# Patient Record
Sex: Female | Born: 1975 | Race: Black or African American | Hispanic: No | Marital: Married | Smoking: Heavy tobacco smoker
Health system: Southern US, Community
[De-identification: ages and names within clinical notes are randomized; demographics above are authoritative.]

## PROBLEM LIST (undated history)

## (undated) DIAGNOSIS — R87629 Unspecified abnormal cytological findings in specimens from vagina: Secondary | ICD-10-CM

## (undated) DIAGNOSIS — I10 Essential (primary) hypertension: Secondary | ICD-10-CM

## (undated) DIAGNOSIS — E785 Hyperlipidemia, unspecified: Secondary | ICD-10-CM

## (undated) DIAGNOSIS — E079 Disorder of thyroid, unspecified: Secondary | ICD-10-CM

## (undated) DIAGNOSIS — N179 Acute kidney failure, unspecified: Secondary | ICD-10-CM

## (undated) DIAGNOSIS — F419 Anxiety disorder, unspecified: Secondary | ICD-10-CM

## (undated) HISTORY — DX: Unspecified abnormal cytological findings in specimens from vagina: R87.629

## (undated) HISTORY — DX: Disorder of thyroid, unspecified: E07.9

## (undated) HISTORY — PX: ECTOPIC PREGNANCY SURGERY: SHX613

## (undated) HISTORY — PX: LEEP: SHX91

## (undated) HISTORY — DX: Anxiety disorder, unspecified: F41.9

## (undated) HISTORY — DX: Essential (primary) hypertension: I10

---

## 2009-11-03 ENCOUNTER — Ambulatory Visit: Payer: Self-pay | Admitting: Diagnostic Radiology

## 2009-11-03 ENCOUNTER — Emergency Department (HOSPITAL_BASED_OUTPATIENT_CLINIC_OR_DEPARTMENT_OTHER): Admission: EM | Admit: 2009-11-03 | Discharge: 2009-11-03 | Payer: Self-pay | Admitting: Emergency Medicine

## 2010-03-24 LAB — URINALYSIS, ROUTINE W REFLEX MICROSCOPIC
Glucose, UA: 100 mg/dL — AB
Specific Gravity, Urine: 1.005 (ref 1.005–1.030)
pH: 6 (ref 5.0–8.0)

## 2010-03-24 LAB — URINE MICROSCOPIC-ADD ON

## 2010-03-24 LAB — CBC
Platelets: 363 10*3/uL (ref 150–400)
RBC: 3.74 MIL/uL — ABNORMAL LOW (ref 3.87–5.11)
RDW: 12.3 % (ref 11.5–15.5)
WBC: 9.9 10*3/uL (ref 4.0–10.5)

## 2010-03-24 LAB — DIFFERENTIAL
Basophils Absolute: 0.1 10*3/uL (ref 0.0–0.1)
Eosinophils Relative: 2 % (ref 0–5)
Lymphocytes Relative: 20 % (ref 12–46)
Lymphs Abs: 2 10*3/uL (ref 0.7–4.0)
Neutrophils Relative %: 69 % (ref 43–77)

## 2010-03-24 LAB — ABO/RH: ABO/RH(D): A POS

## 2010-03-24 LAB — HCG, QUANTITATIVE, PREGNANCY: hCG, Beta Chain, Quant, S: 12385 m[IU]/mL — ABNORMAL HIGH (ref <5)

## 2012-04-19 ENCOUNTER — Emergency Department (HOSPITAL_BASED_OUTPATIENT_CLINIC_OR_DEPARTMENT_OTHER)
Admission: EM | Admit: 2012-04-19 | Discharge: 2012-04-19 | Disposition: A | Discharge status: Home / Self Care | Payer: PRIVATE HEALTH INSURANCE | Attending: Emergency Medicine | Admitting: Emergency Medicine

## 2012-04-19 ENCOUNTER — Emergency Department (HOSPITAL_BASED_OUTPATIENT_CLINIC_OR_DEPARTMENT_OTHER): Payer: PRIVATE HEALTH INSURANCE

## 2012-04-19 ENCOUNTER — Encounter (HOSPITAL_BASED_OUTPATIENT_CLINIC_OR_DEPARTMENT_OTHER): Payer: Self-pay | Admitting: Emergency Medicine

## 2012-04-19 DIAGNOSIS — F17200 Nicotine dependence, unspecified, uncomplicated: Secondary | ICD-10-CM | POA: Insufficient documentation

## 2012-04-19 DIAGNOSIS — R071 Chest pain on breathing: Secondary | ICD-10-CM | POA: Insufficient documentation

## 2012-04-19 DIAGNOSIS — Z3202 Encounter for pregnancy test, result negative: Secondary | ICD-10-CM | POA: Insufficient documentation

## 2012-04-19 DIAGNOSIS — N939 Abnormal uterine and vaginal bleeding, unspecified: Secondary | ICD-10-CM | POA: Insufficient documentation

## 2012-04-19 DIAGNOSIS — R0789 Other chest pain: Secondary | ICD-10-CM

## 2012-04-19 DIAGNOSIS — N926 Irregular menstruation, unspecified: Secondary | ICD-10-CM | POA: Insufficient documentation

## 2012-04-19 DIAGNOSIS — R42 Dizziness and giddiness: Secondary | ICD-10-CM | POA: Insufficient documentation

## 2012-04-19 DIAGNOSIS — N898 Other specified noninflammatory disorders of vagina: Secondary | ICD-10-CM | POA: Insufficient documentation

## 2012-04-19 LAB — CBC WITH DIFFERENTIAL/PLATELET
Basophils Absolute: 0 K/uL (ref 0.0–0.1)
Basophils Relative: 0 % (ref 0–1)
Eosinophils Absolute: 0.2 K/uL (ref 0.0–0.7)
Eosinophils Relative: 3 % (ref 0–5)
HCT: 38 % (ref 36.0–46.0)
Hemoglobin: 13.7 g/dL (ref 12.0–15.0)
Lymphocytes Relative: 33 % (ref 12–46)
Lymphs Abs: 2.6 K/uL (ref 0.7–4.0)
MCH: 34.9 pg — ABNORMAL HIGH (ref 26.0–34.0)
MCHC: 36.1 g/dL — ABNORMAL HIGH (ref 30.0–36.0)
MCV: 96.7 fL (ref 78.0–100.0)
Monocytes Absolute: 0.7 K/uL (ref 0.1–1.0)
Monocytes Relative: 9 % (ref 3–12)
Neutro Abs: 4.5 K/uL (ref 1.7–7.7)
Neutrophils Relative %: 56 % (ref 43–77)
Platelets: 332 K/uL (ref 150–400)
RBC: 3.93 MIL/uL (ref 3.87–5.11)
RDW: 12.7 % (ref 11.5–15.5)
WBC: 8 K/uL (ref 4.0–10.5)

## 2012-04-19 LAB — COMPREHENSIVE METABOLIC PANEL WITH GFR
ALT: 31 U/L (ref 0–35)
AST: 30 U/L (ref 0–37)
Albumin: 4.3 g/dL (ref 3.5–5.2)
Alkaline Phosphatase: 39 U/L (ref 39–117)
BUN: 11 mg/dL (ref 6–23)
CO2: 22 meq/L (ref 19–32)
Calcium: 9.6 mg/dL (ref 8.4–10.5)
Chloride: 105 meq/L (ref 96–112)
Creatinine, Ser: 0.7 mg/dL (ref 0.50–1.10)
GFR calc Af Amer: >90 mL/min
GFR calc non Af Amer: >90 mL/min
Glucose, Bld: 98 mg/dL (ref 70–99)
Potassium: 3.9 meq/L (ref 3.5–5.1)
Sodium: 140 meq/L (ref 135–145)
Total Bilirubin: 0.4 mg/dL (ref 0.3–1.2)
Total Protein: 7.7 g/dL (ref 6.0–8.3)

## 2012-04-19 LAB — URINALYSIS, ROUTINE W REFLEX MICROSCOPIC
Bilirubin Urine: NEGATIVE
Glucose, UA: NEGATIVE mg/dL
Ketones, ur: NEGATIVE mg/dL
Leukocytes, UA: NEGATIVE
Nitrite: NEGATIVE
Protein, ur: NEGATIVE mg/dL
Specific Gravity, Urine: 1.011 (ref 1.005–1.030)
Urobilinogen, UA: 0.2 mg/dL (ref 0.0–1.0)
pH: 5 (ref 5.0–8.0)

## 2012-04-19 LAB — URINE MICROSCOPIC-ADD ON

## 2012-04-19 LAB — TROPONIN I: Troponin I: <0.3 ng/mL

## 2012-04-19 LAB — PREGNANCY, URINE: Preg Test, Ur: NEGATIVE

## 2012-04-19 MED ORDER — SODIUM CHLORIDE 0.9 % IV BOLUS (SEPSIS)
1000.0000 mL | Freq: Once | INTRAVENOUS | Status: AC
  Administered 2012-04-19: 1000 mL via INTRAVENOUS

## 2012-04-19 MED ORDER — IBUPROFEN 600 MG PO TABS: 600.0000 mg | ORAL_TABLET | Freq: Four times a day (QID) | ORAL | Status: DC | PRN

## 2012-04-19 MED ORDER — TRAMADOL HCL 50 MG PO TABS: 50.0000 mg | ORAL_TABLET | Freq: Four times a day (QID) | ORAL | Status: DC | PRN

## 2012-04-19 NOTE — ED Provider Notes (Signed)
History     CSN: [id number]  Arrival date & time 04/19/12  1104   First MD Initiated Contact with Patient 04/19/12 1118      Chief Complaint  Patient presents with  . Chest Pain  . Dizziness    (Consider location/radiation/quality/duration/timing/severity/associated sxs/prior treatment) HPI Pt present with gradual onset L sided chest pain starting yesterday. No trauma or heavy lifting. Pt states pain is worse with palpation of chest and when sleeping on left side. No recent travel or surgery. No lower ext swelling or pain. No fever, chills, SOB, cough. Pt reports dizziness discried as lightheadedness today while at work. Worse when standing up or changing positions. No focal weakness or visual changes. Pt states she has heavy periods and is currently on. She has told she was anemic in the past and has required blood transfusions.  No family or personal history of DvT/PE or MI No past medical history on file.  Past Surgical History  Procedure Laterality Date  . Ectopic pregnancy surgery      No family history on file.  History  Substance Use Topics  . Smoking status: Heavy Tobacco Smoker  . Smokeless tobacco: Not on file  . Alcohol Use: Yes    OB History   Grav Para Term Preterm Abortions TAB SAB Ect Mult Living                  Review of Systems  Constitutional: Negative for fever and chills.  HENT: Negative for neck pain.   Eyes: Negative for visual disturbance.  Respiratory: Negative for cough and shortness of breath.   Cardiovascular: Positive for chest pain. Negative for palpitations and leg swelling.  Gastrointestinal: Negative for nausea, vomiting, abdominal pain, diarrhea and constipation.  Genitourinary: Positive for vaginal bleeding and menstrual problem. Negative for dysuria and frequency.  Musculoskeletal: Positive for myalgias. Negative for back pain.  Skin: Negative for rash and wound.  Neurological: Positive for dizziness and light-headedness. Negative  for syncope, weakness, numbness and headaches.  All other systems reviewed and are negative.    Allergies  Review of patient's allergies indicates no known allergies.  Home Medications   Current Outpatient Rx  Name  Route  Sig  Dispense  Refill  . ibuprofen (ADVIL,MOTRIN) 600 MG tablet   Oral   Take 1 tablet (600 mg total) by mouth every 6 (six) hours as needed for pain.   30 tablet   0   . traMADol (ULTRAM) 50 MG tablet   Oral   Take 1 tablet (50 mg total) by mouth every 6 (six) hours as needed for pain.   15 tablet   0     BP 126/71  Pulse 75  Temp(Src) 98.5 F (36.9 C) (Oral)  Resp 18  Ht 5\' 11"  (1.803 m)  Wt 175 lb (79.379 kg)  BMI 24.42 kg/m2  SpO2 100%  LMP 04/19/2012  Physical Exam  Nursing note and vitals reviewed. Constitutional: She is oriented to person, place, and time. She appears well-developed and well-nourished. No distress.  HENT:  Head: Normocephalic and atraumatic.  Mouth/Throat: Oropharynx is clear and moist.  Eyes: EOM are normal. Pupils are equal, round, and reactive to light.  Neck: Normal range of motion. Neck supple.  Cardiovascular: Normal rate and regular rhythm.   Pulmonary/Chest: Effort normal and breath sounds normal. No respiratory distress. She has no wheezes. She has no rales. She exhibits tenderness (able to replicate Left chest wall tenderness with palpation. No crepitance or deformity. ).  Abdominal: Soft. Bowel sounds are normal. She exhibits no distension and no mass. There is no tenderness. There is no rebound and no guarding.  Musculoskeletal: Normal range of motion. She exhibits no edema and no tenderness.  No calf swelling or tenderness  Neurological: She is alert and oriented to person, place, and time.  Skin: Skin is warm and dry. No rash noted. No erythema.  Psychiatric: She has a normal mood and affect. Her behavior is normal.    ED Course  Procedures (including critical care time)  Labs Reviewed  CBC WITH  DIFFERENTIAL - Abnormal; Notable for the following:    MCH 34.9 (*)    MCHC 36.1 (*)    All other components within normal limits  URINALYSIS, ROUTINE W REFLEX MICROSCOPIC - Abnormal; Notable for the following:    Hgb urine dipstick SMALL (*)    All other components within normal limits  COMPREHENSIVE METABOLIC PANEL  TROPONIN I  PREGNANCY, URINE  URINE MICROSCOPIC-ADD ON  D-DIMER, QUANTITATIVE   Dg Chest 2 View  04/19/2012  *RADIOLOGY REPORT*  Clinical Data: Chest pain.  CHEST - 2 VIEW  Comparison: None.  Findings: Lungs are clear.  Heart size is normal.  No pneumothorax or pleural fluid.  IMPRESSION: Normal chest.   Original Report Authenticated By: Holley Dexter, M.D.      1. Chest wall pain   2. Dizziness      Date: 04/19/2012  Rate: 89  Rhythm: normal sinus rhythm  QRS Axis: normal  Intervals: normal  ST/T Wave abnormalities: normal  Conduction Disutrbances:none  Narrative Interpretation:   Old EKG Reviewed: none available     MDM   Normal workup. Pt states she is feeling better. CP consistent with chest wall strain.        Loren Racer, MD 04/21/12 [phone]

## 2015-01-13 ENCOUNTER — Encounter (HOSPITAL_BASED_OUTPATIENT_CLINIC_OR_DEPARTMENT_OTHER): Payer: Self-pay | Admitting: *Deleted

## 2015-01-13 ENCOUNTER — Emergency Department (HOSPITAL_BASED_OUTPATIENT_CLINIC_OR_DEPARTMENT_OTHER): Payer: PRIVATE HEALTH INSURANCE

## 2015-01-13 ENCOUNTER — Emergency Department (HOSPITAL_BASED_OUTPATIENT_CLINIC_OR_DEPARTMENT_OTHER)
Admission: EM | Admit: 2015-01-13 | Discharge: 2015-01-14 | Disposition: A | Discharge status: Home / Self Care | Payer: PRIVATE HEALTH INSURANCE | Attending: Emergency Medicine | Admitting: Emergency Medicine

## 2015-01-13 DIAGNOSIS — F17210 Nicotine dependence, cigarettes, uncomplicated: Secondary | ICD-10-CM | POA: Insufficient documentation

## 2015-01-13 DIAGNOSIS — I10 Essential (primary) hypertension: Secondary | ICD-10-CM

## 2015-01-13 DIAGNOSIS — Z3202 Encounter for pregnancy test, result negative: Secondary | ICD-10-CM | POA: Diagnosis not present

## 2015-01-13 DIAGNOSIS — R51 Headache: Secondary | ICD-10-CM | POA: Insufficient documentation

## 2015-01-13 DIAGNOSIS — R519 Headache, unspecified: Secondary | ICD-10-CM

## 2015-01-13 LAB — URINALYSIS, ROUTINE W REFLEX MICROSCOPIC
Bilirubin Urine: NEGATIVE
GLUCOSE, UA: NEGATIVE mg/dL
Hgb urine dipstick: NEGATIVE
Ketones, ur: NEGATIVE mg/dL
Nitrite: NEGATIVE
PROTEIN: NEGATIVE mg/dL
SPECIFIC GRAVITY, URINE: 1.002 — AB (ref 1.005–1.030)
pH: 6 (ref 5.0–8.0)

## 2015-01-13 LAB — URINE MICROSCOPIC-ADD ON: RBC / HPF: NONE SEEN RBC/hpf (ref 0–5)

## 2015-01-13 LAB — COMPREHENSIVE METABOLIC PANEL
ALK PHOS: 52 U/L (ref 38–126)
ALT: 44 U/L (ref 14–54)
ANION GAP: 9 (ref 5–15)
AST: 46 U/L — ABNORMAL HIGH (ref 15–41)
Albumin: 4.3 g/dL (ref 3.5–5.0)
BILIRUBIN TOTAL: 1 mg/dL (ref 0.3–1.2)
BUN: 7 mg/dL (ref 6–20)
CALCIUM: 9 mg/dL (ref 8.9–10.3)
CO2: 24 mmol/L (ref 22–32)
CREATININE: 0.75 mg/dL (ref 0.44–1.00)
Chloride: 103 mmol/L (ref 101–111)
GFR calc non Af Amer: >60 mL/min (ref >60)
GLUCOSE: 116 mg/dL — AB (ref 65–99)
Potassium: 3.4 mmol/L — ABNORMAL LOW (ref 3.5–5.1)
Sodium: 136 mmol/L (ref 135–145)
TOTAL PROTEIN: 7.7 g/dL (ref 6.5–8.1)

## 2015-01-13 LAB — CBC WITH DIFFERENTIAL/PLATELET
BASOS PCT: 0 %
Basophils Absolute: 0 10*3/uL (ref 0.0–0.1)
EOS ABS: 0.2 10*3/uL (ref 0.0–0.7)
EOS PCT: 3 %
HCT: 36.9 % (ref 36.0–46.0)
HEMOGLOBIN: 12.6 g/dL (ref 12.0–15.0)
Lymphocytes Relative: 36 %
Lymphs Abs: 2.8 10*3/uL (ref 0.7–4.0)
MCH: 33.9 pg (ref 26.0–34.0)
MCHC: 34.1 g/dL (ref 30.0–36.0)
MCV: 99.2 fL (ref 78.0–100.0)
MONOS PCT: 10 %
Monocytes Absolute: 0.8 10*3/uL (ref 0.1–1.0)
NEUTROS PCT: 51 %
Neutro Abs: 4 10*3/uL (ref 1.7–7.7)
PLATELETS: 299 10*3/uL (ref 150–400)
RBC: 3.72 MIL/uL — ABNORMAL LOW (ref 3.87–5.11)
RDW: 13.6 % (ref 11.5–15.5)
WBC: 7.7 10*3/uL (ref 4.0–10.5)

## 2015-01-13 LAB — TROPONIN I: Troponin I: <0.03 ng/mL (ref <0.031)

## 2015-01-13 LAB — TSH: TSH: 5.69 u[IU]/mL — AB (ref 0.350–4.500)

## 2015-01-13 LAB — PREGNANCY, URINE: PREG TEST UR: NEGATIVE

## 2015-01-13 MED ORDER — HYDRALAZINE HCL 20 MG/ML IJ SOLN
10.0000 mg | Freq: Once | INTRAMUSCULAR | Status: AC
Start: 2015-01-13 — End: 2015-01-13
  Administered 2015-01-13: 10 mg via INTRAVENOUS
  Filled 2015-01-13: qty 1

## 2015-01-13 MED ORDER — LISINOPRIL 10 MG PO TABS
5.0000 mg | ORAL_TABLET | Freq: Once | ORAL | Status: AC
  Administered 2015-01-14: 5 mg via ORAL
  Filled 2015-01-13: qty 1

## 2015-01-13 MED ORDER — LISINOPRIL 10 MG PO TABS: 10.0000 mg | ORAL_TABLET | Freq: Every day | ORAL | Status: DC

## 2015-01-13 NOTE — ED Notes (Signed)
Per Pt. Never has had b/p problems

## 2015-01-13 NOTE — ED Notes (Signed)
Yesterday she had nausea and diarrhea. Headache today. She took her BP at work and it was elevated. No hx of HTN.

## 2015-01-13 NOTE — ED Provider Notes (Addendum)
CSN: [id number]     Arrival date & time 01/13/15  1808 History   By signing my name below, I, Forrestine Him, attest that this documentation has been prepared under the direction and in the presence of Charlesetta Shanks, MD.  Electronically Signed: Forrestine Him, ED Scribe. 01/13/2015. 8:24 PM.   Chief Complaint  Patient presents with  . Headache  . Hypertension   The history is provided by the patient. No language interpreter was used.    HPI Comments: Terri Morris is a 40 y.o. female without any pertinent past medical history who presents to the Emergency Department here for hypertension this evening. Pt reports constant, ongoing HA, diaphoresis, diarrhea, and nausea x 1 day. 4-5 episodes of diarrhea reported in the last 24 hours. Pt states she took her blood pressure earlier today after onset of symptoms with a reading of 167/98. She states she waited 30 minute and took her blood pressure again with a reading of 157/90. Pt states she then waiting another 30 minutes and states her blood pressure reading elevated again. Pt denies any previous history of HTN. She denies any recent fever, chills, chest pain, or shortness of breath.  PCP: No primary care provider on file.    History reviewed. No pertinent past medical history. Past Surgical History  Procedure Laterality Date  . Ectopic pregnancy surgery     No family history on file. Social History  Substance Use Topics  . Smoking status: Heavy Tobacco Smoker -- 1.00 packs/day    Types: Cigarettes  . Smokeless tobacco: None  . Alcohol Use: Yes   OB History    No data available     Review of Systems   A complete 10 system review of systems was obtained and all systems are negative except as noted in the HPI and PMH.    Allergies  Review of patient's allergies indicates no known allergies.  Home Medications   Prior to Admission medications   Medication Sig Start Date End Date Taking? Authorizing Provider  ibuprofen  (ADVIL,MOTRIN) 600 MG tablet Take 1 tablet (600 mg total) by mouth every 6 (six) hours as needed for pain. 04/19/12   Julianne Rice, MD  lisinopril (PRINIVIL,ZESTRIL) 10 MG tablet Take 1 tablet (10 mg total) by mouth daily. 01/13/15   Charlesetta Shanks, MD  traMADol (ULTRAM) 50 MG tablet Take 1 tablet (50 mg total) by mouth every 6 (six) hours as needed for pain. 04/19/12   Julianne Rice, MD   Triage Vitals: BP 135/76 mmHg  Pulse 98  Temp(Src) 98.7 F (37.1 C) (Oral)  Resp 20  Ht 5\' 11"  (1.803 m)  Wt 175 lb (79.379 kg)  BMI 24.42 kg/m2  SpO2 98%  LMP 12/28/2014   Physical Exam  Constitutional: She is oriented to person, place, and time. She appears well-developed and well-nourished.  HENT:  Head: Normocephalic and atraumatic.  Mouth/Throat: Oropharynx is clear and moist.  Eyes: EOM are normal. Pupils are equal, round, and reactive to light.  Neck: Normal range of motion. No thyromegaly present.  Cardiovascular: Normal rate, regular rhythm, normal heart sounds and intact distal pulses.   Pulmonary/Chest: Effort normal and breath sounds normal.  Abdominal: Soft. Bowel sounds are normal. She exhibits no distension. There is no tenderness.  Musculoskeletal: Normal range of motion. She exhibits no edema or tenderness.  Neurological: She is alert and oriented to person, place, and time. No cranial nerve deficit. She exhibits normal muscle tone. Coordination normal.  Skin: Skin is warm and dry.  Psychiatric: She has a normal mood and affect.  Nursing note and vitals reviewed.   ED Course  Procedures (including critical care time)  DIAGNOSTIC STUDIES: Oxygen Saturation is 99% on RA, Normal by my interpretation.    COORDINATION OF CARE: 8:20 PM- Will order urinalysis. Discussed treatment plan with pt at bedside and pt agreed to plan.     Labs Review Labs Reviewed  URINALYSIS, ROUTINE W REFLEX MICROSCOPIC (NOT AT [facility]) - Abnormal; Notable for the following:    Specific Gravity, Urine  1.002 (*)    Leukocytes, UA SMALL (*)    All other components within normal limits  URINE MICROSCOPIC-ADD ON - Abnormal; Notable for the following:    Squamous Epithelial / LPF 0-5 (*)    Bacteria, UA RARE (*)    All other components within normal limits  COMPREHENSIVE METABOLIC PANEL - Abnormal; Notable for the following:    Potassium 3.4 (*)    Glucose, Bld 116 (*)    AST 46 (*)    All other components within normal limits  CBC WITH DIFFERENTIAL/PLATELET - Abnormal; Notable for the following:    RBC 3.72 (*)    All other components within normal limits  TSH - Abnormal; Notable for the following:    TSH 5.690 (*)    All other components within normal limits  TROPONIN I  PREGNANCY, URINE  T3, FREE    Imaging Review Dg Chest 2 View  01/13/2015  CLINICAL DATA:  Chest pain.  Smoking history EXAM: CHEST  2 VIEW COMPARISON:  Th 04/19/2012 FINDINGS: Normal mediastinum and cardiac silhouette. Normal pulmonary vasculature. No evidence of effusion, infiltrate, or pneumothorax. No acute bony abnormality. IMPRESSION: No acute cardiopulmonary process. Electronically Signed   By: Suzy Bouchard M.D.   On: 01/13/2015 21:31   Ct Head Wo Contrast  01/13/2015  CLINICAL DATA:  Headache.  Hypertension and dizziness today. EXAM: CT HEAD WITHOUT CONTRAST TECHNIQUE: Contiguous axial images were obtained from the base of the skull through the vertex without intravenous contrast. COMPARISON:  None. FINDINGS: No intracranial hemorrhage, mass effect, or midline shift. No hydrocephalus. The basilar cisterns are patent. No evidence of territorial infarct. No intracranial fluid collection. Calvarium is intact. Included paranasal sinuses and mastoid air cells are well aerated. IMPRESSION: No acute intracranial abnormality. Electronically Signed   By: Jeb Levering M.D.   On: 01/13/2015 21:44   I have personally reviewed and evaluated these images and lab results as part of my medical decision-making.   EKG  Interpretation None      MDM   Final diagnoses:  Acute nonintractable headache, unspecified headache type  Essential hypertension   Patient presents with headache and blood pressure that she has been measuring his elevated. Patient has no neurologic deficit. She does not have chest pain or signs of acute end organ damage. CT has negative. Patient will be started on a low dose of lisinopril with instructions for close follow-up. He is also noted to have elevated TSH. At this time there may be some hypothyroidism but that does not appear to be acutely symptomatic. Patient is instructed to follow-up for further evaluation.  Charlesetta Shanks, MD 01/13/15 [id number]  Charlesetta Shanks, MD 01/14/15 [phone]

## 2015-01-13 NOTE — Discharge Instructions (Signed)
Hypertension Hypertension, commonly called high blood pressure, is when the force of blood pumping through your arteries is too strong. Your arteries are the blood vessels that carry blood from your heart throughout your body. A blood pressure reading consists of a higher number over a lower number, such as 110/72. The higher number (systolic) is the pressure inside your arteries when your heart pumps. The lower number (diastolic) is the pressure inside your arteries when your heart relaxes. Ideally you want your blood pressure below 120/80. Hypertension forces your heart to work harder to pump blood. Your arteries may become narrow or stiff. Having untreated or uncontrolled hypertension can cause heart attack, stroke, kidney disease, and other problems. RISK FACTORS Some risk factors for high blood pressure are controllable. Others are not.  Risk factors you cannot control include:   Race. You may be at higher risk if you are African American.  Age. Risk increases with age.  Gender. Men are at higher risk than women before age 45 years. After age 65, women are at higher risk than men. Risk factors you can control include:  Not getting enough exercise or physical activity.  Being overweight.  Getting too much fat, sugar, calories, or salt in your diet.  Drinking too much alcohol. SIGNS AND SYMPTOMS Hypertension does not usually cause signs or symptoms. Extremely high blood pressure (hypertensive crisis) may cause headache, anxiety, shortness of breath, and nosebleed. DIAGNOSIS To check if you have hypertension, your health care provider will measure your blood pressure while you are seated, with your arm held at the level of your heart. It should be measured at least twice using the same arm. Certain conditions can cause a difference in blood pressure between your right and left arms. A blood pressure reading that is higher than normal on one occasion does not mean that you need treatment. If  it is not clear whether you have high blood pressure, you may be asked to return on a different day to have your blood pressure checked again. Or, you may be asked to monitor your blood pressure at home for 1 or more weeks. TREATMENT Treating high blood pressure includes making lifestyle changes and possibly taking medicine. Living a healthy lifestyle can help lower high blood pressure. You may need to change some of your habits. Lifestyle changes may include:  Following the DASH diet. This diet is high in fruits, vegetables, and whole grains. It is low in salt, red meat, and added sugars.  Keep your sodium intake below 2,300 mg per day.  Getting at least 30-45 minutes of aerobic exercise at least 4 times per week.  Losing weight if necessary.  Not smoking.  Limiting alcoholic beverages.  Learning ways to reduce stress. Your health care provider may prescribe medicine if lifestyle changes are not enough to get your blood pressure under control, and if one of the following is true:  You are 18-59 years of age and your systolic blood pressure is above 140.  You are 60 years of age or older, and your systolic blood pressure is above 150.  Your diastolic blood pressure is above 90.  You have diabetes, and your systolic blood pressure is over 140 or your diastolic blood pressure is over 90.  You have kidney disease and your blood pressure is above 140/90.  You have heart disease and your blood pressure is above 140/90. Your personal target blood pressure may vary depending on your medical conditions, your age, and other factors. HOME CARE INSTRUCTIONS    Have your blood pressure rechecked as directed by your health care provider.   Take medicines only as directed by your health care provider. Follow the directions carefully. Blood pressure medicines must be taken as prescribed. The medicine does not work as well when you skip doses. Skipping doses also puts you at risk for  problems.  Do not smoke.   Monitor your blood pressure at home as directed by your health care provider. SEEK MEDICAL CARE IF:   You think you are having a reaction to medicines taken.  You have recurrent headaches or feel dizzy.  You have swelling in your ankles.  You have trouble with your vision. SEEK IMMEDIATE MEDICAL CARE IF:  You develop a severe headache or confusion.  You have unusual weakness, numbness, or feel faint.  You have severe chest or abdominal pain.  You vomit repeatedly.  You have trouble breathing. MAKE SURE YOU:   Understand these instructions.  Will watch your condition.  Will get help right away if you are not doing well or get worse.   This information is not intended to replace advice given to you by your health care provider. Make sure you discuss any questions you have with your health care provider.   Document Released: 12/27/2004 Document Revised: 05/13/2014 Document Reviewed: 10/19/2012 Elsevier Interactive Patient Education 2016 Else[employer] Possible Hypothyroidism  You will need further testing for your thyroid. You need to be seen by a family doctor and an endocrinologist. Hypothyroidism is a disorder of the thyroid. The thyroid is a large gland that is located in the lower front of the neck. The thyroid releases hormones that control how the body works. With hypothyroidism, the thyroid does not make enough of these hormones. CAUSES Causes of hypothyroidism may include:  Viral infections.  Pregnancy.  Your own defense system (immune system) attacking your thyroid.  Certain medicines.  Birth defects.  Past radiation treatments to your head or neck.  Past treatment with radioactive iodine.  Past surgical removal of part or all of your thyroid.  Problems with the gland that is located in the center of your brain (pituitary). SIGNS AND SYMPTOMS Signs and symptoms of hypothyroidism may include:  Feeling as though you have  no energy (lethargy).  Inability to tolerate cold.  Weight gain that is not explained by a change in diet or exercise habits.  Dry skin.  Coarse hair.  Menstrual irregularity.  Slowing of thought processes.  Constipation.  Sadness or depression. DIAGNOSIS  Your health care provider may diagnose hypothyroidism with blood tests and ultrasound tests. TREATMENT Hypothyroidism is treated with medicine that replaces the hormones that your body does not make. After you begin treatment, it may take several weeks for symptoms to go away. HOME CARE INSTRUCTIONS   Take medicines only as directed by your health care provider.  If you start taking any new medicines, tell your health care provider.  Keep all follow-up visits as directed by your health care provider. This is important. As your condition improves, your dosage needs may change. You will need to have blood tests regularly so that your health care provider can watch your condition. SEEK MEDICAL CARE IF:  Your symptoms do not get better with treatment.  You are taking thyroid replacement medicine and:  You sweat excessively.  You have tremors.  You feel anxious.  You lose weight rapidly.  You cannot tolerate heat.  You have emotional swings.  You have diarrhea.  You feel weak. [place]  IF:   You develop chest pain.  You develop an irregular heartbeat.  You develop a rapid heartbeat.   This information is not intended to replace advice given to you by your health care provider. Make sure you discuss any questions you have with your health care provider.   Document Released: 12/27/2004 Document Revised: 01/17/2014 Document Reviewed: 05/14/2013 Elsevier Interactive Patient Education [employer].

## 2015-01-16 LAB — T3, FREE: T3, Free: UNDETERMINED pg/mL

## 2015-01-19 ENCOUNTER — Encounter: Payer: Self-pay | Admitting: Medical

## 2015-01-19 ENCOUNTER — Ambulatory Visit (INDEPENDENT_AMBULATORY_CARE_PROVIDER_SITE_OTHER): Payer: PRIVATE HEALTH INSURANCE | Admitting: Medical

## 2015-01-19 VITALS — BP 130/90 | HR 78 | Temp 98.0°F | Ht 71.0 in | Wt 192.0 lb

## 2015-01-19 DIAGNOSIS — R739 Hyperglycemia, unspecified: Secondary | ICD-10-CM | POA: Diagnosis not present

## 2015-01-19 DIAGNOSIS — R946 Abnormal results of thyroid function studies: Secondary | ICD-10-CM

## 2015-01-19 DIAGNOSIS — I10 Essential (primary) hypertension: Secondary | ICD-10-CM | POA: Diagnosis not present

## 2015-01-19 DIAGNOSIS — Z720 Tobacco use: Secondary | ICD-10-CM | POA: Diagnosis not present

## 2015-01-19 DIAGNOSIS — R7989 Other specified abnormal findings of blood chemistry: Secondary | ICD-10-CM | POA: Insufficient documentation

## 2015-01-19 DIAGNOSIS — F411 Generalized anxiety disorder: Secondary | ICD-10-CM | POA: Diagnosis not present

## 2015-01-19 DIAGNOSIS — F17200 Nicotine dependence, unspecified, uncomplicated: Secondary | ICD-10-CM

## 2015-01-19 LAB — COMPREHENSIVE METABOLIC PANEL
ALBUMIN: 4.6 g/dL (ref 3.5–5.2)
ALT: 38 U/L — AB (ref 0–35)
AST: 32 U/L (ref 0–37)
Alkaline Phosphatase: 46 U/L (ref 39–117)
BILIRUBIN TOTAL: 0.3 mg/dL (ref 0.2–1.2)
BUN: 9 mg/dL (ref 6–23)
CALCIUM: 9.1 mg/dL (ref 8.4–10.5)
CO2: 26 mEq/L (ref 19–32)
CREATININE: 0.76 mg/dL (ref 0.40–1.20)
Chloride: 103 mEq/L (ref 96–112)
GFR: 107.6 mL/min (ref >60.00)
Glucose, Bld: 90 mg/dL (ref 70–99)
Potassium: 3.3 mEq/L — ABNORMAL LOW (ref 3.5–5.1)
Sodium: 136 mEq/L (ref 135–145)
Total Protein: 7.5 g/dL (ref 6.0–8.3)

## 2015-01-19 LAB — HEMOGLOBIN A1C: HEMOGLOBIN A1C: 5.8 % (ref 4.6–6.5)

## 2015-01-19 LAB — TSH: TSH: 2.77 u[IU]/mL (ref 0.35–4.50)
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
## 2015-01-19 NOTE — Progress Notes (Signed)
Pre visit review using our clinic review tool, if applicable. No additional management support is needed unless otherwise documented below in the visit note. 

## 2015-01-19 NOTE — Patient Instructions (Addendum)
Will get cmp, tsh and a1-c today.  Continue lisinopril.(check bp 2-3 times a week at home)  Consider use of wellbutrin in near future to stop smoking.  If any daily anxiety consider SSRI type med.(but may try wellbutrin first)  If tsh still high will rx low dose thyroid med.  Follow a1-c. Recommend diet and exercise.  Follow up 2 wks fasting cpe.(assess cardiac risk factors and get lipid panel)

## 2015-01-20 ENCOUNTER — Encounter: Payer: Self-pay | Admitting: Medical

## 2015-01-22 DIAGNOSIS — E876 Hypokalemia: Secondary | ICD-10-CM

## 2015-01-23 MED ORDER — POTASSIUM CHLORIDE CRYS ER 10 MEQ PO TBCR: EXTENDED_RELEASE_TABLET | ORAL | Status: DC

## 2015-01-23 NOTE — Telephone Encounter (Signed)
rx kdur and cmp placed. Please notify pt. Sent my chart message. Get cmp recheck k level in 2 wks.

## 2015-01-23 NOTE — Telephone Encounter (Signed)
MyChart message sent to pt and left detailed message that Rx of potassium was sent to the pharmacy for the pt. Advised pt to call back with any questions.

## 2015-02-02 ENCOUNTER — Other Ambulatory Visit (HOSPITAL_COMMUNITY)
Admission: RE | Admit: 2015-02-02 | Discharge: 2015-02-02 | Disposition: A | Discharge status: Home / Self Care | Payer: PRIVATE HEALTH INSURANCE | Source: Ambulatory Visit | Attending: Medical | Admitting: Medical

## 2015-02-02 ENCOUNTER — Encounter: Payer: Self-pay | Admitting: Medical

## 2015-02-02 ENCOUNTER — Ambulatory Visit (INDEPENDENT_AMBULATORY_CARE_PROVIDER_SITE_OTHER): Payer: PRIVATE HEALTH INSURANCE | Admitting: Medical

## 2015-02-02 VITALS — BP 122/86 | HR 80 | Temp 98.3°F | Ht 71.0 in | Wt 194.6 lb

## 2015-02-02 DIAGNOSIS — Z1231 Encounter for screening mammogram for malignant neoplasm of breast: Secondary | ICD-10-CM | POA: Diagnosis not present

## 2015-02-02 DIAGNOSIS — Z113 Encounter for screening for infections with a predominantly sexual mode of transmission: Secondary | ICD-10-CM | POA: Diagnosis not present

## 2015-02-02 DIAGNOSIS — Z0000 Encounter for general adult medical examination without abnormal findings: Secondary | ICD-10-CM

## 2015-02-02 DIAGNOSIS — Z124 Encounter for screening for malignant neoplasm of cervix: Secondary | ICD-10-CM

## 2015-02-02 DIAGNOSIS — Z01411 Encounter for gynecological examination (general) (routine) with abnormal findings: Secondary | ICD-10-CM | POA: Diagnosis present

## 2015-02-02 DIAGNOSIS — Z23 Encounter for immunization: Secondary | ICD-10-CM

## 2015-02-02 DIAGNOSIS — R7989 Other specified abnormal findings of blood chemistry: Secondary | ICD-10-CM | POA: Diagnosis not present

## 2015-02-02 DIAGNOSIS — N760 Acute vaginitis: Secondary | ICD-10-CM | POA: Diagnosis present

## 2015-02-02 LAB — LDL CHOLESTEROL, DIRECT: Direct LDL: 96 mg/dL

## 2015-02-02 LAB — POC URINALSYSI DIPSTICK (AUTOMATED)
BILIRUBIN UA: NEGATIVE
GLUCOSE UA: NEGATIVE
Ketones, UA: NEGATIVE
Leukocytes, UA: NEGATIVE
NITRITE UA: NEGATIVE
PH UA: 6
Protein, UA: NEGATIVE
SPEC GRAV UA: 1.025
UROBILINOGEN UA: 0.2

## 2015-02-02 LAB — COMPREHENSIVE METABOLIC PANEL
ALBUMIN: 4.2 g/dL (ref 3.5–5.2)
ALK PHOS: 49 U/L (ref 39–117)
ALT: 36 U/L — AB (ref 0–35)
AST: 31 U/L (ref 0–37)
BILIRUBIN TOTAL: 0.3 mg/dL (ref 0.2–1.2)
BUN: 9 mg/dL (ref 6–23)
CALCIUM: 9.1 mg/dL (ref 8.4–10.5)
CO2: 28 mEq/L (ref 19–32)
Chloride: 102 mEq/L (ref 96–112)
Creatinine, Ser: 0.81 mg/dL (ref 0.40–1.20)
GFR: 99.96 mL/min (ref >60.00)
GLUCOSE: 103 mg/dL — AB (ref 70–99)
POTASSIUM: 3.9 meq/L (ref 3.5–5.1)
Sodium: 138 mEq/L (ref 135–145)
TOTAL PROTEIN: 7.4 g/dL (ref 6.0–8.3)

## 2015-02-02 LAB — LIPID PANEL
CHOLESTEROL: 200 mg/dL (ref 0–200)
HDL: 51 mg/dL (ref >39.00)
NONHDL: 148.7
TRIGLYCERIDES: 282 mg/dL — AB (ref 0.0–149.0)
Total CHOL/HDL Ratio: 4
VLDL: 56.4 mg/dL — ABNORMAL HIGH (ref 0.0–40.0)

## 2015-02-02 MED ORDER — LISINOPRIL 10 MG PO TABS: 10.0000 mg | ORAL_TABLET | Freq: Every day | ORAL | Status: DC

## 2015-02-02 NOTE — Patient Instructions (Addendum)
Wellness examination Place order for mammogram, papsmear, lipid panel and bp will get tdap today.   Other labs done on most recent visit. Mammogram order placed.     Preventive Care for Adults, Female A healthy lifestyle and preventive care can promote health and wellness. Preventive health guidelines for women include the following key practices.  A routine yearly physical is a good way to check with your health care provider about your health and preventive screening. It is a chance to share any concerns and updates on your health and to receive a thorough exam.  Visit your dentist for a routine exam and preventive care every 6 months. Brush your teeth twice a day and floss once a day. Good oral hygiene prevents tooth decay and gum disease.  The frequency of eye exams is based on your age, health, family medical history, use of contact lenses, and other factors. Follow your health care provider's recommendations for frequency of eye exams.  Eat a healthy diet. Foods like vegetables, fruits, whole grains, low-fat dairy products, and lean protein foods contain the nutrients you need without too many calories. Decrease your intake of foods high in solid fats, added sugars, and salt. Eat the right amount of calories for you.Get information about a proper diet from your health care provider, if necessary.  Regular physical exercise is one of the most important things you can do for your health. Most adults should get at least 150 minutes of moderate-intensity exercise (any activity that increases your heart rate and causes you to sweat) each week. In addition, most adults need muscle-strengthening exercises on 2 or more days a week.  Maintain a healthy weight. The body mass index (BMI) is a screening tool to identify possible weight problems. It provides an estimate of body fat based on height and weight. Your health care provider can find your BMI and can help you achieve or maintain a healthy  weight.For adults 20 years and older:  A BMI below 18.5 is considered underweight.  A BMI of 18.5 to 24.9 is normal.  A BMI of 25 to 29.9 is considered overweight.  A BMI of 30 and above is considered obese.  Maintain normal blood lipids and cholesterol levels by exercising and minimizing your intake of saturated fat. Eat a balanced diet with plenty of fruit and vegetables. Blood tests for lipids and cholesterol should begin at age 72 and be repeated every 5 years. If your lipid or cholesterol levels are high, you are over 50, or you are at high risk for heart disease, you may need your cholesterol levels checked more frequently.Ongoing high lipid and cholesterol levels should be treated with medicines if diet and exercise are not working.  If you smoke, find out from your health care provider how to quit. If you do not use tobacco, do not start.  Lung cancer screening is recommended for adults aged 11-80 years who are at high risk for developing lung cancer because of a history of smoking. A yearly low-dose CT scan of the lungs is recommended for people who have at least a 30-pack-year history of smoking and are a current smoker or have quit within the past 15 years. A pack year of smoking is smoking an average of 1 pack of cigarettes a day for 1 year (for example: 1 pack a day for 30 years or 2 packs a day for 15 years). Yearly screening should continue until the smoker has stopped smoking for at least 15 years. Yearly screening  should be stopped for people who develop a health problem that would prevent them from having lung cancer treatment.  If you are pregnant, do not drink alcohol. If you are breastfeeding, be very cautious about drinking alcohol. If you are not pregnant and choose to drink alcohol, do not have more than 1 drink per day. One drink is considered to be 12 ounces (355 mL) of beer, 5 ounces (148 mL) of wine, or 1.5 ounces (44 mL) of liquor.  Avoid use of street drugs. Do not  share needles with anyone. Ask for help if you need support or instructions about stopping the use of drugs.  High blood pressure causes heart disease and increases the risk of stroke. Your blood pressure should be checked at least every 1 to 2 years. Ongoing high blood pressure should be treated with medicines if weight loss and exercise do not work.  If you are 68-7 years old, ask your health care provider if you should take aspirin to prevent strokes.  Diabetes screening is done by taking a blood sample to check your blood glucose level after you have not eaten for a certain period of time (fasting). If you are not overweight and you do not have risk factors for diabetes, you should be screened once every 3 years starting at age 31. If you are overweight or obese and you are 70-23 years of age, you should be screened for diabetes every year as part of your cardiovascular risk assessment.  Breast cancer screening is essential preventive care for women. You should practice "breast self-awareness." This means understanding the normal appearance and feel of your breasts and may include breast self-examination. Any changes detected, no matter how small, should be reported to a health care provider. Women in their 58s and 30s should have a clinical breast exam (CBE) by a health care provider as part of a regular health exam every 1 to 3 years. After age 77, women should have a CBE every year. Starting at age 31, women should consider having a mammogram (breast X-ray test) every year. Women who have a family history of breast cancer should talk to their health care provider about genetic screening. Women at a high risk of breast cancer should talk to their health care providers about having an MRI and a mammogram every year.  Breast cancer gene (BRCA)-related cancer risk assessment is recommended for women who have family members with BRCA-related cancers. BRCA-related cancers include breast, ovarian, tubal,  and peritoneal cancers. Having family members with these cancers may be associated with an increased risk for harmful changes (mutations) in the breast cancer genes BRCA1 and BRCA2. Results of the assessment will determine the need for genetic counseling and BRCA1 and BRCA2 testing.  Your health care provider may recommend that you be screened regularly for cancer of the pelvic organs (ovaries, uterus, and vagina). This screening involves a pelvic examination, including checking for microscopic changes to the surface of your cervix (Pap test). You may be encouraged to have this screening done every 3 years, beginning at age 41.  For women ages 67-65, health care providers may recommend pelvic exams and Pap testing every 3 years, or they may recommend the Pap and pelvic exam, combined with testing for human papilloma virus (HPV), every 5 years. Some types of HPV increase your risk of cervical cancer. Testing for HPV may also be done on women of any age with unclear Pap test results.  Other health care providers may not recommend any  screening for nonpregnant women who are considered low risk for pelvic cancer and who do not have symptoms. Ask your health care provider if a screening pelvic exam is right for you.  If you have had past treatment for cervical cancer or a condition that could lead to cancer, you need Pap tests and screening for cancer for at least 20 years after your treatment. If Pap tests have been discontinued, your risk factors (such as having a new sexual partner) need to be reassessed to determine if screening should resume. Some women have medical problems that increase the chance of getting cervical cancer. In these cases, your health care provider may recommend more frequent screening and Pap tests.  Colorectal cancer can be detected and often prevented. Most routine colorectal cancer screening begins at the age of 18 years and continues through age 20 years. However, your health care  provider may recommend screening at an earlier age if you have risk factors for colon cancer. On a yearly basis, your health care provider may provide home test kits to check for hidden blood in the stool. Use of a small camera at the end of a tube, to directly examine the colon (sigmoidoscopy or colonoscopy), can detect the earliest forms of colorectal cancer. Talk to your health care provider about this at age 79, when routine screening begins. Direct exam of the colon should be repeated every 5-10 years through age 22 years, unless early forms of precancerous polyps or small growths are found.  People who are at an increased risk for hepatitis B should be screened for this virus. You are considered at high risk for hepatitis B if:  You were born in a country where hepatitis B occurs often. Talk with your health care provider about which countries are considered high risk.  Your parents were born in a high-risk country and you have not received a shot to protect against hepatitis B (hepatitis B vaccine).  You have HIV or AIDS.  You use needles to inject street drugs.  You live with, or have sex with, someone who has hepatitis B.  You get hemodialysis treatment.  You take certain medicines for conditions like cancer, organ transplantation, and autoimmune conditions.  Hepatitis C blood testing is recommended for all people born from [birth year] through 1965 and any individual with known risks for hepatitis C.  Practice safe sex. Use condoms and avoid high-risk sexual practices to reduce the spread of sexually transmitted infections (STIs). STIs include gonorrhea, chlamydia, syphilis, trichomonas, herpes, HPV, and human immunodeficiency virus (HIV). Herpes, HIV, and HPV are viral illnesses that have no cure. They can result in disability, cancer, and death.  You should be screened for sexually transmitted illnesses (STIs) including gonorrhea and chlamydia if:  You are sexually active and are younger  than 24 years.  You are older than 24 years and your health care provider tells you that you are at risk for this type of infection.  Your sexual activity has changed since you were last screened and you are at an increased risk for chlamydia or gonorrhea. Ask your health care provider if you are at risk.  If you are at risk of being infected with HIV, it is recommended that you take a prescription medicine daily to prevent HIV infection. This is called preexposure prophylaxis (PrEP). You are considered at risk if:  You are sexually active and do not regularly use condoms or know the HIV status of your partner(s).  You take drugs by injection.  You are sexually active with a partner who has HIV.  Talk with your health care provider about whether you are at high risk of being infected with HIV. If you choose to begin PrEP, you should first be tested for HIV. You should then be tested every 3 months for as long as you are taking PrEP.  Osteoporosis is a disease in which the bones lose minerals and strength with aging. This can result in serious bone fractures or breaks. The risk of osteoporosis can be identified using a bone density scan. Women ages 68 years and over and women at risk for fractures or osteoporosis should discuss screening with their health care providers. Ask your health care provider whether you should take a calcium supplement or vitamin D to reduce the rate of osteoporosis.  Menopause can be associated with physical symptoms and risks. Hormone replacement therapy is available to decrease symptoms and risks. You should talk to your health care provider about whether hormone replacement therapy is right for you.  Use sunscreen. Apply sunscreen liberally and repeatedly throughout the day. You should seek shade when your shadow is shorter than you. Protect yourself by wearing long sleeves, pants, a wide-brimmed hat, and sunglasses year round, whenever you are outdoors.  Once a  month, do a whole body skin exam, using a mirror to look at the skin on your back. Tell your health care provider of new moles, moles that have irregular borders, moles that are larger than a pencil eraser, or moles that have changed in shape or color.  Stay current with required vaccines (immunizations).  Influenza vaccine. All adults should be immunized every year.  Tetanus, diphtheria, and acellular pertussis (Td, Tdap) vaccine. Pregnant women should receive 1 dose of Tdap vaccine during each pregnancy. The dose should be obtained regardless of the length of time since the last dose. Immunization is preferred during the 27th-36th week of gestation. An adult who has not previously received Tdap or who does not know her vaccine status should receive 1 dose of Tdap. This initial dose should be followed by tetanus and diphtheria toxoids (Td) booster doses every 10 years. Adults with an unknown or incomplete history of completing a 3-dose immunization series with Td-containing vaccines should begin or complete a primary immunization series including a Tdap dose. Adults should receive a Td booster every 10 years.  Varicella vaccine. An adult without evidence of immunity to varicella should receive 2 doses or a second dose if she has previously received 1 dose. Pregnant females who do not have evidence of immunity should receive the first dose after pregnancy. This first dose should be obtained before leaving the health care facility. The second dose should be obtained 4-8 weeks after the first dose.  Human papillomavirus (HPV) vaccine. Females aged 13-26 years who have not received the vaccine previously should obtain the 3-dose series. The vaccine is not recommended for use in pregnant females. However, pregnancy testing is not needed before receiving a dose. If a female is found to be pregnant after receiving a dose, no treatment is needed. In that case, the remaining doses should be delayed until after the  pregnancy. Immunization is recommended for any person with an immunocompromised condition through the age of 76 years if she did not get any or all doses earlier. During the 3-dose series, the second dose should be obtained 4-8 weeks after the first dose. The third dose should be obtained 24 weeks after the first dose and 16 weeks after the  second dose.  Zoster vaccine. One dose is recommended for adults aged 72 years or older unless certain conditions are present.  Measles, mumps, and rubella (MMR) vaccine. Adults born before [birth year] generally are considered immune to measles and mumps. Adults born in [birth year] or later should have 1 or more doses of MMR vaccine unless there is a contraindication to the vaccine or there is laboratory evidence of immunity to each of the three diseases. A routine second dose of MMR vaccine should be obtained at least 28 days after the first dose for students attending postsecondary schools, health care workers, or international travelers. People who received inactivated measles vaccine or an unknown type of measles vaccine during 1963-1967 should receive 2 doses of MMR vaccine. People who received inactivated mumps vaccine or an unknown type of mumps vaccine before 1979 and are at high risk for mumps infection should consider immunization with 2 doses of MMR vaccine. For females of childbearing age, rubella immunity should be determined. If there is no evidence of immunity, females who are not pregnant should be vaccinated. If there is no evidence of immunity, females who are pregnant should delay immunization until after pregnancy. Unvaccinated health care workers born before [birth year] who lack laboratory evidence of measles, mumps, or rubella immunity or laboratory confirmation of disease should consider measles and mumps immunization with 2 doses of MMR vaccine or rubella immunization with 1 dose of MMR vaccine.  Pneumococcal 13-valent conjugate (PCV13) vaccine. When indicated, a person  who is uncertain of his immunization history and has no record of immunization should receive the PCV13 vaccine. All adults 67 years of age and older should receive this vaccine. An adult aged 17 years or older who has certain medical conditions and has not been previously immunized should receive 1 dose of PCV13 vaccine. This PCV13 should be followed with a dose of pneumococcal polysaccharide (PPSV23) vaccine. Adults who are at high risk for pneumococcal disease should obtain the PPSV23 vaccine at least 8 weeks after the dose of PCV13 vaccine. Adults older than 42 years of age who have normal immune system function should obtain the PPSV23 vaccine dose at least 1 year after the dose of PCV13 vaccine.  Pneumococcal polysaccharide (PPSV23) vaccine. When PCV13 is also indicated, PCV13 should be obtained first. All adults aged 93 years and older should be immunized. An adult younger than age 39 years who has certain medical conditions should be immunized. Any person who resides in a nursing home or long-term care facility should be immunized. An adult smoker should be immunized. People with an immunocompromised condition and certain other conditions should receive both PCV13 and PPSV23 vaccines. People with human immunodeficiency virus (HIV) infection should be immunized as soon as possible after diagnosis. Immunization during chemotherapy or radiation therapy should be avoided. Routine use of PPSV23 vaccine is not recommended for American Indians, [place] Natives, or people younger than 65 years unless there are medical conditions that require PPSV23 vaccine. When indicated, people who have unknown immunization and have no record of immunization should receive PPSV23 vaccine. One-time revaccination 5 years after the first dose of PPSV23 is recommended for people aged 19-64 years who have chronic kidney failure, nephrotic syndrome, asplenia, or immunocompromised conditions. People who received 1-2 doses of PPSV23  before age 7 years should receive another dose of PPSV23 vaccine at age 66 years or later if at least 5 years have passed since the previous dose. Doses of PPSV23 are not needed for people immunized with PPSV23 at or  after age 5 years.  Meningococcal vaccine. Adults with asplenia or persistent complement component deficiencies should receive 2 doses of quadrivalent meningococcal conjugate (MenACWY-D) vaccine. The doses should be obtained at least 2 months apart. Microbiologists working with certain meningococcal bacteria, [employer] recruits, people at risk during an outbreak, and people who travel to or live in countries with a high rate of meningitis should be immunized. A first-year college student up through age 43 years who is living in a residence hall should receive a dose if she did not receive a dose on or after her 16th birthday. Adults who have certain high-risk conditions should receive one or more doses of vaccine.  Hepatitis A vaccine. Adults who wish to be protected from this disease, have certain high-risk conditions, work with hepatitis A-infected animals, work in hepatitis A research labs, or travel to or work in countries with a high rate of hepatitis A should be immunized. Adults who were previously unvaccinated and who anticipate close contact with an international adoptee during the first 60 days after arrival in the [place] from a country with a high rate of hepatitis A should be immunized.  Hepatitis B vaccine. Adults who wish to be protected from this disease, have certain high-risk conditions, may be exposed to blood or other infectious body fluids, are household contacts or sex partners of hepatitis B positive people, are clients or workers in certain care facilities, or travel to or work in countries with a high rate of hepatitis B should be immunized.  Haemophilus influenzae type b (Hib) vaccine. A previously unvaccinated person with asplenia or sickle cell disease or  having a scheduled splenectomy should receive 1 dose of Hib vaccine. Regardless of previous immunization, a recipient of a hematopoietic stem cell transplant should receive a 3-dose series 6-12 months after her successful transplant. Hib vaccine is not recommended for adults with HIV infection. Preventive Services / Frequency Ages 3 to 36 years  Blood pressure check.** / Every 3-5 years.  Lipid and cholesterol check.** / Every 5 years beginning at age 4.  Clinical breast exam.** / Every 3 years for women in their 39s and 70s.  BRCA-related cancer risk assessment.** / For women who have family members with a BRCA-related cancer (breast, ovarian, tubal, or peritoneal cancers).  Pap test.** / Every 2 years from ages 59 through 92. Every 3 years starting at age 5 through age 22 or 10 with a history of 3 consecutive normal Pap tests.  HPV screening.** / Every 3 years from ages 37 through ages 81 to 83 with a history of 3 consecutive normal Pap tests.  Hepatitis C blood test.** / For any individual with known risks for hepatitis C.  Skin self-exam. / Monthly.  Influenza vaccine. / Every year.  Tetanus, diphtheria, and acellular pertussis (Tdap, Td) vaccine.** / Consult your health care provider. Pregnant women should receive 1 dose of Tdap vaccine during each pregnancy. 1 dose of Td every 10 years.  Varicella vaccine.** / Consult your health care provider. Pregnant females who do not have evidence of immunity should receive the first dose after pregnancy.  HPV vaccine. / 3 doses over 6 months, if 76 and younger. The vaccine is not recommended for use in pregnant females. However, pregnancy testing is not needed before receiving a dose.  Measles, mumps, rubella (MMR) vaccine.** / You need at least 1 dose of MMR if you were born in 1957 or later. You may also need a 2nd dose. For females of childbearing age,  rubella immunity should be determined. If there is no evidence of immunity, females  who are not pregnant should be vaccinated. If there is no evidence of immunity, females who are pregnant should delay immunization until after pregnancy.  Pneumococcal 13-valent conjugate (PCV13) vaccine.** / Consult your health care provider.  Pneumococcal polysaccharide (PPSV23) vaccine.** / 1 to 2 doses if you smoke cigarettes or if you have certain conditions.  Meningococcal vaccine.** / 1 dose if you are age 17 to 23 years and a Market researcher living in a residence hall, or have one of several medical conditions, you need to get vaccinated against meningococcal disease. You may also need additional booster doses.  Hepatitis A vaccine.** / Consult your health care provider.  Hepatitis B vaccine.** / Consult your health care provider.  Haemophilus influenzae type b (Hib) vaccine.** / Consult your health care provider. Ages 46 to 27 years  Blood pressure check.** / Every year.  Lipid and cholesterol check.** / Every 5 years beginning at age 57 years.  Lung cancer screening. / Every year if you are aged 65-80 years and have a 30-pack-year history of smoking and currently smoke or have quit within the past 15 years. Yearly screening is stopped once you have quit smoking for at least 15 years or develop a health problem that would prevent you from having lung cancer treatment.  Clinical breast exam.** / Every year after age 58 years.  BRCA-related cancer risk assessment.** / For women who have family members with a BRCA-related cancer (breast, ovarian, tubal, or peritoneal cancers).  Mammogram.** / Every year beginning at age 50 years and continuing for as long as you are in good health. Consult with your health care provider.  Pap test.** / Every 3 years starting at age 43 years through age 28 or 80 years with a history of 3 consecutive normal Pap tests.  HPV screening.** / Every 3 years from ages 42 years through ages 28 to 70 years with a history of 3 consecutive normal  Pap tests.  Fecal occult blood test (FOBT) of stool. / Every year beginning at age 33 years and continuing until age 29 years. You may not need to do this test if you get a colonoscopy every 10 years.  Flexible sigmoidoscopy or colonoscopy.** / Every 5 years for a flexible sigmoidoscopy or every 10 years for a colonoscopy beginning at age 82 years and continuing until age 73 years.  Hepatitis C blood test.** / For all people born from [birth year] through 1967 and any individual with known risks for hepatitis C.  Skin self-exam. / Monthly.  Influenza vaccine. / Every year.  Tetanus, diphtheria, and acellular pertussis (Tdap/Td) vaccine.** / Consult your health care provider. Pregnant women should receive 1 dose of Tdap vaccine during each pregnancy. 1 dose of Td every 10 years.  Varicella vaccine.** / Consult your health care provider. Pregnant females who do not have evidence of immunity should receive the first dose after pregnancy.  Zoster vaccine.** / 1 dose for adults aged 58 years or older.  Measles, mumps, rubella (MMR) vaccine.** / You need at least 1 dose of MMR if you were born in 1957 or later. You may also need a second dose. For females of childbearing age, rubella immunity should be determined. If there is no evidence of immunity, females who are not pregnant should be vaccinated. If there is no evidence of immunity, females who are pregnant should delay immunization until after pregnancy.  Pneumococcal 13-valent conjugate (PCV13) vaccine.** /  Consult your health care provider.  Pneumococcal polysaccharide (PPSV23) vaccine.** / 1 to 2 doses if you smoke cigarettes or if you have certain conditions.  Meningococcal vaccine.** / Consult your health care provider.  Hepatitis A vaccine.** / Consult your health care provider.  Hepatitis B vaccine.** / Consult your health care provider.  Haemophilus influenzae type b (Hib) vaccine.** / Consult your health care provider. Ages 75 years  and over  Blood pressure check.** / Every year.  Lipid and cholesterol check.** / Every 5 years beginning at age 54 years.  Lung cancer screening. / Every year if you are aged 28-80 years and have a 30-pack-year history of smoking and currently smoke or have quit within the past 15 years. Yearly screening is stopped once you have quit smoking for at least 15 years or develop a health problem that would prevent you from having lung cancer treatment.  Clinical breast exam.** / Every year after age 35 years.  BRCA-related cancer risk assessment.** / For women who have family members with a BRCA-related cancer (breast, ovarian, tubal, or peritoneal cancers).  Mammogram.** / Every year beginning at age 35 years and continuing for as long as you are in good health. Consult with your health care provider.  Pap test.** / Every 3 years starting at age 99 years through age 94 or 24 years with 3 consecutive normal Pap tests. Testing can be stopped between 65 and 70 years with 3 consecutive normal Pap tests and no abnormal Pap or HPV tests in the past 10 years.  HPV screening.** / Every 3 years from ages 59 years through ages 52 or 24 years with a history of 3 consecutive normal Pap tests. Testing can be stopped between 65 and 70 years with 3 consecutive normal Pap tests and no abnormal Pap or HPV tests in the past 10 years.  Fecal occult blood test (FOBT) of stool. / Every year beginning at age 32 years and continuing until age 84 years. You may not need to do this test if you get a colonoscopy every 10 years.  Flexible sigmoidoscopy or colonoscopy.** / Every 5 years for a flexible sigmoidoscopy or every 10 years for a colonoscopy beginning at age 35 years and continuing until age 39 years.  Hepatitis C blood test.** / For all people born from [birth year] through 1967 and any individual with known risks for hepatitis C.  Osteoporosis screening.** / A one-time screening for women ages 7 years and over and  women at risk for fractures or osteoporosis.  Skin self-exam. / Monthly.  Influenza vaccine. / Every year.  Tetanus, diphtheria, and acellular pertussis (Tdap/Td) vaccine.** / 1 dose of Td every 10 years.  Varicella vaccine.** / Consult your health care provider.  Zoster vaccine.** / 1 dose for adults aged 24 years or older.  Pneumococcal 13-valent conjugate (PCV13) vaccine.** / Consult your health care provider.  Pneumococcal polysaccharide (PPSV23) vaccine.** / 1 dose for all adults aged 89 years and older.  Meningococcal vaccine.** / Consult your health care provider.  Hepatitis A vaccine.** / Consult your health care provider.  Hepatitis B vaccine.** / Consult your health care provider.  Haemophilus influenzae type b (Hib) vaccine.** / Consult your health care provider. ** Family history and personal history of risk and conditions may change your health care provider's recommendations.   This information is not intended to replace advice given to you by your health care provider. Make sure you discuss any questions you have with your health care provider.  Document Released: 02/22/2001 Document Revised: 01/17/2014 Document Reviewed: 05/24/2010 Elsevier Interactive Patient Education 2016 [place] examination Place order for mammogram, papsmear, lipid panel and bp will get tdap today.

## 2015-02-02 NOTE — Progress Notes (Signed)
Pre visit review using our clinic review tool, if applicable. No additional management support is needed unless otherwise documented below in the visit note. 

## 2015-02-02 NOTE — Progress Notes (Signed)
Subjective:    Patient ID: Terri Morris, female    DOB: 05-22-1975, 40 y.o.   MRN: [id number]  HPI  I have reviewed pt PMH, PSH, FH, Social History and Surgical History  Pt works at [employer] department), no exercise, Pt smokes pack a day, 3 glasses of wine on weekends, 1 cup coffee a day, married.  Pt needs a mammogram and papsmear  Pt needs tdap.  Pt declined flu vaccine this year.   No acute complaints today.       Review of Systems  Constitutional: Negative for fever, chills, diaphoresis, activity change and fatigue.  Respiratory: Negative for cough, chest tightness and shortness of breath.   Cardiovascular: Negative for chest pain, palpitations and leg swelling.  Gastrointestinal: Negative for nausea, vomiting and abdominal pain.  Musculoskeletal: Negative for neck pain and neck stiffness.  Neurological: Negative for dizziness, speech difficulty, weakness, numbness and headaches.  Psychiatric/Behavioral: Negative for behavioral problems, confusion and agitation. The patient is not nervous/anxious.    Past Medical History  Diagnosis Date  . Anxiety   . Hypertension   . Thyroid disease     Social History   Social History  . Marital Status: Married    Spouse Name: N/A  . Number of Children: N/A  . Years of Education: N/A   Occupational History  . Not on file.   Social History Main Topics  . Smoking status: Heavy Tobacco Smoker -- 1.00 packs/day    Types: Cigarettes  . Smokeless tobacco: Not on file  . Alcohol Use: Yes     Comment: 3 glasses of wine on weekends  . Drug Use: No  . Sexual Activity: Yes   Other Topics Concern  . Not on file   Social History Narrative    Past Surgical History  Procedure Laterality Date  . Ectopic pregnancy surgery      Family History  Problem Relation Age of Onset  . Hypertension Mother   . Stroke Mother   . Arthritis Mother   . Alcohol abuse Mother   . Diabetes Mother   . Hypertension  Father   . Alcohol abuse Father   . Diabetes Father     No Known Allergies  Current Outpatient Prescriptions on File Prior to Visit  Medication Sig Dispense Refill  . lisinopril (PRINIVIL,ZESTRIL) 10 MG tablet Take 1 tablet (10 mg total) by mouth daily. 30 tablet 0  . potassium chloride (K-DUR,KLOR-CON) 10 MEQ tablet 1 tab po every other day for 2 wks. 7 tablet 0   No current facility-administered medications on file prior to visit.    BP 122/86 mmHg  Pulse 80  Temp(Src) 98.3 F (36.8 C) (Oral)  Ht 5\' 11"  (1.803 m)  Wt 194 lb 9.6 oz (88.27 kg)  BMI 27.15 kg/m2  SpO2 98%  LMP 12/28/2014       Objective:   Physical Exam   General   Mental Status- Alert.  Orientation-Oriented x3. Build and Nutrition Well Nourished and Well Developed.  Skin General: Normal.  Color- Normal color. Moisture- Dry.Temperature warm. Lesions: No suspicious lesions  Head, Eyes, Ears, Nose, Thoat Ears-Normal. Auditory Canal-Bilateral-Normal. Tympanic Membrane- Bilateral-Normal. Eyes Fundi- Bilateral-Normal. Pupil- Bilateral- Direct reaction to light normal. Nose & Sinuses- Normal. Nostril- Bilateral-Normal.  Neck Neck- No Bruits or Masses. Thyroid- Normal. No thyromegaly or nodules.  Breast Breast Lump: No palpable masses, symmetric, no axillary lymphadenopathy palpated.(only small dimpled area rt breast). She states as child had small surgery to area. But  does not remember details.  Chest and Lung Exam  Percussion: Quality and Intensity:-Percussion normal. Percussion of chest reveals- No Dullness. Palpation of the chest reveals- Non-tender. Auscultation: Breath sounds-Normal. Adventitious  Sounds:No adventitious   Vaginal External: Labia majora and minora normal/no lesions. Pelvic/Bimanual exam: Cervical OS not red or friable. No discharge. No cervical motion tenderness. No masses felt on palpation of adnexal regions. Cardiovascular Inspection: No Heaves. Auscultation: Heart  Sounds- Normal sinus rhythm without murmur or gallop, S1 WNL and S2 WNL.  Abdomen Inspection:- Inspection Normal. Inspection of abdomen reveals- No Hernias. Palpation/Percussion: Palpation and Percussion of the abdomen reveal- Non Tender and No Palpable masses. Liver: Other Characteristics- No Hepatmegaly Spleen:Other Characteristics- No Splenomegaly. Auscultation: Auscultation of the abdomen reveals-Bowel sounds normal and No Abdominal bruits.    Neurologic Mental Status- Normal Cranial Nerves- Normal Bilaterally, Motor- Normal. Strength:5/5 normal muscle strength- All Muscles.  Meningeal Signs- None.  Musculoskeletal Global Assessment General- Joints show full range of motion without obvious deformity and Normal muscle mass. Strength 5/5 in upper and lower extremities.  Lymphatic General lymphatics Description-No Generalized lymphadenopathy.        Assessment & Plan:

## 2015-02-02 NOTE — Assessment & Plan Note (Signed)
Place order for mammogram, papsmear, lipid panel and bp will get tdap today.

## 2015-02-03 LAB — CYTOLOGY - PAP

## 2015-02-04 DIAGNOSIS — R87613 High grade squamous intraepithelial lesion on cytologic smear of cervix (HGSIL): Secondary | ICD-10-CM

## 2015-02-04 MED ORDER — CHOLINE FENOFIBRATE 45 MG PO CPDR: 45.0000 mg | DELAYED_RELEASE_CAPSULE | Freq: Every day | ORAL | Status: DC

## 2015-02-04 MED ORDER — METRONIDAZOLE 500 MG PO TABS: 500.0000 mg | ORAL_TABLET | Freq: Three times a day (TID) | ORAL | Status: DC

## 2015-02-04 NOTE — Telephone Encounter (Signed)
Pt notified on trich and abnormal papsmear.

## 2015-02-04 NOTE — Telephone Encounter (Signed)
Please see referral to gyn. Can she be seen asap.

## 2015-02-04 NOTE — Progress Notes (Signed)
Quick Note:  Pt has seen results on MyChart and message also sent for patient to call back if any questions. ______ 

## 2015-02-04 NOTE — Telephone Encounter (Signed)
rx fenofibrate sent in.

## 2015-02-12 ENCOUNTER — Ambulatory Visit (HOSPITAL_BASED_OUTPATIENT_CLINIC_OR_DEPARTMENT_OTHER): Payer: PRIVATE HEALTH INSURANCE

## 2015-03-04 ENCOUNTER — Telehealth: Payer: Self-pay | Admitting: Medical

## 2015-03-04 NOTE — Telephone Encounter (Signed)
Husband here. Did not go in to detail. But asked him to pass message with wife to call our office to talk with marjorie.(regarding mammogram which he mentioned and referral to gyn(I did not explain that to him).   Also try to get marjorie to call pt since she has ascus.

## 2015-03-04 NOTE — Telephone Encounter (Signed)
Looks like extreme effort has been made to call and documented. Thanks for trying. Also asked husband today who was in office to call you.

## 2015-03-04 NOTE — Telephone Encounter (Signed)
[facility] messages for the patient with no response.  Imaging has left several messages for her to schedule her mammogram with no response.  I left messages on her phone for her to call me and I would schedule these for her and call her back with the dates and times with no response.  Called again today and left message for patient

## 2015-03-05 ENCOUNTER — Ambulatory Visit (HOSPITAL_BASED_OUTPATIENT_CLINIC_OR_DEPARTMENT_OTHER)
Admission: RE | Admit: 2015-03-05 | Discharge: 2015-03-05 | Disposition: A | Discharge status: Home / Self Care | Payer: PRIVATE HEALTH INSURANCE | Source: Ambulatory Visit | Attending: Medical | Admitting: Medical

## 2015-03-05 DIAGNOSIS — Z1231 Encounter for screening mammogram for malignant neoplasm of breast: Secondary | ICD-10-CM | POA: Insufficient documentation

## 2015-03-05 NOTE — Telephone Encounter (Signed)
Imaging finally got hold of the patient yesterday and her mammogram is scheduled

## 2015-05-29 ENCOUNTER — Other Ambulatory Visit: Payer: Self-pay | Admitting: Medical

## 2015-05-29 ENCOUNTER — Encounter: Payer: Self-pay | Admitting: Medical

## 2015-05-29 ENCOUNTER — Ambulatory Visit (INDEPENDENT_AMBULATORY_CARE_PROVIDER_SITE_OTHER): Payer: PRIVATE HEALTH INSURANCE | Admitting: Medical

## 2015-05-29 ENCOUNTER — Ambulatory Visit (HOSPITAL_BASED_OUTPATIENT_CLINIC_OR_DEPARTMENT_OTHER)
Admission: RE | Admit: 2015-05-29 | Discharge: 2015-05-29 | Disposition: A | Discharge status: Home / Self Care | Payer: PRIVATE HEALTH INSURANCE | Source: Ambulatory Visit | Attending: Medical | Admitting: Medical

## 2015-05-29 ENCOUNTER — Telehealth: Payer: Self-pay | Admitting: Medical

## 2015-05-29 VITALS — BP 138/90 | HR 81 | Temp 98.1°F | Ht 71.0 in | Wt 196.0 lb

## 2015-05-29 DIAGNOSIS — R600 Localized edema: Secondary | ICD-10-CM

## 2015-05-29 DIAGNOSIS — I82622 Acute embolism and thrombosis of deep veins of left upper extremity: Secondary | ICD-10-CM | POA: Insufficient documentation

## 2015-05-29 DIAGNOSIS — Z720 Tobacco use: Secondary | ICD-10-CM | POA: Diagnosis present

## 2015-05-29 DIAGNOSIS — R34 Anuria and oliguria: Secondary | ICD-10-CM

## 2015-05-29 DIAGNOSIS — R609 Edema, unspecified: Secondary | ICD-10-CM | POA: Diagnosis not present

## 2015-05-29 DIAGNOSIS — Z87891 Personal history of nicotine dependence: Secondary | ICD-10-CM

## 2015-05-29 LAB — CBC WITH DIFFERENTIAL/PLATELET
BASOS PCT: 2.1 % (ref 0.0–3.0)
Basophils Absolute: 0.1 10*3/uL (ref 0.0–0.1)
EOS PCT: 1.3 % (ref 0.0–5.0)
Eosinophils Absolute: 0.1 10*3/uL (ref 0.0–0.7)
HCT: 37.8 % (ref 36.0–46.0)
HEMOGLOBIN: 12.8 g/dL (ref 12.0–15.0)
Lymphocytes Relative: 45 % (ref 12.0–46.0)
Lymphs Abs: 2.2 10*3/uL (ref 0.7–4.0)
MCHC: 33.7 g/dL (ref 30.0–36.0)
MCV: 102.2 fl — AB (ref 78.0–100.0)
MONO ABS: 0.4 10*3/uL (ref 0.1–1.0)
MONOS PCT: 9 % (ref 3.0–12.0)
Neutro Abs: 2.1 10*3/uL (ref 1.4–7.7)
Neutrophils Relative %: 42.6 % — ABNORMAL LOW (ref 43.0–77.0)
Platelets: 385 10*3/uL (ref 150.0–400.0)
RBC: 3.7 Mil/uL — ABNORMAL LOW (ref 3.87–5.11)
RDW: 16.3 % — AB (ref 11.5–15.5)
WBC: 5 10*3/uL (ref 4.0–10.5)

## 2015-05-29 LAB — COMPREHENSIVE METABOLIC PANEL
ALT: 29 U/L (ref 0–35)
AST: 30 U/L (ref 0–37)
Albumin: 4.2 g/dL (ref 3.5–5.2)
Alkaline Phosphatase: 40 U/L (ref 39–117)
BUN: 9 mg/dL (ref 6–23)
CHLORIDE: 103 meq/L (ref 96–112)
CO2: 25 mEq/L (ref 19–32)
Calcium: 8.7 mg/dL (ref 8.4–10.5)
Creatinine, Ser: 0.87 mg/dL (ref 0.40–1.20)
GFR: 91.9 mL/min (ref >60.00)
GLUCOSE: 112 mg/dL — AB (ref 70–99)
POTASSIUM: 3.4 meq/L — AB (ref 3.5–5.1)
SODIUM: 140 meq/L (ref 135–145)
Total Bilirubin: 0.5 mg/dL (ref 0.2–1.2)
Total Protein: 7.1 g/dL (ref 6.0–8.3)

## 2015-05-29 MED ORDER — RIVAROXABAN 20 MG PO TABS: 20.0000 mg | ORAL_TABLET | Freq: Every day | ORAL | Status: DC

## 2015-05-29 NOTE — Telephone Encounter (Signed)
Pt is on starter pack xarelto. Sent in new rx after she finishes starter pack.

## 2015-05-29 NOTE — Progress Notes (Signed)
Pre visit review using our clinic review tool, if applicable. No additional management support is needed unless otherwise documented below in the visit note. 

## 2015-05-29 NOTE — Progress Notes (Addendum)
[facility] Venous Img Upper Bilat--IMPRESSION: 1. Positive for nonocclusive DVT in the left basilic vein in the mid arm. Given the sonographic appearance, this may represent a subacute thrombus with early recanalization. 2. No evidence of DVT in the right upper extremity.  Verbal order given by provider for patient to take Xarelto 15 mg by mouth twice a day and to follow up in 1 week in the morning.  Xarelto starter kit given to patient.  Instructions given to take Xarelto by mouth twice a day for 21 days and to follow up with Percell Miller next Friday, May 26th in the morning.  Pt stated understanding and agreed with plan.  F/u appt with Percell Miller scheduled for 06/05/15 @ 9 am.  Pt was encouraged to call with questions or concerns.       I did review Lisabeth Mian RN note and this was the plan.  Saguier, Percell Miller, PA-C

## 2015-05-29 NOTE — Patient Instructions (Addendum)
For your upper extremity edema we will get bilateral upper ext US.(today)  For history of smoking will get cxr.  For decreased urination will get cmp and cmp.  You have dense breast on review and if no cause found on work up. May do repeat imaging of breasts.  Follow up 7 days or as needed

## 2015-05-29 NOTE — Progress Notes (Signed)
Subjective:    Patient ID: Terri Morris, female    DOB: 05-23-1975, 40 y.o.   MRN: [id number]  HPI   Pt in for evaluation for some swelling that she thinks occuring in her upper chest and arms. She feels like edema present. Some days worse than others. She fells like going on for one week. No itching to skin.  Pt also notes decreased urination 3 times. She typically urinates a lot more.  She has been drinking normal amount.  Pt last mammogram a couple of month ago and normal. No reported lymphadenopathy in axillary areas. But dense tissue.  Pt has history of smoking. Pt smokes 7-8 cigaretes a day. At least 20 year history of smoking.  LMP- last week and normal.    Review of Systems  Constitutional: Negative for fever, chills and fatigue.  Respiratory: Negative for cough, chest tightness, shortness of breath and wheezing.   Cardiovascular: Negative for chest pain and palpitations.  Gastrointestinal: Negative for nausea, vomiting, abdominal pain, diarrhea and constipation.  Musculoskeletal: Negative for back pain.       See hpi  Upper pectoralis/chest edema.  Neurological: Negative for dizziness and facial asymmetry.  Hematological: Negative for adenopathy. Does not bruise/bleed easily.  Psychiatric/Behavioral: Negative for confusion and agitation.    Past Medical History  Diagnosis Date  . Anxiety   . Hypertension   . Thyroid disease      Social History   Social History  . Marital Status: Married    Spouse Name: N/A  . Number of Children: N/A  . Years of Education: N/A   Occupational History  . Not on file.   Social History Main Topics  . Smoking status: Heavy Tobacco Smoker -- 1.00 packs/day    Types: Cigarettes  . Smokeless tobacco: Not on file  . Alcohol Use: Yes     Comment: 3 glasses of wine on weekends  . Drug Use: No  . Sexual Activity: Yes   Other Topics Concern  . Not on file   Social History Narrative    Past Surgical History    Procedure Laterality Date  . Ectopic pregnancy surgery      Family History  Problem Relation Age of Onset  . Hypertension Mother   . Stroke Mother   . Arthritis Mother   . Alcohol abuse Mother   . Diabetes Mother   . Hypertension Father   . Alcohol abuse Father   . Diabetes Father     No Known Allergies  Current Outpatient Prescriptions on File Prior to Visit  Medication Sig Dispense Refill  . Choline Fenofibrate 45 MG capsule Take 1 capsule (45 mg total) by mouth daily. 30 capsule 3  . lisinopril (PRINIVIL,ZESTRIL) 10 MG tablet Take 1 tablet (10 mg total) by mouth daily. 30 tablet 3  . metroNIDAZOLE (FLAGYL) 500 MG tablet Take 1 tablet (500 mg total) by mouth 3 (three) times daily. 30 tablet 0  . potassium chloride (K-DUR,KLOR-CON) 10 MEQ tablet 1 tab po every other day for 2 wks. 7 tablet 0   No current facility-administered medications on file prior to visit.    BP 138/90 mmHg  Pulse 81  Temp(Src) 98.1 F (36.7 C) (Oral)  Ht 5\' 11"  (1.803 m)  Wt 196 lb (88.905 kg)  BMI 27.35 kg/m2  SpO2 98%  LMP 05/22/2015       Objective:   Physical Exam  General Mental Status- Alert. General Appearance- Not in acute distress.   Skin General: Color-  Normal Color. Moisture- Normal Moisture.  Neck Carotid Arteries- Normal color. Moisture- Normal Moisture. No carotid bruits. No JVD.  Chest and Lung Exam Auscultation: Breath Sounds:-Normal.  Cardiovascular Auscultation:Rythm- Regular. Murmurs & Other Heart Sounds:Auscultation of the heart reveals- No Murmurs.  Abdomen Inspection:-Inspeection Normal. Palpation/Percussion:Note:No mass. Palpation and Percussion of the abdomen reveal- Non Tender, Non Distended + BS, no rebound or guarding.    Neurologic Cranial Nerve exam:- CN III-XII intact(No nystagmus), symmetric smile. Strength:- 5/5 equal and symmetric strength both upper and lower extremities.  Upper ext- no obvious edema. Good pulses. No bruits brachial  areas both sides. No lymphadenopathy. No axillary nodes felt either side.  Upper chest- no pain on palpation. No appreciable edema.      Assessment & Plan:  For your upper extremity edema we will get bilateral upper ext US.(today)  For history of smoking will get cxr.  For decreased urination will get cmp and cmp.  You have dense breast on review and if no cause found on work up. May do repeat imaging of breasts.  Follow up 7 days or as needed

## 2015-05-31 ENCOUNTER — Emergency Department (HOSPITAL_BASED_OUTPATIENT_CLINIC_OR_DEPARTMENT_OTHER): Payer: PRIVATE HEALTH INSURANCE

## 2015-05-31 ENCOUNTER — Emergency Department (HOSPITAL_BASED_OUTPATIENT_CLINIC_OR_DEPARTMENT_OTHER)
Admission: EM | Admit: 2015-05-31 | Discharge: 2015-05-31 | Disposition: A | Discharge status: Home / Self Care | Payer: PRIVATE HEALTH INSURANCE | Attending: Emergency Medicine | Admitting: Emergency Medicine

## 2015-05-31 ENCOUNTER — Encounter (HOSPITAL_BASED_OUTPATIENT_CLINIC_OR_DEPARTMENT_OTHER): Payer: Self-pay | Admitting: Emergency Medicine

## 2015-05-31 DIAGNOSIS — R0789 Other chest pain: Secondary | ICD-10-CM | POA: Diagnosis not present

## 2015-05-31 DIAGNOSIS — I10 Essential (primary) hypertension: Secondary | ICD-10-CM | POA: Insufficient documentation

## 2015-05-31 DIAGNOSIS — F17210 Nicotine dependence, cigarettes, uncomplicated: Secondary | ICD-10-CM | POA: Diagnosis not present

## 2015-05-31 LAB — CBC WITH DIFFERENTIAL/PLATELET
BASOS ABS: 0 10*3/uL (ref 0.0–0.1)
BASOS PCT: 0 %
Eosinophils Absolute: 0.2 10*3/uL (ref 0.0–0.7)
Eosinophils Relative: 3 %
HEMATOCRIT: 35.3 % — AB (ref 36.0–46.0)
HEMOGLOBIN: 12.4 g/dL (ref 12.0–15.0)
LYMPHS PCT: 37 %
Lymphs Abs: 2.1 10*3/uL (ref 0.7–4.0)
MCH: 35.4 pg — ABNORMAL HIGH (ref 26.0–34.0)
MCHC: 35.1 g/dL (ref 30.0–36.0)
MCV: 100.9 fL — AB (ref 78.0–100.0)
MONOS PCT: 10 %
Monocytes Absolute: 0.5 10*3/uL (ref 0.1–1.0)
NEUTROS ABS: 2.8 10*3/uL (ref 1.7–7.7)
NEUTROS PCT: 50 %
Platelets: 317 10*3/uL (ref 150–400)
RBC: 3.5 MIL/uL — ABNORMAL LOW (ref 3.87–5.11)
RDW: 14.5 % (ref 11.5–15.5)
WBC: 5.6 10*3/uL (ref 4.0–10.5)

## 2015-05-31 LAB — BASIC METABOLIC PANEL
ANION GAP: 11 (ref 5–15)
BUN: 7 mg/dL (ref 6–20)
CHLORIDE: 102 mmol/L (ref 101–111)
CO2: 24 mmol/L (ref 22–32)
Calcium: 8.4 mg/dL — ABNORMAL LOW (ref 8.9–10.3)
Creatinine, Ser: 0.68 mg/dL (ref 0.44–1.00)
GFR calc non Af Amer: >60 mL/min (ref >60)
GLUCOSE: 121 mg/dL — AB (ref 65–99)
Potassium: 2.8 mmol/L — ABNORMAL LOW (ref 3.5–5.1)
Sodium: 137 mmol/L (ref 135–145)

## 2015-05-31 LAB — TROPONIN I: Troponin I: <0.03 ng/mL (ref <0.031)

## 2015-05-31 MED ORDER — IOPAMIDOL (ISOVUE-370) INJECTION 76%
100.0000 mL | Freq: Once | INTRAVENOUS | Status: AC | PRN
  Administered 2015-05-31: 100 mL via INTRAVENOUS

## 2015-05-31 NOTE — ED Notes (Signed)
Patient transported to CT 

## 2015-05-31 NOTE — ED Notes (Signed)
1st IV infiltrated. 2nd IV started while pt in CT.

## 2015-05-31 NOTE — Discharge Instructions (Signed)

## 2015-05-31 NOTE — ED Notes (Signed)
Patient states that she was seen and treated for a Left arm DVT on Friday by her PMD  - the patient states that about 2 hours ago she started to have pain to her left chest and breast area with SOB and some nausea

## 2015-05-31 NOTE — ED Provider Notes (Addendum)
CSN: [id number]     Arrival date & time 05/31/15  0151 History   First MD Initiated Contact with Patient 05/31/15 0320     Chief Complaint  Patient presents with  . Chest Pain     (Consider location/radiation/quality/duration/timing/severity/associated sxs/prior Treatment) HPI  This is a 40 year old female who has been having left upper extremity and chest tightness for the past week. She was diagnosed with a left upper extremity DVT 2 days ago. She was started on Xarelto and has had 3 doses. She is here with pain beneath her left breast that began about midnight. The pain is not severe. It is described as a tightness. It is worse with movement and certain positions, especially lying supine. She has had some associated shortness of breath and nausea.  Past Medical History  Diagnosis Date  . Anxiety   . Hypertension   . Thyroid disease    Past Surgical History  Procedure Laterality Date  . Ectopic pregnancy surgery     Family History  Problem Relation Age of Onset  . Hypertension Mother   . Stroke Mother   . Arthritis Mother   . Alcohol abuse Mother   . Diabetes Mother   . Hypertension Father   . Alcohol abuse Father   . Diabetes Father    Social History  Substance Use Topics  . Smoking status: Heavy Tobacco Smoker -- 1.00 packs/day    Types: Cigarettes  . Smokeless tobacco: None  . Alcohol Use: Yes     Comment: 3 glasses of wine on weekends   OB History    No data available     Review of Systems  All other systems reviewed and are negative.   Allergies  Review of patient's allergies indicates no known allergies.  Home Medications   Prior to Admission medications   Medication Sig Start Date End Date Taking? Authorizing Provider  Choline Fenofibrate 45 MG capsule Take 1 capsule (45 mg total) by mouth daily. 02/04/15   Percell Miller Saguier, PA-C  lisinopril (PRINIVIL,ZESTRIL) 10 MG tablet Take 1 tablet (10 mg total) by mouth daily. 02/02/15   Percell Miller Saguier, PA-C   metroNIDAZOLE (FLAGYL) 500 MG tablet Take 1 tablet (500 mg total) by mouth 3 (three) times daily. 02/04/15   Percell Miller Saguier, PA-C  potassium chloride (K-DUR,KLOR-CON) 10 MEQ tablet 1 tab po every other day for 2 wks. 01/23/15   Percell Miller Saguier, PA-C  rivaroxaban (XARELTO) 20 MG TABS tablet Take 1 tablet (20 mg total) by mouth daily with supper. 05/29/15   Edward Saguier, PA-C   BP 166/109 mmHg  Pulse 85  Temp(Src) 99 F (37.2 C) (Oral)  Resp 18  Ht 5\' 11"  (1.803 m)  Wt 196 lb (88.905 kg)  BMI 27.35 kg/m2  SpO2 99%  LMP 05/22/2015   Physical Exam  General: Well-developed, well-nourished female in no acute distress; appearance consistent with age of record HENT: normocephalic; atraumatic Eyes: pupils equal, round and reactive to light; extraocular muscles intact Neck: supple Heart: regular rate and rhythm Lungs: clear to auscultation bilaterally Chest: Left inframammary chest tenderness that reproduces the pain of chief complaint Abdomen: soft; nondistended; nontender; no masses or hepatosplenomegaly; bowel sounds present Extremities: No deformity; full range of motion; pulses normal Neurologic: Awake, alert and oriented; motor function intact in all extremities and symmetric; no facial droop Skin: Warm and dry Psychiatric: Normal mood and affect    ED Course  Procedures (including critical care time)   MDM  Nursing notes and vitals signs, including pulse  oximetry, reviewed.  Summary of this visit's results, reviewed by myself:  Labs:  Results for orders placed or performed during the hospital encounter of 05/31/15 (from the past 24 hour(s))  CBC with Differential/Platelet     Status: Abnormal   Collection Time: 05/31/15  3:45 AM  Result Value Ref Range   WBC 5.6 4.0 - 10.5 K/uL   RBC 3.50 (L) 3.87 - 5.11 MIL/uL   Hemoglobin 12.4 12.0 - 15.0 g/dL   HCT 35.3 (L) 36.0 - 46.0 %   MCV 100.9 (H) 78.0 - 100.0 fL   MCH 35.4 (H) 26.0 - 34.0 pg   MCHC 35.1 30.0 - 36.0 g/dL    RDW 14.5 11.5 - 15.5 %   Platelets 317 150 - 400 K/uL   Neutrophils Relative % 50 %   Neutro Abs 2.8 1.7 - 7.7 K/uL   Lymphocytes Relative 37 %   Lymphs Abs 2.1 0.7 - 4.0 K/uL   Monocytes Relative 10 %   Monocytes Absolute 0.5 0.1 - 1.0 K/uL   Eosinophils Relative 3 %   Eosinophils Absolute 0.2 0.0 - 0.7 K/uL   Basophils Relative 0 %   Basophils Absolute 0.0 0.0 - 0.1 K/uL  Basic metabolic panel     Status: Abnormal   Collection Time: 05/31/15  3:45 AM  Result Value Ref Range   Sodium 137 135 - 145 mmol/L   Potassium 2.8 (L) 3.5 - 5.1 mmol/L   Chloride 102 101 - 111 mmol/L   CO2 24 22 - 32 mmol/L   Glucose, Bld 121 (H) 65 - 99 mg/dL   BUN 7 6 - 20 mg/dL   Creatinine, Ser 0.68 0.44 - 1.00 mg/dL   Calcium 8.4 (L) 8.9 - 10.3 mg/dL   GFR calc non Af Amer >60 >60 mL/min   GFR calc Af Amer >60 >60 mL/min   Anion gap 11 5 - 15  Troponin I     Status: None   Collection Time: 05/31/15  3:45 AM  Result Value Ref Range   Troponin I <0.03 <0.031 ng/mL    Imaging Studies: Dg Chest 2 View  05/29/2015  CLINICAL DATA:  Chest tightness and arm swelling since last week ; history of hypertension, thyroid disease, current smoker. EXAM: CHEST  2 VIEW COMPARISON:  Chest x-ray of January 13, 2015. FINDINGS: The lungs are adequately inflated and clear. The heart and pulmonary vascularity are normal. The mediastinum is normal in width. The trachea is midline. There is no pleural effusion. The bony thorax is unremarkable. IMPRESSION: There is no active cardiopulmonary disease. Electronically Signed   By: David  Martinique M.D.   On: 05/29/2015 15:03   Ct Angio Chest Pe W/cm &/or Wo Cm  05/31/2015  CLINICAL DATA:  Left basilic vein DVT found 2 days ago. Now with left breast and chest pain for 5 hours. Shortness of breath. Smoker. EXAM: CT ANGIOGRAPHY CHEST WITH CONTRAST TECHNIQUE: Multidetector CT imaging of the chest was performed using the standard protocol during bolus administration of intravenous contrast.  Multiplanar CT image reconstructions and MIPs were obtained to evaluate the vascular anatomy. CONTRAST:  100 mL Isovue 370 COMPARISON:  None. FINDINGS: Technically adequate study with moderately good opacification of the central and segmental pulmonary arteries. No focal filling defects demonstrated. No evidence of significant pulmonary embolus. Normal heart size. Normal caliber thoracic aorta. No aortic dissection. Great vessel origins are patent. Esophagus is decompressed. No significant lymphadenopathy in the chest. Dependent atelectasis in the lung bases. No focal airspace  disease or consolidation. Airways are patent. No pneumothorax. No pleural effusions. Included portions of the upper abdominal organs demonstrate diffuse fatty infiltration of the liver. No destructive bone lesions. Review of the MIP images confirms the above findings. IMPRESSION: No evidence of significant pulmonary embolus. No evidence of active pulmonary disease. Electronically Signed   By: Lucienne Capers M.D.   On: 05/31/2015 05:31   US Venous Img Upper Bilat  05/29/2015  CLINICAL DATA:  40 year old female with swelling and tightness in the arms chest for 1 week EXAM: BILATERAL UPPER EXTREMITY VENOUS DOPPLER ULTRASOUND TECHNIQUE: Gray-scale sonography with graded compression, as well as color Doppler and duplex ultrasound were performed to evaluate the bilateral upper extremity deep venous systems from the level of the subclavian vein and including the jugular, axillary, basilic, radial, ulnar and upper cephalic vein. Spectral Doppler was utilized to evaluate flow at rest and with distal augmentation maneuvers. COMPARISON:  None. FINDINGS: RIGHT UPPER EXTREMITY Internal Jugular Vein: No evidence of thrombus. Normal compressibility, respiratory phasicity and response to augmentation. Subclavian Vein: No evidence of thrombus. Normal compressibility, respiratory phasicity and response to augmentation. Axillary Vein: No evidence of  thrombus. Normal compressibility, respiratory phasicity and response to augmentation. Cephalic Vein: No evidence of thrombus. Normal compressibility, respiratory phasicity and response to augmentation. Basilic Vein: No evidence of thrombus. Normal compressibility, respiratory phasicity and response to augmentation. Brachial Veins: No evidence of thrombus. Normal compressibility, respiratory phasicity and response to augmentation. Radial Veins: No evidence of thrombus. Normal compressibility, respiratory phasicity and response to augmentation. Ulnar Veins: No evidence of thrombus. Normal compressibility, respiratory phasicity and response to augmentation. Venous Reflux:  None. Other Findings:  None. LEFT UPPER EXTREMITY Internal Jugular Vein: No evidence of thrombus. Normal compressibility, respiratory phasicity and response to augmentation. Subclavian Vein: No evidence of thrombus. Normal compressibility, respiratory phasicity and response to augmentation. Axillary Vein: No evidence of thrombus. Normal compressibility, respiratory phasicity and response to augmentation. Cephalic Vein: No evidence of thrombus. Normal compressibility, respiratory phasicity and response to augmentation. Basilic Vein: Noncompressible basilic vein in the mid arm. There is nonocclusive well-defined heterogeneous echogenicity within the vessel lumen consistent with subocclusive thrombus. Brachial Veins: No evidence of thrombus. Normal compressibility, respiratory phasicity and response to augmentation. Radial Veins: No evidence of thrombus. Normal compressibility, respiratory phasicity and response to augmentation. Ulnar Veins: No evidence of thrombus. Normal compressibility, respiratory phasicity and response to augmentation. Venous Reflux:  None. Other Findings:  None. IMPRESSION: 1. Positive for nonocclusive DVT in the left basilic vein in the mid arm. Given the sonographic appearance, this may represent a subacute thrombus with early  recanalization. 2. No evidence of DVT in the right upper extremity. Electronically Signed   By: Jacqulynn Cadet M.D.   On: 05/29/2015 15:50    EKG Interpretation  Date/Time:  Sunday May 31 2015 05:42:42 EDT Ventricular Rate:  76 PR Interval:  144 QRS Duration: 103 QT Interval:  424 QTC Calculation: 477 R Axis:   58 Text Interpretation:  Sinus rhythm Normal ECG Unchanged Confirmed by Florina Ou  MD, Jenny Reichmann ([id number]) on 05/31/2015 5:46:08 AM        Shanon Rosser, MD 05/31/15 [place], MD 05/31/15 [id number]

## 2015-06-01 ENCOUNTER — Encounter: Payer: Self-pay | Admitting: Medical

## 2015-06-01 NOTE — Progress Notes (Signed)
Quick Note:  Pt has seen results on MyChart and message also sent for patient to call back if any questions. Spoke with pt and she states that she will try the potassium rich foods and see if that will bring her levels up. ______

## 2015-06-02 MED ORDER — POTASSIUM CHLORIDE CRYS ER 10 MEQ PO TBCR: 10.0000 meq | EXTENDED_RELEASE_TABLET | Freq: Every day | ORAL | Status: DC

## 2015-06-02 NOTE — Telephone Encounter (Signed)
I called pt the other day and advised on her labs. At that point I advised eat more k rich foods. But since she went to ED looks like her k has dropped. Will rx k-dur 10 meq to take 1 tab po q day for next week. Notify pt. Will repeat cmp after course of k.

## 2015-06-05 ENCOUNTER — Ambulatory Visit (INDEPENDENT_AMBULATORY_CARE_PROVIDER_SITE_OTHER): Payer: PRIVATE HEALTH INSURANCE | Admitting: Medical

## 2015-06-05 ENCOUNTER — Ambulatory Visit (HOSPITAL_BASED_OUTPATIENT_CLINIC_OR_DEPARTMENT_OTHER)
Admission: RE | Admit: 2015-06-05 | Discharge: 2015-06-05 | Disposition: A | Discharge status: Home / Self Care | Payer: PRIVATE HEALTH INSURANCE | Source: Ambulatory Visit | Attending: Medical | Admitting: Medical

## 2015-06-05 ENCOUNTER — Encounter: Payer: Self-pay | Admitting: Medical

## 2015-06-05 VITALS — BP 136/90 | HR 78 | Temp 98.0°F | Ht 71.0 in | Wt 195.8 lb

## 2015-06-05 DIAGNOSIS — M7122 Synovial cyst of popliteal space [Baker], left knee: Secondary | ICD-10-CM | POA: Diagnosis not present

## 2015-06-05 DIAGNOSIS — E876 Hypokalemia: Secondary | ICD-10-CM | POA: Diagnosis not present

## 2015-06-05 DIAGNOSIS — M79609 Pain in unspecified limb: Secondary | ICD-10-CM | POA: Insufficient documentation

## 2015-06-05 DIAGNOSIS — I82622 Acute embolism and thrombosis of deep veins of left upper extremity: Secondary | ICD-10-CM | POA: Diagnosis not present

## 2015-06-05 MED ORDER — POTASSIUM CHLORIDE CRYS ER 10 MEQ PO TBCR: 10.0000 meq | EXTENDED_RELEASE_TABLET | Freq: Once | ORAL | Status: DC

## 2015-06-05 NOTE — Progress Notes (Signed)
Pre visit review using our clinic review tool, if applicable. No additional management support is needed unless otherwise documented below in the visit note. 

## 2015-06-05 NOTE — Progress Notes (Signed)
Subjective:    Patient ID: Terri Morris, female    DOB: 03/08/1975, 40 y.o.   MRN: [id number]  HPI   Pt in for follow up. She is had lue dvt.   Pt is on xarelto. She feels well. No abnormal bruising reported. No family history of relatives with DVT. No long trip. No hormones rx. No long trips. No trauma to her arm. Pt does smoke.  She did go to ED for some faint pain beneath her left breast. She went to the ED. Pt had studies done. No PE. Pt had mammogram this year and considered negative.  Pt after ED she states discomfort went decreased almost completley. She states got better with ibuprofen and she states now can tell pain was muscular.   Also left lower popliteal pain other day after using bathroom. Since then feel small bulge in area. She thinks baker cyst.  LMP- May 22, 2015. nml and expected.    Review of Systems  Constitutional: Negative for fever, chills and fatigue.  Respiratory: Negative for cough, choking and wheezing.   Cardiovascular: Negative for chest pain and palpitations.  Musculoskeletal: Negative for back pain.       Discomfort under left breast feels better.  Left arm feels better. Does not feel swollen.  Skin: Negative for rash.  Neurological: Negative for dizziness and headaches.  Hematological: Negative for adenopathy. Does not bruise/bleed easily.  Psychiatric/Behavioral: Negative for suicidal ideas, behavioral problems, confusion and sleep disturbance.    Past Medical History  Diagnosis Date  . Anxiety   . Hypertension   . Thyroid disease      Social History   Social History  . Marital Status: Married    Spouse Name: N/A  . Number of Children: N/A  . Years of Education: N/A   Occupational History  . Not on file.   Social History Main Topics  . Smoking status: Heavy Tobacco Smoker -- 1.00 packs/day    Types: Cigarettes  . Smokeless tobacco: Not on file  . Alcohol Use: Yes     Comment: 3 glasses of wine on weekends  . Drug  Use: No  . Sexual Activity: Yes   Other Topics Concern  . Not on file   Social History Narrative    Past Surgical History  Procedure Laterality Date  . Ectopic pregnancy surgery      Family History  Problem Relation Age of Onset  . Hypertension Mother   . Stroke Mother   . Arthritis Mother   . Alcohol abuse Mother   . Diabetes Mother   . Hypertension Father   . Alcohol abuse Father   . Diabetes Father     No Known Allergies  Current Outpatient Prescriptions on File Prior to Visit  Medication Sig Dispense Refill  . Choline Fenofibrate 45 MG capsule Take 1 capsule (45 mg total) by mouth daily. 30 capsule 3  . lisinopril (PRINIVIL,ZESTRIL) 10 MG tablet Take 1 tablet (10 mg total) by mouth daily. 30 tablet 3  . potassium chloride (K-DUR,KLOR-CON) 10 MEQ tablet Take 1 tablet (10 mEq total) by mouth daily. 7 tablet 1  . rivaroxaban (XARELTO) 20 MG TABS tablet Take 1 tablet (20 mg total) by mouth daily with supper. 30 tablet 1   No current facility-administered medications on file prior to visit.    BP 136/90 mmHg  Pulse 78  Temp(Src) 98 F (36.7 C) (Oral)  Ht 5\' 11"  (1.803 m)  Wt 195 lb 12.8 oz (88.814 kg)  BMI 27.32 kg/m2  SpO2 99%  LMP 05/22/2015       Objective:   Physical Exam  General- No acute distress. Pleasant patient. Neck- Full range of motion, no jvd Lungs- Clear, even and unlabored. Heart- regular rate and rhythm. Neurologic- CNII- XII grossly intact.  Lt arm- does look thinner. No brachial region bruit. Rt arm- on inspection looks normal.  Lt lower ext- lt popliteal faint pain. Slight bulge. Negative homans signs. Rt lower ext- no bulge. Neg homans sign.        Assessment & Plan:  For your rt upper ext dvt continue the xarelto. I did send in rx to start after you finish start pack.  Smoking is a risk factor for dvt so encourage to stop and can give med in future to help you stop if you wish.  Will go ahead and refer you to hematologist  to work up cause of dvt.  You have some left popliteal pain. Could be baker cyst but will see if dvt as well?  With low k will start you on k-dur. Repeat lab in 10-14 days.   Follow up in 4 weeks or as needed  Dineen Conradt, Percell Miller, [employer]

## 2015-06-05 NOTE — Patient Instructions (Addendum)
For your rt upper ext dvt continue the xarelto. I did send in rx to start after you finish start pack.  Smoking is a risk factor for dvt so encourage to stop and can give med in future to help you stop if you wish.  Will go ahead and refer you to hematologist to work up cause of dvt.  You have some left popliteal pain. Could be baker cyst but will see if dvt as well?(2:30 appointment)  With low k will start you on k-dur. Repeat lab in 10-14 days.   Follow up in 4 weeks or as needed

## 2015-07-06 ENCOUNTER — Ambulatory Visit (INDEPENDENT_AMBULATORY_CARE_PROVIDER_SITE_OTHER): Payer: PRIVATE HEALTH INSURANCE | Admitting: Medical

## 2015-07-06 ENCOUNTER — Telehealth: Payer: Self-pay | Admitting: Medical

## 2015-07-06 VITALS — BP 160/98 | HR 105 | Temp 99.0°F | Ht 71.0 in | Wt 191.4 lb

## 2015-07-06 DIAGNOSIS — Z720 Tobacco use: Secondary | ICD-10-CM

## 2015-07-06 DIAGNOSIS — E876 Hypokalemia: Secondary | ICD-10-CM | POA: Diagnosis not present

## 2015-07-06 DIAGNOSIS — R739 Hyperglycemia, unspecified: Secondary | ICD-10-CM

## 2015-07-06 DIAGNOSIS — I10 Essential (primary) hypertension: Secondary | ICD-10-CM

## 2015-07-06 DIAGNOSIS — I82622 Acute embolism and thrombosis of deep veins of left upper extremity: Secondary | ICD-10-CM

## 2015-07-06 DIAGNOSIS — F17200 Nicotine dependence, unspecified, uncomplicated: Secondary | ICD-10-CM

## 2015-07-06 LAB — COMPREHENSIVE METABOLIC PANEL
ALT: 29 U/L (ref 0–35)
AST: 33 U/L (ref 0–37)
Albumin: 4.7 g/dL (ref 3.5–5.2)
Alkaline Phosphatase: 42 U/L (ref 39–117)
BUN: 5 mg/dL — ABNORMAL LOW (ref 6–23)
CO2: 25 meq/L (ref 19–32)
Calcium: 9.7 mg/dL (ref 8.4–10.5)
Chloride: 104 mEq/L (ref 96–112)
Creatinine, Ser: 0.84 mg/dL (ref 0.40–1.20)
GFR: 95.65 mL/min (ref >60.00)
GLUCOSE: 116 mg/dL — AB (ref 70–99)
POTASSIUM: 4 meq/L (ref 3.5–5.1)
Sodium: 137 mEq/L (ref 135–145)
Total Bilirubin: 0.7 mg/dL (ref 0.2–1.2)
Total Protein: 7.9 g/dL (ref 6.0–8.3)

## 2015-07-06 MED ORDER — LISINOPRIL 10 MG PO TABS: 10.0000 mg | ORAL_TABLET | Freq: Every day | ORAL | Status: DC

## 2015-07-06 MED ORDER — POTASSIUM CHLORIDE CRYS ER 10 MEQ PO TBCR: EXTENDED_RELEASE_TABLET | ORAL | Status: DC

## 2015-07-06 NOTE — Patient Instructions (Addendum)
For DVT follow up with Dr. Marin Olp. He will determine possible etiology and length of treatment.(show him you distal bicep area as well)  For your low k will repeat cmp today.  For bp will increase lisinopril to 20 mg a day. Check bp daily. If not coming down may need to add additional class or change type.  Please consider stopping smoking or cut back.   Follow up in 2-3 weeks by phone or my chart. Update [place] on bp readings. Then make adjustments.

## 2015-07-06 NOTE — Progress Notes (Signed)
Pre visit review using our clinic tool,if applicable. No additional management support is needed unless otherwise documented below in the visit note.  

## 2015-07-06 NOTE — Telephone Encounter (Signed)
rx k-dur sent in. Future order cmp and a1-c put in.

## 2015-07-06 NOTE — Progress Notes (Signed)
Subjective:    Patient ID: Terri Morris, female    DOB: 07/21/1975, 40 y.o.   MRN: [id number]  HPI   Pt in for follow up.   Pt arm is mild painful at times. Distal medial bicep area on left side. But better than before. Actually little different region of pain. When has is mild and transient..  Pt did get xarelto filled. Pt states cost was minimal. Pt has been on med continuous. No bruising noted.  Pt has appointment with Dr. Marin Olp on Friday.  Pt has lost weight since last visit since last visit. Diet and exercise.  Pt is still smoking. Not ready to quit yet.  Pt has been on k tabs since last visit. Has low k history.  Pt states at home when checking her bp is high at times. Most of time over 140/90. One time as high as [id number] systolic. But no symptoms associated.    Review of Systems  Constitutional: Negative for chills, diaphoresis and fatigue.  Respiratory: Negative for cough, chest tightness and stridor.   Cardiovascular: Negative for palpitations.  Gastrointestinal: Negative for abdominal distention.  Musculoskeletal: Negative for back pain.  Neurological: Negative for dizziness and headaches.  Hematological: Negative for adenopathy. Does not bruise/bleed easily.  Psychiatric/Behavioral: Negative for behavioral problems and confusion.    Past Medical History  Diagnosis Date  . Anxiety   . Hypertension   . Thyroid disease      Social History   Social History  . Marital Status: Married    Spouse Name: N/A  . Number of Children: N/A  . Years of Education: N/A   Occupational History  . Not on file.   Social History Main Topics  . Smoking status: Heavy Tobacco Smoker -- 1.00 packs/day    Types: Cigarettes  . Smokeless tobacco: Not on file  . Alcohol Use: Yes     Comment: 3 glasses of wine on weekends  . Drug Use: No  . Sexual Activity: Yes   Other Topics Concern  . Not on file   Social History Narrative    Past Surgical History  Procedure  Laterality Date  . Ectopic pregnancy surgery      Family History  Problem Relation Age of Onset  . Hypertension Mother   . Stroke Mother   . Arthritis Mother   . Alcohol abuse Mother   . Diabetes Mother   . Hypertension Father   . Alcohol abuse Father   . Diabetes Father     No Known Allergies  Current Outpatient Prescriptions on File Prior to Visit  Medication Sig Dispense Refill  . Choline Fenofibrate 45 MG capsule Take 1 capsule (45 mg total) by mouth daily. 30 capsule 3  . lisinopril (PRINIVIL,ZESTRIL) 10 MG tablet Take 1 tablet (10 mg total) by mouth daily. 30 tablet 3  . potassium chloride (K-DUR,KLOR-CON) 10 MEQ tablet Take 1 tablet (10 mEq total) by mouth once. 30 tablet 0  . rivaroxaban (XARELTO) 20 MG TABS tablet Take 1 tablet (20 mg total) by mouth daily with supper. 30 tablet 1   No current facility-administered medications on file prior to visit.    BP 162/105 mmHg  Pulse 105  Temp(Src) 99 F (37.2 C) (Oral)  Ht 5\' 11"  (1.803 m)  Wt 191 lb 6.4 oz (86.818 kg)  BMI 26.71 kg/m2  SpO2 100%  LMP 06/24/2015       Objective:   Physical Exam  General Mental Status- Alert. General Appearance- Not in acute  distress.   Skin General: Color- Normal Color. Moisture- Normal Moisture.  Neck Carotid Arteries- Normal color. Moisture- Normal Moisture. No carotid bruits. No JVD.  Chest and Lung Exam Auscultation: Breath Sounds:-Normal.  Cardiovascular Auscultation:Rythm- Regular. Murmurs & Other Heart Sounds:Auscultation of the heart reveals- No Murmurs.  Abdomen Inspection:-Inspeection Normal. Palpation/Percussion:Note:No mass. Palpation and Percussion of the abdomen reveal- Non Tender, Non Distended + BS, no rebound or guarding.   Neurologic Cranial Nerve exam:- CN III-XII intact(No nystagmus), symmetric smile. Strength:- 5/5 equal and symmetric strength both upper and lower extremities.  Left arm- no bruits. Possible small lipima or ganglion cyst  medial distal bicep area.      Assessment & Plan:  For DVT follow up with Dr. Marin Olp. He will determine possible etiology and length of treatment.  For your low k will repeat cmp today.  For bp will increase lisinopril to 20 mg a day. Check bp daily. If not coming down may need to add additional class or change type.  Please consider stopping smoking or cut back.   Follow up in 2-3 weeks by phone or my chart. Update [place] on bp readings. Then make adjustments.  Hazaiah Edgecombe, Percell Miller, PA-C

## 2015-07-07 ENCOUNTER — Encounter: Payer: Self-pay | Admitting: Medical

## 2015-07-08 NOTE — Telephone Encounter (Signed)
Will you send a letter with the same instructions as result note.

## 2015-07-09 ENCOUNTER — Ambulatory Visit: Payer: PRIVATE HEALTH INSURANCE

## 2015-07-09 ENCOUNTER — Other Ambulatory Visit: Payer: PRIVATE HEALTH INSURANCE

## 2015-07-09 ENCOUNTER — Ambulatory Visit: Payer: PRIVATE HEALTH INSURANCE | Admitting: Hematology & Oncology

## 2015-07-09 ENCOUNTER — Encounter: Payer: Self-pay | Admitting: Medical

## 2015-07-09 NOTE — Telephone Encounter (Signed)
MyChart message sent to pt with same instructions as per the AVS form 07-06-15.

## 2015-07-16 ENCOUNTER — Encounter: Payer: Self-pay | Admitting: Medical

## 2015-07-16 MED ORDER — AMLODIPINE BESYLATE 5 MG PO TABS: 5.0000 mg | ORAL_TABLET | Freq: Every day | ORAL | Status: DC

## 2015-07-16 NOTE — Telephone Encounter (Signed)
I got my chart message 143/99. Pt was taking lisinopril 20 mg a day. Will you call her and ask if she has any other readings. If all of her reading area similar to this then I sent in amlodipine 5 mg tab. Take add to current regimen. Follow up in 3 weeks for bp check. Have her check her bp every other day and bring in readings.

## 2015-07-17 NOTE — Telephone Encounter (Signed)
Spoke with pt and phone was disconnected. Will try again later.

## 2015-07-20 ENCOUNTER — Telehealth: Payer: Self-pay | Admitting: Medical

## 2015-07-20 NOTE — Telephone Encounter (Signed)
Relation to [id number] Call back number:[phone] Pharmacy: [id number] - [place], [place] [phone] (Phone) [phone] (Fax)         Reason for call:  Patient requesting a refill lisinopril (PRINIVIL,ZESTRIL) 20 MG tablet (20 MG doesn't reflect chart patient states MG was increased at last appointment) please advise

## 2015-07-21 ENCOUNTER — Telehealth: Payer: Self-pay | Admitting: Medical

## 2015-07-21 NOTE — Telephone Encounter (Signed)
Please advise if we need to change the lisinopril to 20 mg.

## 2015-07-21 NOTE — Telephone Encounter (Signed)
Pt called in. She says that she would like to speak with CMA directly in regards to a medication discussed with pcp. Pt says that she is following up.    [id number] - work

## 2015-07-21 NOTE — Telephone Encounter (Signed)
Left message for pt that she should have a refill at the pharmacy. Pt was asked to call back with any questions.

## 2015-07-21 NOTE — Telephone Encounter (Signed)
I had asked her to give me update on bp readings and come in for 2 wk check up. It appears she does not want to come in for check up.(She did send me one bp check and it was borderline if I remember correctly. You can call in 20 mg of lisinopril #30 with 3 refills. Stop the 10 mg tabs. But confirm that with 20 mg lisinopril she was taking that majority of bp were less than 140/90. I believe I had asked her to take 2 of the 10 mg tabs. Follow up in one month for actual visit bp check in office.

## 2015-07-22 MED ORDER — LISINOPRIL 20 MG PO TABS: 20.0000 mg | ORAL_TABLET | Freq: Every day | ORAL | Status: DC

## 2015-07-22 NOTE — Telephone Encounter (Signed)
Rx has been sent to pharmacy for pt. I cancelled the 10 mg dose per E. Saguier request. I sent in the 20mg  dose to the pharmacy and the pharmacist stated that the new Rx would be filled.

## 2015-07-25 ENCOUNTER — Telehealth: Payer: Self-pay | Admitting: Medical

## 2015-07-26 NOTE — Telephone Encounter (Signed)
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 I notified her and then referred her. Bu looks like she has not gone to gyn appointment. It looks like gyn office that I referred her to called her office but appointment never made. Then [facility] sent letter wanting to know if she had bee evaluated/treated. Will you call pt and reminder her of importance of appointment. I will send this note to Anderson Malta and Marita [place] so they can re-refer again.

## 2015-07-27 ENCOUNTER — Encounter: Payer: Self-pay | Admitting: Hematology & Oncology

## 2015-07-27 ENCOUNTER — Ambulatory Visit (HOSPITAL_BASED_OUTPATIENT_CLINIC_OR_DEPARTMENT_OTHER): Payer: PRIVATE HEALTH INSURANCE | Admitting: Hematology & Oncology

## 2015-07-27 ENCOUNTER — Ambulatory Visit (HOSPITAL_BASED_OUTPATIENT_CLINIC_OR_DEPARTMENT_OTHER): Payer: PRIVATE HEALTH INSURANCE

## 2015-07-27 ENCOUNTER — Other Ambulatory Visit: Payer: PRIVATE HEALTH INSURANCE

## 2015-07-27 VITALS — BP 159/99 | HR 98 | Temp 97.9°F | Resp 18 | Ht 71.0 in

## 2015-07-27 DIAGNOSIS — I82A12 Acute embolism and thrombosis of left axillary vein: Secondary | ICD-10-CM

## 2015-07-27 DIAGNOSIS — Z720 Tobacco use: Secondary | ICD-10-CM

## 2015-07-27 MED ORDER — RIVAROXABAN 20 MG PO TABS: 20.0000 mg | ORAL_TABLET | Freq: Every day | ORAL | Status: DC

## 2015-07-27 NOTE — Progress Notes (Signed)
Referral MD  Reason for Referral: DVT of the LEFT basilic vein  Chief Complaint  Patient presents with  . Other    New Patient  : I had a blood clot in my left arm.  HPI: Terri Morris Is a very nice 40 year old African-American female. She has history of hypertension.  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  Back in May, she began to have some discomfort in the left arm. She denied any type of trauma to the left arm. She had no swelling. She just felt a little bit of discomfort.  She had no cough or shortness of breath. She had no chest wall pain. There is no leg swelling. She was found have a Baker's cyst behind the left knee.  She did have a Doppler of the left arm. This did show a partially occlusive thrombus in the basilic vein. She was started on Xarelto. She was then referred to the [place] for an evaluation.  Of note, she has had 4 pregnancies. 3 pregnancies were ectopic. She has surgery for these.  There is no family history of blood clots. There is history of breast cancer in the family.  She does smoke. She was smokes half pack per day.  She's had no bleeding. She does have heavy cycles because of the Xarelto.  Tomorrow is her birthday. She probably will go out to [facility] for dinner.  Her appetite is good. She is not a vegetarian. She's had no change in bowel or bladder habits. She has had her mammograms.  Overall, her performance status is ECOG 0.     Past Medical History  Diagnosis Date  . Anxiety   . Hypertension   . Thyroid disease   :  Past Surgical History  Procedure Laterality Date  . Ectopic pregnancy surgery    :   Current outpatient prescriptions:  .  amLODipine (NORVASC) 5 MG tablet, Take 1 tablet (5 mg total) by mouth daily., Disp: 30 tablet, Rfl: 3 .  Choline Fenofibrate (FENOFIBRIC ACID) 45 MG CPDR, TAKE 1 CAPSULE(45 MG) BY MOUTH DAILY, Disp: 30 capsule, Rfl: 0 .  lisinopril (PRINIVIL,ZESTRIL) 20 MG tablet, Take 1  tablet (20 mg total) by mouth daily., Disp: 30 tablet, Rfl: 1 .  rivaroxaban (XARELTO) 20 MG TABS tablet, Take 1 tablet (20 mg total) by mouth daily with supper., Disp: 30 tablet, Rfl: 6:  :  No Known Allergies:  Family History  Problem Relation Age of Onset  . Hypertension Mother   . Stroke Mother   . Arthritis Mother   . Alcohol abuse Mother   . Diabetes Mother   . Hypertension Father   . Alcohol abuse Father   . Diabetes Father   :  Social History   Social History  . Marital Status: Married    Spouse Name: N/A  . Number of Children: N/A  . Years of Education: N/A   Occupational History  . Not on file.   Social History Main Topics  . Smoking status: Heavy Tobacco Smoker -- 1.00 packs/day    Types: Cigarettes  . Smokeless tobacco: Not on file  . Alcohol Use: 0.0 oz/week    0 Standard drinks or equivalent per week     Comment: 3 glasses of wine on weekends  . Drug Use: No  . Sexual Activity: Yes   Other Topics Concern  . Not on file   Social History Narrative  :  Pertinent items are noted in HPI.  Exam: @IPVITALS @ Well-developed and well-nourished Afro-American female in no obvious distress. Vital signs show a temperature of 97.9. Pulse 98. Blood pressure 159/99. Weight is 191 pounds. Head and neck exam shows no ocular or oral lesions. She has no palpable cervical or supraclavicular lymph nodes. Lungs are clear. Cardiac exam regular rate and rhythm with no murmurs, rubs or bruits. Abdomen is soft. She has good bowel sounds. There is no fluid wave. There is no palpable liver or spleen tip. Back exam shows no tenderness over the spine, ribs or hips. Extremities shows no swelling of the left upper arm. She has good pulses in the distal extremities. She has good range of motion of her joints. There is no obvious axillary adenopathy or fullness in the left axilla. Lower extremities shows no clubbing, cyanosis or edema. Skin exam shows no rashes, ecchymoses or petechia.  Neurological exam shows no focal neurological deficits.   No results for input(s): WBC, HGB, HCT, PLT in the last 72 hours. No results for input(s): NA, K, CL, CO2, GLUCOSE, BUN, CREATININE, CALCIUM in the last 72 hours.  Blood smear review:  None  Pathology: None     Assessment and Plan: Terri Morris is a very charming 40 year old Afro-American female. She has a thrombus in the left upper extremity. I must say that it is unusual to see upper extremity thrombosis.  I suspect that are hypercoagulable studies will be normal. However, the palate she's had 3 miscarriages might indicate some underlying thrombophilic abnormality.  I think she will need 6 months of anticoagulation. After that, we can then get her a 1 year of low-dose anticoagulation.  I'll like to see her back in 6 weeks. We see her back, we will go ahead and get a Doppler of her left arm to see how the thrombus is responding to anticoagulation.  I cannot find anything on her physical exam that would suggest an underlying malignancy.  She does smoke. This might be the risk factor that we need for a thromboembolic event. She is not on oral contraceptives. She is not diabetic.  I spent 45 minutes with she and her husband. They're both very nice. It was fun talking with him.

## 2015-07-27 NOTE — Telephone Encounter (Signed)
Patient was called 3x by [facility] with no call back. Please advise

## 2015-07-27 NOTE — Telephone Encounter (Signed)
Pt needs to be aware of importance of referral. She needs to be reminded this is follow up from past. And that gyn has been trying to call her regarding the abnormal pap. Please document she was advised. Give her number to gyn office that has been trying to call her.

## 2015-07-27 NOTE — Telephone Encounter (Signed)
She had

## 2015-07-28 LAB — PROTEIN C ACTIVITY: Protein C-Functional: 151 % (ref 73–180)

## 2015-07-28 LAB — PROTEIN S, TOTAL: PROTEIN S AG TOTAL: 132 % (ref 60–150)

## 2015-07-28 LAB — PROTEIN C, TOTAL: PROTEIN C ANTIGEN: 106 % (ref 60–150)

## 2015-07-28 LAB — ANTITHROMBIN III: ANTITHROMBIN ACTIVITY: 149 % — AB (ref 75–135)

## 2015-07-28 LAB — PROTEIN S ACTIVITY

## 2015-07-29 LAB — LUPUS ANTICOAGULANT PANEL
DRVVT MIX: 92.9 s — AB (ref 0.0–47.0)
DRVVT: 127.6 s — AB (ref 0.0–47.0)
PTT-LA: 46.2 s (ref 0.0–51.9)
dRVVT Confirm: 1.5 ratio — ABNORMAL HIGH (ref 0.8–1.2)

## 2015-07-29 LAB — BETA-2-GLYCOPROTEIN I ABS, IGG/M/A
Beta-2 Glyco 1 IgM: <9 GPI IgM units (ref 0–32)
Beta-2 Glycoprotein I Ab, IgG: <9 GPI IgG units (ref 0–20)

## 2015-07-29 LAB — CARDIOLIPIN ANTIBODIES, IGG, IGM, IGA
Anticardiolipin Ab,IgA,Qn: <9 APL U/mL (ref 0–11)
Anticardiolipin Ab,IgG,Qn: <9 GPL U/mL (ref 0–14)

## 2015-07-29 NOTE — Addendum Note (Signed)
Addended by: Burney Gauze R on: 07/29/2015 05:48 PM   Modules accepted: Orders

## 2015-07-31 LAB — PROTHROMBIN GENE MUTATION

## 2015-07-31 LAB — FACTOR 5 LEIDEN

## 2015-08-20 ENCOUNTER — Encounter: Payer: PRIVATE HEALTH INSURANCE | Admitting: Obstetrics & Gynecology

## 2015-08-25 ENCOUNTER — Encounter: Payer: Self-pay | Admitting: Medical

## 2015-08-30 ENCOUNTER — Other Ambulatory Visit: Payer: Self-pay | Admitting: Medical

## 2015-09-03 ENCOUNTER — Ambulatory Visit (INDEPENDENT_AMBULATORY_CARE_PROVIDER_SITE_OTHER): Payer: PRIVATE HEALTH INSURANCE | Admitting: Obstetrics & Gynecology

## 2015-09-03 ENCOUNTER — Encounter: Payer: Self-pay | Admitting: Obstetrics & Gynecology

## 2015-09-03 ENCOUNTER — Other Ambulatory Visit (HOSPITAL_COMMUNITY)
Admission: RE | Admit: 2015-09-03 | Discharge: 2015-09-03 | Disposition: A | Discharge status: Home / Self Care | Payer: PRIVATE HEALTH INSURANCE | Source: Ambulatory Visit | Attending: Obstetrics & Gynecology | Admitting: Obstetrics & Gynecology

## 2015-09-03 VITALS — BP 148/85 | HR 103 | Ht 71.0 in | Wt 196.0 lb

## 2015-09-03 DIAGNOSIS — D069 Carcinoma in situ of cervix, unspecified: Secondary | ICD-10-CM | POA: Diagnosis not present

## 2015-09-03 DIAGNOSIS — N898 Other specified noninflammatory disorders of vagina: Secondary | ICD-10-CM | POA: Diagnosis present

## 2015-09-03 DIAGNOSIS — R87613 High grade squamous intraepithelial lesion on cytologic smear of cervix (HGSIL): Secondary | ICD-10-CM

## 2015-09-03 NOTE — Patient Instructions (Signed)
Colposcopy, Care After Refer to this sheet in the next few weeks. These instructions provide you with information on caring for yourself after your procedure. Your health care provider may also give you more specific instructions. Your treatment has been planned according to current medical practices, but problems sometimes occur. Call your health care provider if you have any problems or questions after your procedure. WHAT TO EXPECT AFTER THE PROCEDURE  After your procedure, it is typical to have the following:  Cramping. This often goes away in a few minutes.  Soreness. This may last for 2 days.  Lightheadedness. Lie down for a few minutes if this occurs. You may also have some bleeding or dark discharge for a few days. You may need to wear a sanitary pad during this time. HOME CARE INSTRUCTIONS  Avoid sex, douching, and using tampons for 3 days or as directed by your health care provider.  Only take over-the-counter or prescription medicines as directed by your health care provider. Do not take aspirin because it can cause bleeding.  Continue to take birth control pills if you are on them.  Not all test results are available during your visit. If your test results are not back during the visit, make an appointment with your health care provider to find out the results. Do not assume everything is normal if you have not heard from your health care provider or the medical facility. It is important for you to follow up on all of your test results.  Follow your health care provider's advice regarding activity, follow-up visits, and follow-up Pap tests. SEEK MEDICAL CARE IF:  You develop a rash.  You have problems with your medicine. SEEK IMMEDIATE MEDICAL CARE IF:  You are bleeding heavily or are passing blood clots.  You have a fever.  You have abnormal vaginal discharge.  You are having cramps that do not go away after taking your pain medicine.  You feel lightheaded, dizzy, or  faint.  You have stomach pain.   This information is not intended to replace advice given to you by your health care provider. Make sure you discuss any questions you have with your health care provider.   Document Released: 10/17/2012 Document Reviewed: 10/17/2012 Elsevier Interactive Patient Education 2016 [place] Electrosurgical Excision Procedure Loop electrosurgical excision procedure (LEEP) is the removal of a portion of the lower part of the uterus (cervix). The procedure is done when there are significantly abnormal cervical cell changes. Abnormal cell changes of the cervix can lead to cancer if left in place and untreated.  The LEEP procedure itself typically only takes a few minutes. Often, it may be done in your caregiver's office. The procedure is considered safe for those who wish to get pregnant or are trying to get pregnant. Only under rare circumstances should this procedure be done if you are pregnant. LET YOUR CAREGIVER KNOW ABOUT:  Whether you are pregnant or late for your last menstrual period.  Allergies to foods or medicines.  All the medicines you are taking includingherbs, eyedrops, and over-the-counter medicines, and creams.  Use of steroids (by mouth or creams).  Previous problems with anesthetics or numbing medicine.  Previous gynecological surgery.  History of blood clots or bleeding problems.  Any recent or current vaginal infections (herpes, sexually transmitted infections).  Other health problems. RISKS AND COMPLICATIONS  Bleeding.  Infection.  Injury to the vagina, bladder, or rectum.  Very rare obstruction of the cervical opening that causes problems during menstruation (cervical  stenosis). BEFORE THE PROCEDURE  Do not take aspirin or blood thinners (anticoagulants) for 1 week before the procedure, or as told by your caregiver.  Eat a light meal before the procedure.  Ask your caregiver about changing or stopping your  regular medicines.  You may be given a pain reliever 1 or 2 hours before the procedure. PROCEDURE   A tool (speculum) is placed in the vagina. This allows your caregiver to see the cervix.  An iodine stain is applied to the cervix to find the area of abnormal cells to be removed.  Medicine is injected to numb the cervix (local anesthetic).   Electricity is passed through a thin wire loop which is then used to remove (cauterize) a small segment of the affected cervix.  Light electrocautery is used to seal any small blood vessels and prevent bleeding.  A paste may be applied to the cauterized area of the cervix to help prevent bleeding.  The tissue sample is sent to the lab. It is examined under the microscope. AFTER THE PROCEDURE  Have someone drive you home.  You may have slight to moderate cramping.  You may notice a black vaginal discharge from the paste used on the cervix to prevent bleeding. This is normal.  Watch for excessive bleeding. This requires immediate medical care.  Ask when your test results will be ready. Make sure you get your test results.   This information is not intended to replace advice given to you by your health care provider. Make sure you discuss any questions you have with your health care provider.   Document Released: 03/19/2002 Document Revised: 03/21/2011 Document Reviewed: 06/08/2010 Elsevier Interactive Patient Education [employer].

## 2015-09-03 NOTE — Progress Notes (Signed)
Pt referred for management of abnormal  PAP. She denies prev abnormal PAP. She was dx'd with trich in 01/2015 with the abnormal  PAP she reports that she was treated at that time.  She denies any other sx.  She is married and monogamous.    02/02/2015: Adequacy Reason Satisfactory for evaluation, endocervical/transformation zone component PRESENT. 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 (Tx'd 01/2015)   Patient given informed consent, signed copy in the chart, time out was performed.  Placed in lithotomy position. Cervix viewed with speculum and colposcope after application of acetic acid.   Colposcopy adequate?  yes Acetowhite lesions? yes Punctation? no Mosaicism?  no Abnormal vasculature?  no Biopsies? Yes x 4 the entire TZ was thickened and beefy ECC? yes   Patient was given post procedure instructions.  She will return in 2 weeks for results.  Shelsea Hangartner L. Harraway-Smith, M.D., Cherlynn June

## 2015-09-04 ENCOUNTER — Other Ambulatory Visit: Payer: Self-pay | Admitting: Obstetrics & Gynecology

## 2015-09-04 DIAGNOSIS — B9689 Other specified bacterial agents as the cause of diseases classified elsewhere: Secondary | ICD-10-CM

## 2015-09-04 DIAGNOSIS — N760 Acute vaginitis: Principal | ICD-10-CM

## 2015-09-04 LAB — WET PREP BY MOLECULAR PROBE
Candida species: NEGATIVE
GARDNERELLA VAGINALIS: POSITIVE — AB
Trichomonas vaginosis: NEGATIVE

## 2015-09-04 LAB — CYTOLOGY - PAP

## 2015-09-04 MED ORDER — METRONIDAZOLE 500 MG PO TABS: 500.0000 mg | ORAL_TABLET | Freq: Two times a day (BID) | ORAL | 0 refills | Status: DC

## 2015-09-09 ENCOUNTER — Telehealth: Payer: Self-pay

## 2015-09-09 NOTE — Telephone Encounter (Signed)
-----   Message from Lavonia Drafts, MD sent at 09/04/2015  8:41 PM EDT ----- Please call pt. 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 She needs a LEEP.  She also needs tx for BV prior to the LEEP.  Rx sent to pharmacy.  Appt may be scheduled when I return.   Thx, clh-S

## 2015-09-09 NOTE — Telephone Encounter (Signed)
Left message for patient to return call to office for results. Margarit Minshall RNBSN 

## 2015-09-09 NOTE — Telephone Encounter (Signed)
Patient called back and made aware the results of bacterial vaginosis and she needs to pick up antibiotic from pharmacy for the treatment.   Patient then made aware the need for a LEEP procedure. Patient scheduled for end of the month. Kathrene Alu RNBSN

## 2015-09-16 ENCOUNTER — Encounter: Payer: Self-pay | Admitting: Hematology & Oncology

## 2015-09-16 ENCOUNTER — Other Ambulatory Visit: Payer: PRIVATE HEALTH INSURANCE

## 2015-09-16 ENCOUNTER — Ambulatory Visit (HOSPITAL_BASED_OUTPATIENT_CLINIC_OR_DEPARTMENT_OTHER)
Admission: RE | Admit: 2015-09-16 | Discharge: 2015-09-16 | Disposition: A | Discharge status: Home / Self Care | Payer: PRIVATE HEALTH INSURANCE | Source: Ambulatory Visit | Attending: Hematology & Oncology | Admitting: Hematology & Oncology

## 2015-09-16 ENCOUNTER — Ambulatory Visit (HOSPITAL_BASED_OUTPATIENT_CLINIC_OR_DEPARTMENT_OTHER): Payer: PRIVATE HEALTH INSURANCE | Admitting: Hematology & Oncology

## 2015-09-16 ENCOUNTER — Ambulatory Visit: Payer: PRIVATE HEALTH INSURANCE | Admitting: Obstetrics & Gynecology

## 2015-09-16 VITALS — BP 149/92 | HR 108 | Temp 98.5°F | Resp 18 | Ht 71.0 in | Wt 195.0 lb

## 2015-09-16 DIAGNOSIS — I82A12 Acute embolism and thrombosis of left axillary vein: Secondary | ICD-10-CM

## 2015-09-16 DIAGNOSIS — I82622 Acute embolism and thrombosis of deep veins of left upper extremity: Secondary | ICD-10-CM

## 2015-09-16 NOTE — Progress Notes (Signed)
Hematology and Oncology Follow Up Visit  Terri Morris [id number] 11/24/1975 39 y.o. 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 She is doing pretty well. We did go ahead and get a Doppler of her left arm. This is done today. Nose pries, there is no evidence of any thrombus.  She had a hypercoagulable study done. The only positive result was a lupus anticoagulant. For some reason, it seems like this lab detects lupus anticoagulants and most of our patients. I'm not sure if this is truly clinically positive. We are rechecking a lupus anticoagulant on her today.  She is feeling well. She is working. She is not having any left arm swelling.  She did have some heavy monthly cycles when she first started the Xarelto. These seem to have normalized for her.  She has had no cough or shortness of breath. There's been no chest wall pain.  Overall, her performance status is ECOG 0. Medications:  Current Outpatient Prescriptions:  .  amLODipine (NORVASC) 5 MG tablet, Take 1 tablet (5 mg total) by mouth daily., Disp: 30 tablet, Rfl: 3 .  Choline Fenofibrate (FENOFIBRIC ACID) 45 MG CPDR, TAKE 1 CAPSULE(45 MG) BY MOUTH DAILY, Disp: 30 capsule, Rfl: 0 .  lisinopril (PRINIVIL,ZESTRIL) 20 MG tablet, Take 1 tablet (20 mg total) by mouth daily., Disp: 30 tablet, Rfl: 1 .  metroNIDAZOLE (FLAGYL) 500 MG tablet, Take 1 tablet (500 mg total) by mouth 2 (two) times daily., Disp: 14 tablet, Rfl: 0 .  rivaroxaban (XARELTO) 20 MG TABS tablet, Take 1 tablet (20 mg total) by mouth daily with supper., Disp: 30 tablet, Rfl: 6  Allergies: No Known Allergies  Past Medical History, Surgical history, Social history, and Family History were reviewed and updated.  Review of Systems: As above  Physical Exam:  height is 5\' 11"  (1.803 m) and  weight is 195 lb (88.5 kg). Her oral temperature is 98.5 F (36.9 C). Her blood pressure is 149/92 (abnormal) and her pulse is 108 (abnormal). Her respiration is 18.   Wt Readings from Last 3 Encounters:  09/16/15 195 lb (88.5 kg)  09/03/15 196 lb (88.9 kg)  07/06/15 191 lb 6.4 oz (86.8 kg)     Well-developed and well-nourished African American female in no obvious distress. Head and neck exam shows no ocular or oral lesions. There are no palpable cervical or supraclavicular lymph nodes. Lungs are clear bilaterally. Cardiac exam regular rate and rhythm with no murmurs, rubs or bruits. Abdomen is soft. She has good bowel sounds. There is no fluid wave. There is no palpable liver or spleen tip. Externally shows no clubbing, cyanosis or edema. There is no swelling in the left arm. No obvious venous cord is noted in the left arm. Neurological exam shows no focal neurological deficits.  Lab Results  Component Value Date   WBC 5.6 05/31/2015   HGB 12.4 05/31/2015   HCT 35.3 (L) 05/31/2015   MCV 100.9 (H) 05/31/2015   PLT 317 05/31/2015     Chemistry      Component Value Date/Time   NA 137 07/06/2015 1044   K 4.0 07/06/2015 1044   CL 104 07/06/2015 1044   CO2 25 07/06/2015 1044   BUN 5 (L) 07/06/2015 1044   CREATININE 0.84 07/06/2015 1044  Component Value Date/Time   CALCIUM 9.7 07/06/2015 1044   ALKPHOS 42 07/06/2015 1044   AST 33 07/06/2015 1044   ALT 29 07/06/2015 1044   BILITOT 0.7 07/06/2015 1044         Impression and Plan: Terri Morris is  A 40 year old African American female. She had a thrombus in the left arm. The ultrasound today does not show the thrombus. Ultimately, this will be resolved.  It is hard to say it is lupus anticoagulant is truly positive. Again, it seems like the lab that we are now using fines lupus anticoagulants and over 75% of our patients. I am not sure as to why this truly is.  We are repeating this today. If it is still positive, and I  might want to consider putting her on low-dose aspirin.  I will continue the Xarelto for another 3 months. At that point on, I might just want to stop the Xarelto and keep her on low-dose aspirin.   Volanda Napoleon, MD 9/6/20174:47 PM

## 2015-09-19 LAB — LUPUS ANTICOAGULANT PANEL
DRVVT CONFIRM: 1.3 ratio — AB (ref 0.8–1.2)
PTT-LA: 34.3 s (ref 0.0–51.9)
dRVVT Mix: 59 s — ABNORMAL HIGH (ref 0.0–47.0)
dRVVT: 77.5 s — ABNORMAL HIGH (ref 0.0–47.0)

## 2015-10-02 ENCOUNTER — Other Ambulatory Visit: Payer: Self-pay | Admitting: Medical

## 2015-10-05 ENCOUNTER — Other Ambulatory Visit: Payer: Self-pay

## 2015-10-05 ENCOUNTER — Encounter: Payer: Self-pay | Admitting: Medical

## 2015-10-05 ENCOUNTER — Other Ambulatory Visit: Payer: Self-pay | Admitting: Medical

## 2015-10-05 ENCOUNTER — Encounter: Payer: PRIVATE HEALTH INSURANCE | Admitting: Obstetrics & Gynecology

## 2015-10-05 MED ORDER — LISINOPRIL 20 MG PO TABS: ORAL_TABLET | ORAL | 1 refills | Status: DC

## 2015-10-09 ENCOUNTER — Telehealth: Payer: Self-pay | Admitting: Medical

## 2015-10-09 NOTE — Telephone Encounter (Signed)
Patient is requesting a refill of lisinopril (PRINIVIL,ZESTRIL) 20 MG tablet   Patient Relation: Self Patient Phone: [phone] Pharmacy:  [facility], [place]

## 2015-10-12 ENCOUNTER — Other Ambulatory Visit: Payer: Self-pay

## 2015-10-12 MED ORDER — LISINOPRIL 20 MG PO TABS: ORAL_TABLET | ORAL | 1 refills | Status: DC

## 2015-10-12 NOTE — Telephone Encounter (Signed)
Patient checking on the status of message below,patient requesting office to call pharmacy directly.

## 2015-10-12 NOTE — Telephone Encounter (Signed)
Rfilled medication.

## 2015-10-19 ENCOUNTER — Encounter: Payer: PRIVATE HEALTH INSURANCE | Admitting: Obstetrics & Gynecology

## 2015-10-21 ENCOUNTER — Encounter: Payer: Self-pay | Admitting: Obstetrics & Gynecology

## 2015-10-21 ENCOUNTER — Ambulatory Visit (INDEPENDENT_AMBULATORY_CARE_PROVIDER_SITE_OTHER): Payer: PRIVATE HEALTH INSURANCE | Admitting: Obstetrics & Gynecology

## 2015-10-21 VITALS — BP 151/81 | HR 92 | Ht 71.0 in | Wt 194.0 lb

## 2015-10-21 DIAGNOSIS — R87613 High grade squamous intraepithelial lesion on cytologic smear of cervix (HGSIL): Secondary | ICD-10-CM

## 2015-10-21 DIAGNOSIS — Z3202 Encounter for pregnancy test, result negative: Secondary | ICD-10-CM

## 2015-10-21 LAB — POCT URINE PREGNANCY: Preg Test, Ur: NEGATIVE

## 2015-10-21 NOTE — Patient Instructions (Signed)
Loop Electrosurgical Excision Procedure, Care After Refer to this sheet in the next few weeks. These instructions provide you with information on caring for yourself after your procedure. Your caregiver may also give you more specific instructions. Your treatment has been planned according to current medical practices, but problems sometimes occur. Call your caregiver if you have any problems or questions after your procedure. HOME CARE INSTRUCTIONS   Do not use tampons, douche, or have sexual intercourse for 2 weeks or as directed by your caregiver.  Begin normal activities if you have no or minimal cramping or bleeding, unless directed otherwise by your caregiver.  Take your temperature if you feel sick. Write down your temperature on paper, and tell your caregiver if you have a fever.  Take all medicines as directed by your caregiver.  Keep all your follow-up appointments and Pap tests as directed by your caregiver. SEEK IMMEDIATE MEDICAL CARE IF:   You have bleeding that is heavier or longer than a normal menstrual cycle.  You have bleeding that is bright red.  You have blood clots.  You have a fever.  You have increasing cramps or pain not relieved by medicine.  You develop abdominal pain that does not seem to be related to the same area of earlier cramping and pain.  You are lightheaded, unusually weak, or faint.  You develop painful or bloody urination.  You develop a bad smelling vaginal discharge. MAKE SURE YOU:  Understand these instructions.  Will watch your condition.  Will get help right away if you are not doing well or get worse.   This information is not intended to replace advice given to you by your health care provider. Make sure you discuss any questions you have with your health care provider.   Document Released: 09/09/2010 Document Revised: 01/17/2014 Document Reviewed: 09/09/2010 Elsevier Interactive Patient Education [employer].

## 2015-10-21 NOTE — Progress Notes (Signed)
Pap smear and colposcopy reviewed.   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 Willeen Niece MD Pathologist, Electronic Signature (Case signed 02/04/2015) Source CervicoVaginal Pap [ThinPrep Imaged] Molecular Results Test Result Chlamydia T. Negative Neisseria G. Negative Trichomonas Vaginalis POSITIVE Comment Dr. 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 Colpo Biopsy/ECC8/24/2017:  Diagnosis 1. Cervix, biopsy, 4 and 7 o'clock and Endocervical curettings - AT LEAST HIGH GRADE SQUAMOUS INTRAEPITHELIAL LESION, CIN-III (SEVERE DYSPLASIA/CIS). - HIGH GRADE SQUAMOUS INTRAEPITHELIAL LESION, CIN-III (SEVERE DYSPLASIA/CIS) INVOLVES UNDERLYING ENDOCERVICAL GLANDS. - SEE COMMENT. 2. Endocervix, curettage, ECC - DETACHED FRAGMENTS OF HIGH GRADE SQUAMOUS INTRAEPITHELIAL LESION, CIN-III (SEVERE DYSPLASIA/CIS). - DETACHED BENIGN ENDOCERVICAL MUCOSAL FRAGMENTS  Risks, benefits, alternatives, and limitations of procedure explained to patient, including pain, bleeding, infection, failure to remove abnormal tissue and failure to cure dysplasia, need for repeat procedures, damage to pelvic organs, cervical incompetence.  Role of HPV,cervical dysplasia and need for close followup was empasized. Informed written consent was obtained. All questions were answered. Time out performed.  Procedure: The patient was placed in lithotomy position and the bivalved coated speculum was placed in the patient's vagina. A grounding pad placed on the patient. Local anesthesia was administered via an intracervical block using 10cc of 2% Lidocaine with epinephrine. The suction was turned on and the Large loop was used to remove the entire transformation zone including the endocervical canal on 80 Watts of cutting current was used to excise the area of decreased uptake and excise the entire  transformation zone. Excellent hemostasis was achieved using roller ball coagulation set at 60 Watts coagulation current. Monsel's solution was then applied and excellent hemostasis was noted.  The speculum was removed from the vagina. Specimens were sent to pathology.  The patient tolerated the procedure well. Post-operative instructions given to patient, including instruction to seek medical attention for persistent bright red bleeding, fever, abdominal/pelvic pain, dysuria, nausea or vomiting. She was also told about the possibility of having copious yellow to black tinged discharge. She was counseled to avoid anything in the vagina (sex/douching/tampons) for 4 weeks. She has a  4 week post-operative check to review results and assess wound healing. Follow up in 6 months for repeat pap or as needed.  Terri Morris, M.D., Cherlynn June

## 2015-10-21 NOTE — Addendum Note (Signed)
Addended by: Asencion Islam on: 10/21/2015 05:10 PM   Modules accepted: Orders

## 2015-10-22 ENCOUNTER — Encounter: Payer: Self-pay | Admitting: *Deleted

## 2015-10-26 ENCOUNTER — Encounter: Payer: Self-pay | Admitting: Obstetrics & Gynecology

## 2015-10-27 ENCOUNTER — Telehealth: Payer: Self-pay

## 2015-10-27 NOTE — Telephone Encounter (Signed)
Patient called complaining of strong vaginal odor. Patient states that it started yesterday.  Patient had LEEP procedure last week. Patient states she has had a slight fever (99) since yesterday. Patient schedule to come in for evaluation with Dr. Ihor Dow. Kathrene Alu RNBSN

## 2015-10-28 ENCOUNTER — Ambulatory Visit: Payer: PRIVATE HEALTH INSURANCE | Admitting: Obstetrics & Gynecology

## 2015-10-28 ENCOUNTER — Encounter: Payer: Self-pay | Admitting: Obstetrics & Gynecology

## 2015-10-28 VITALS — BP 134/87 | Temp 98.7°F | Ht 71.0 in

## 2015-10-28 DIAGNOSIS — N760 Acute vaginitis: Principal | ICD-10-CM

## 2015-10-28 DIAGNOSIS — B9689 Other specified bacterial agents as the cause of diseases classified elsewhere: Secondary | ICD-10-CM

## 2015-10-28 MED ORDER — METRONIDAZOLE 500 MG PO TABS: 500.0000 mg | ORAL_TABLET | Freq: Two times a day (BID) | ORAL | 0 refills | Status: DC

## 2015-10-28 MED ORDER — AMOXICILLIN-POT CLAVULANATE 875-125 MG PO TABS: 1.0000 | ORAL_TABLET | Freq: Two times a day (BID) | ORAL | 1 refills | Status: DC

## 2015-10-28 NOTE — Patient Instructions (Signed)
Pelvic Inflammatory Disease Pelvic inflammatory disease (PID) refers to an infection in some or all of the female organs. The infection can be in the uterus, ovaries, fallopian tubes, or the surrounding tissues in the pelvis. PID can cause abdominal or pelvic pain that comes on suddenly (acute pelvic pain). PID is a serious infection because it can lead to lasting (chronic) pelvic pain or the inability to have children (infertility). CAUSES This condition is most often caused by an infection that is spread during sexual contact. However, the infection can also be caused by the normal bacteria that are found in the vaginal tissues if these bacteria travel upward into the reproductive organs. PID can also occur following:  The birth of a baby.  A miscarriage.  An abortion.  Major pelvic surgery.  The use of an intrauterine device (IUD).  A sexual assault. RISK FACTORS This condition is more likely to develop in women who:  Are younger than 42 years of age.  Are sexually active at [facility] age.  Use nonbarrier contraception.  Have multiple sexual partners.  Have sex with someone who has symptoms of an STD (sexually transmitted disease).  Use oral contraception. At times, certain behaviors can also increase the possibility of getting PID, such as:  Using a vaginal douche.  Having an IUD in place. SYMPTOMS Symptoms of this condition include:  Abdominal or pelvic pain.  Fever.  Chills.  Abnormal vaginal discharge.  Abnormal uterine bleeding.  Unusual pain shortly after the end of a menstrual period.  Painful urination.  Pain with sexual intercourse.  Nausea and vomiting. DIAGNOSIS To diagnose this condition, your health care provider will do a physical exam and take your medical history. A pelvic exam typically reveals great tenderness in the uterus and the surrounding pelvic tissues. You may also have tests, such as:  Lab tests, including a pregnancy test, blood  tests, and urine test.  Culture tests of the vagina and cervix to check for an STD.  Ultrasound.  A laparoscopic procedure to look inside the pelvis.  Examining vaginal secretions under a microscope. TREATMENT Treatment for this condition may involve one or more approaches.  Antibiotic medicines may be prescribed to be taken by mouth.  Sexual partners may need to be treated if the infection is caused by an STD.  For more severe cases, hospitalization may be needed to give antibiotics directly into a vein through an IV tube.  Surgery may be needed if other treatments do not help, but this is rare. It may take weeks until you are completely well. If you are diagnosed with PID, you should also be checked for human immunodeficiency virus (HIV). Your health care provider may test you for infection again 3 months after treatment. You should not have unprotected sex. HOME CARE INSTRUCTIONS  Take over-the-counter and prescription medicines only as told by your health care provider.  If you were prescribed an antibiotic medicine, take it as told by your health care provider. Do not stop taking the antibiotic even if you start to feel better.  Do not have sexual intercourse until treatment is completed or as told by your health care provider. If PID is confirmed, your recent sexual partners will need treatment, especially if you had unprotected sex.  Keep all follow-up visits as told by your health care provider. This is important. SEEK MEDICAL CARE IF:  You have increased or abnormal vaginal discharge.  Your pain does not improve.  You vomit.  You have a fever.  You  cannot tolerate your medicines.  Your partner has an STD.  You have pain when you urinate. SEEK IMMEDIATE MEDICAL CARE IF:  You have increased abdominal or pelvic pain.  You have chills.  Your symptoms are not better in 72 hours even with treatment.   This information is not intended to replace advice given to  you by your health care provider. Make sure you discuss any questions you have with your health care provider.   Document Released: 12/27/2004 Document Revised: 09/17/2014 Document Reviewed: 02/03/2014 Elsevier Interactive Patient Education [employer].

## 2015-10-28 NOTE — Progress Notes (Signed)
History:  40 y.o. [id number] here today for eval of vaginal odor and fever post LEEP. Pt is 1 week s/p LEEP.  She reports feeling pporly at work and she took her temp.  She reports a Tmax of 100.0  She also c/o a foul smelling discharge with black 'soot'  She denies pain.   The following portions of the patient's history were reviewed and updated as appropriate: allergies, current medications, past family history, past medical history, past social history, past surgical history and problem list.  Review of Systems:  Pertinent items are noted in HPI.  Objective:  Physical Exam Blood pressure 134/87, temperature 98.7 F (37.1 C), temperature source Oral, height 5\' 11"  (1.803 m), last menstrual period 10/15/2015. Gen: NAD Abd: Soft, nontender and nondistended Pelvic: Normal appearing external genitalia; normal appearing vaginal mucosa. The cervix contain an eschar.  There is no CMT. No abnormal discharge noted but, there is a very foul odor noted.   Labs and Imaging No results found.  Assessment & Plan:  BV post LEEP  Flagyl 500mg  po bid x 7 days Augmentin 875 mg po bid x 7 days  F/u in 3 weeks as scheduled or sooner prn Reviewed surg path Pt will need a repeat PA in 4 months   Terri Morris L. Harraway-Smith, M.D., Cherlynn June

## 2015-11-10 ENCOUNTER — Other Ambulatory Visit: Payer: Self-pay | Admitting: Medical

## 2015-11-16 ENCOUNTER — Ambulatory Visit: Payer: PRIVATE HEALTH INSURANCE | Admitting: Obstetrics & Gynecology

## 2015-11-16 ENCOUNTER — Encounter: Payer: Self-pay | Admitting: Obstetrics & Gynecology

## 2015-11-16 VITALS — BP 155/78 | HR 102 | Ht 71.0 in | Wt 195.0 lb

## 2015-11-16 DIAGNOSIS — D069 Carcinoma in situ of cervix, unspecified: Secondary | ICD-10-CM

## 2015-11-16 NOTE — Patient Instructions (Signed)
Loop Electrosurgical Excision Procedure, Care After Refer to this sheet in the next few weeks. These instructions provide you with information on caring for yourself after your procedure. Your caregiver may also give you more specific instructions. Your treatment has been planned according to current medical practices, but problems sometimes occur. Call your caregiver if you have any problems or questions after your procedure. HOME CARE INSTRUCTIONS   Do not use tampons, douche, or have sexual intercourse for 2 weeks or as directed by your caregiver.  Begin normal activities if you have no or minimal cramping or bleeding, unless directed otherwise by your caregiver.  Take your temperature if you feel sick. Write down your temperature on paper, and tell your caregiver if you have a fever.  Take all medicines as directed by your caregiver.  Keep all your follow-up appointments and Pap tests as directed by your caregiver. SEEK IMMEDIATE MEDICAL CARE IF:   You have bleeding that is heavier or longer than a normal menstrual cycle.  You have bleeding that is bright red.  You have blood clots.  You have a fever.  You have increasing cramps or pain not relieved by medicine.  You develop abdominal pain that does not seem to be related to the same area of earlier cramping and pain.  You are lightheaded, unusually weak, or faint.  You develop painful or bloody urination.  You develop a bad smelling vaginal discharge. MAKE SURE YOU:  Understand these instructions.  Will watch your condition.  Will get help right away if you are not doing well or get worse.   This information is not intended to replace advice given to you by your health care provider. Make sure you discuss any questions you have with your health care provider.   Document Released: 09/09/2010 Document Revised: 01/17/2014 Document Reviewed: 09/09/2010 Elsevier Interactive Patient Education [employer].

## 2015-11-16 NOTE — Progress Notes (Signed)
History:  40 y.o. 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  Pt is s/p LEEP that was complicated with post procedure infections. Pt reports that her sx resolved after 1 days of atbx.    The following portions of the patient's history were reviewed and updated as appropriate: allergies, current medications, past family history, past medical history, past social history, past surgical history and problem list.  Review of Systems:  Pertinent items are noted in HPI.   Objective:  Physical Exam Blood pressure (!) 155/78, pulse (!) 102, height 5\' 11"  (1.803 m), weight 195 lb (88.5 kg), last menstrual period 10/15/2015. Gen: NAD Abd: Soft, nontender and nondistended Pelvic: Normal appearing external genitalia; normal appearing vaginal mucosa and cervix.  Normal discharge.  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 - HIGH GRADE SQUAMOUS INTRAEPITHELIAL LESION EXTENDS TO THE EDGES OF SEVERAL FRAGMENTS, SEE COMMENT.  Assessment & Plan:  Post procedure check for HGSIL/CINIII. Pt with positive margins. I have reviewed with pt hte option of observation with repeat PAP in 6 months vs repeat LEEP vs hyst.  It is likely that with the cautery used the margins will regress.     rec repeat PAP in 6 months or sooner prn.  I have discussed with pt that if the CINIII persists further tx will be necessary.  I have also counseled pt about smoking cessation as there is an increased risk of cervical ca in pts who smoke.   Total face-to-face time with patient was 15 min.  Greater than 50% was spent in counseling and coordination of care with the patient.  Gurdeep Keesey L. Harraway-Smith, M.D., Cherlynn June

## 2015-12-16 ENCOUNTER — Ambulatory Visit (HOSPITAL_BASED_OUTPATIENT_CLINIC_OR_DEPARTMENT_OTHER): Payer: PRIVATE HEALTH INSURANCE | Admitting: Hematology & Oncology

## 2015-12-16 ENCOUNTER — Other Ambulatory Visit (HOSPITAL_BASED_OUTPATIENT_CLINIC_OR_DEPARTMENT_OTHER): Payer: PRIVATE HEALTH INSURANCE

## 2015-12-16 ENCOUNTER — Encounter: Payer: Self-pay | Admitting: Hematology & Oncology

## 2015-12-16 VITALS — BP 143/75 | HR 101 | Temp 98.1°F | Resp 20 | Wt 201.0 lb

## 2015-12-16 DIAGNOSIS — Z7901 Long term (current) use of anticoagulants: Secondary | ICD-10-CM

## 2015-12-16 DIAGNOSIS — Z86718 Personal history of other venous thrombosis and embolism: Secondary | ICD-10-CM

## 2015-12-16 DIAGNOSIS — I82622 Acute embolism and thrombosis of deep veins of left upper extremity: Secondary | ICD-10-CM

## 2015-12-16 DIAGNOSIS — I82612 Acute embolism and thrombosis of superficial veins of left upper extremity: Secondary | ICD-10-CM

## 2015-12-16 LAB — CBC WITH DIFFERENTIAL (CANCER CENTER ONLY)
BASO#: 0 10*3/uL (ref 0.0–0.2)
BASO%: 0.2 % (ref 0.0–2.0)
EOS%: 2.1 % (ref 0.0–7.0)
Eosinophils Absolute: 0.2 10*3/uL (ref 0.0–0.5)
HCT: 25.6 % — ABNORMAL LOW (ref 34.8–46.6)
HEMOGLOBIN: 7.9 g/dL — AB (ref 11.6–15.9)
LYMPH#: 2.7 10*3/uL (ref 0.9–3.3)
LYMPH%: 28.4 % (ref 14.0–48.0)
MCH: 25 pg — ABNORMAL LOW (ref 26.0–34.0)
MCHC: 30.9 g/dL — AB (ref 32.0–36.0)
MCV: 81 fL (ref 81–101)
MONO#: 0.8 10*3/uL (ref 0.1–0.9)
MONO%: 8.1 % (ref 0.0–13.0)
NEUT%: 61.2 % (ref 39.6–80.0)
NEUTROS ABS: 5.9 10*3/uL (ref 1.5–6.5)
Platelets: 462 10*3/uL — ABNORMAL HIGH (ref 145–400)
RBC: 3.16 10*6/uL — AB (ref 3.70–5.32)
RDW: 20.3 % — ABNORMAL HIGH (ref 11.1–15.7)
WBC: 9.6 10*3/uL (ref 3.9–10.0)

## 2015-12-16 NOTE — Progress Notes (Signed)
Hematology and Oncology Follow Up Visit  Terri Morris [id number] [birth year] 40 y.o. 12/16/2015   Principle Diagnosis:    thrombus of the LEFT basilic vein - (+) lupus anticoagulant  Current Therapy:    Xarelto 20 mg po q day - complete 6 months in 12/2015  EC ASA 162 mg po Q day     Interim History:  Terri Morris is back for follow-up. She is doing quite well. She is working at a assisted living facility. She is quite busy.  She has been on the Xarelto for 6 months. I think we can probably get her off this now. She does have the left basilic vein thrombus. I think baby aspirin would be appropriate. We last saw her back in September, she did have a positive lupus anticoagulant. As such, I think that aspirin would be appropriate for her.  She's had no arm pain. She's had no cough or shortness of breath. She's had no leg swelling.   Her monthly cycles are not too heavy.   She had a nice Thanksgiving. She is getting ready for Christmas. It sounds like she will be busy but able to stay home.   Overall, her performance status is ECOG 0.   Medications:  Current Outpatient Prescriptions:  .  amLODipine (NORVASC) 5 MG tablet, Take 1 tablet (5 mg total) by mouth daily., Disp: 30 tablet, Rfl: 3 .  Choline Fenofibrate (FENOFIBRIC ACID) 45 MG CPDR, TAKE 1 CAPSULE(45 MG) BY MOUTH DAILY, Disp: 30 capsule, Rfl: 0 .  lisinopril (PRINIVIL,ZESTRIL) 20 MG tablet, TAKE 1 TABLET(20 MG) BY MOUTH DAILY, Disp: 30 tablet, Rfl: 1 .  rivaroxaban (XARELTO) 20 MG TABS tablet, Take 1 tablet (20 mg total) by mouth daily with supper., Disp: 30 tablet, Rfl: 6  Allergies: No Known Allergies  Past Medical History, Surgical history, Social history, and Family History were reviewed and updated.  Review of Systems: As above  Physical Exam:  weight is 201 lb (91.2 kg). Her oral temperature is 98.1 F (36.7 C). Her blood pressure is 143/75 (abnormal) and her pulse is 101 (abnormal). Her respiration is 20.    Wt Readings from Last 3 Encounters:  12/16/15 201 lb (91.2 kg)  11/16/15 195 lb (88.5 kg)  10/21/15 194 lb (88 kg)     Well-developed and well-nourished African American female in no obvious distress. Head and neck exam shows no ocular or oral lesions. There are no palpable cervical or supraclavicular lymph nodes. Lungs are clear bilaterally. Cardiac exam regular rate and rhythm with no murmurs, rubs or bruits. Abdomen is soft. She has good bowel sounds. There is no fluid wave. There is no palpable liver or spleen tip. Externally shows no clubbing, cyanosis or edema. There is no swelling in the left arm. No obvious venous cord is noted in the left arm. Neurological exam shows no focal neurological deficits.  Lab Results  Component Value Date   WBC 9.6 12/16/2015   HGB 7.9 (L) 12/16/2015   HCT 25.6 (L) 12/16/2015   MCV 81 12/16/2015   PLT 462 (H) 12/16/2015     Chemistry      Component Value Date/Time   NA 137 07/06/2015 1044   K 4.0 07/06/2015 1044   CL 104 07/06/2015 1044   CO2 25 07/06/2015 1044   BUN 5 (L) 07/06/2015 1044   CREATININE 0.84 07/06/2015 1044      Component Value Date/Time   CALCIUM 9.7 07/06/2015 1044   ALKPHOS 42 07/06/2015 1044   AST  33 07/06/2015 1044   ALT 29 07/06/2015 1044   BILITOT 0.7 07/06/2015 1044         Impression and Plan: Terri Morris is  A 40 year old African American female. She had a thrombus in the left arm. The ultrasound That she had done in September does not show the thrombus. Ultimately, this is resolved.  Again, she's been on Xarelto for 6 months. I think we can get her off the Xarelto and just have her on baby aspirin. I will have her on 2 baby aspirin a day. She does have the lupus anticoagulant.  I don't think we have to get her back to the office. I just don't think that we would be helping her out much in the future. She knows that she can always come back to see [place] or call us if she has any problems. We will will be more  than happy to see her.   Volanda Napoleon, MD 12/6/20174:23 PM

## 2015-12-18 LAB — LUPUS ANTICOAGULANT PANEL
DRVVT CONFIRM: 1.4 ratio — AB (ref 0.8–1.2)
DRVVT MIX: 58.4 s — AB (ref 0.0–47.0)
DRVVT: 81.9 s — AB (ref 0.0–47.0)
PTT-LA: 36.8 s (ref 0.0–51.9)

## 2015-12-23 ENCOUNTER — Other Ambulatory Visit: Payer: Self-pay | Admitting: Medical

## 2015-12-24 MED ORDER — FENOFIBRIC ACID 45 MG PO CPDR: DELAYED_RELEASE_CAPSULE | ORAL | 3 refills | Status: DC

## 2015-12-24 NOTE — Telephone Encounter (Signed)
Per you request, here is the encounter for the above mentioned Patient. At the end of her AVS, it states that she is to come back in 2 years (07/05/2017). Being that this is an erroneous entry, that maybe you'll be able to correct it.

## 2015-12-24 NOTE — Telephone Encounter (Signed)
I did refill pt fenofibrate. Looks like she need visit for bp check and to check her cholesterol. Schedule within next 2 months. Advise to get early appointment so can check fasting lipid panel.

## 2015-12-31 ENCOUNTER — Ambulatory Visit (HOSPITAL_BASED_OUTPATIENT_CLINIC_OR_DEPARTMENT_OTHER): Payer: PRIVATE HEALTH INSURANCE

## 2015-12-31 ENCOUNTER — Other Ambulatory Visit: Payer: Self-pay | Admitting: Family

## 2015-12-31 DIAGNOSIS — D649 Anemia, unspecified: Secondary | ICD-10-CM

## 2015-12-31 DIAGNOSIS — Z86718 Personal history of other venous thrombosis and embolism: Secondary | ICD-10-CM | POA: Diagnosis not present

## 2015-12-31 LAB — CBC WITH DIFFERENTIAL (CANCER CENTER ONLY)
BASO#: 0 10*3/uL (ref 0.0–0.2)
BASO%: 0.2 % (ref 0.0–2.0)
EOS%: 1.8 % (ref 0.0–7.0)
Eosinophils Absolute: 0.2 10*3/uL (ref 0.0–0.5)
HCT: 26.4 % — ABNORMAL LOW (ref 34.8–46.6)
HGB: 8 g/dL — ABNORMAL LOW (ref 11.6–15.9)
LYMPH#: 2.7 10*3/uL (ref 0.9–3.3)
LYMPH%: 30.9 % (ref 14.0–48.0)
MCH: 24.4 pg — ABNORMAL LOW (ref 26.0–34.0)
MCHC: 30.3 g/dL — ABNORMAL LOW (ref 32.0–36.0)
MCV: 81 fL (ref 81–101)
MONO#: 1 10*3/uL — AB (ref 0.1–0.9)
MONO%: 11.4 % (ref 0.0–13.0)
NEUT#: 4.9 10*3/uL (ref 1.5–6.5)
NEUT%: 55.7 % (ref 39.6–80.0)
PLATELETS: 348 10*3/uL (ref 145–400)
RBC: 3.28 10*6/uL — ABNORMAL LOW (ref 3.70–5.32)
RDW: 20 % — AB (ref 11.1–15.7)
WBC: 8.8 10*3/uL (ref 3.9–10.0)

## 2016-01-12 ENCOUNTER — Ambulatory Visit (INDEPENDENT_AMBULATORY_CARE_PROVIDER_SITE_OTHER): Payer: PRIVATE HEALTH INSURANCE | Admitting: Medical

## 2016-01-12 ENCOUNTER — Encounter: Payer: Self-pay | Admitting: Medical

## 2016-01-12 VITALS — BP 146/88 | HR 89 | Temp 98.1°F | Resp 16 | Ht 71.0 in | Wt 204.4 lb

## 2016-01-12 DIAGNOSIS — D649 Anemia, unspecified: Secondary | ICD-10-CM | POA: Diagnosis not present

## 2016-01-12 DIAGNOSIS — E785 Hyperlipidemia, unspecified: Secondary | ICD-10-CM

## 2016-01-12 DIAGNOSIS — I10 Essential (primary) hypertension: Secondary | ICD-10-CM

## 2016-01-12 LAB — CBC WITH DIFFERENTIAL/PLATELET
BASOS PCT: 0.3 % (ref 0.0–3.0)
Basophils Absolute: 0 10*3/uL (ref 0.0–0.1)
EOS PCT: 2.1 % (ref 0.0–5.0)
Eosinophils Absolute: 0.2 10*3/uL (ref 0.0–0.7)
HCT: 25.8 % — ABNORMAL LOW (ref 36.0–46.0)
Hemoglobin: 8.3 g/dL — ABNORMAL LOW (ref 12.0–15.0)
LYMPHS ABS: 1.9 10*3/uL (ref 0.7–4.0)
Lymphocytes Relative: 22.4 % (ref 12.0–46.0)
MCHC: 32.1 g/dL (ref 30.0–36.0)
MCV: 75.3 fl — ABNORMAL LOW (ref 78.0–100.0)
MONO ABS: 0.6 10*3/uL (ref 0.1–1.0)
Monocytes Relative: 7.2 % (ref 3.0–12.0)
NEUTROS PCT: 68 % (ref 43.0–77.0)
Neutro Abs: 5.6 10*3/uL (ref 1.4–7.7)
PLATELETS: 547 10*3/uL — AB (ref 150.0–400.0)
RBC: 3.43 Mil/uL — ABNORMAL LOW (ref 3.87–5.11)
RDW: 21 % — AB (ref 11.5–15.5)
WBC: 8.3 10*3/uL (ref 4.0–10.5)

## 2016-01-12 LAB — FERRITIN: FERRITIN: 12.6 ng/mL (ref 10.0–291.0)

## 2016-01-12 LAB — LIPID PANEL
Cholesterol: 144 mg/dL (ref 0–200)
HDL: 36.1 mg/dL — ABNORMAL LOW (ref >39.00)
LDL Cholesterol: 75 mg/dL (ref 0–99)
NONHDL: 108.09
Total CHOL/HDL Ratio: 4
Triglycerides: 163 mg/dL — ABNORMAL HIGH (ref 0.0–149.0)
VLDL: 32.6 mg/dL (ref 0.0–40.0)

## 2016-01-12 LAB — VITAMIN B12: VITAMIN B 12: 271 pg/mL (ref 211–911)

## 2016-01-12 MED ORDER — AMLODIPINE BESYLATE 5 MG PO TABS: 5.0000 mg | ORAL_TABLET | Freq: Every day | ORAL | 1 refills | Status: DC

## 2016-01-12 MED ORDER — LISINOPRIL 20 MG PO TABS: ORAL_TABLET | ORAL | 1 refills | Status: DC

## 2016-01-12 NOTE — Patient Instructions (Addendum)
Your bp is little high today but you have not been on your norvasc. So refilling the norvasc(restart). Low salt diet and exercise.  For your anemia will get cbc, and anemia panel. Please do ifob as well.  For high lipids will get lipid panel and cmp. Low cholesterol diet.  Follow up in 3-6 months or as needed(will depend on lab review)

## 2016-01-12 NOTE — Progress Notes (Signed)
Subjective:    Patient ID: Terri Morris, female    DOB: 12-20-1975, 41 y.o.   MRN: [id number]  HPI  Pt in for bp check. Pt has not taken her norvasc for about a month. Pt bp level is about this level as today reading  without the medication. No cardiac or neurologic signs or symptoms.   No cardiac or neurologic signs or symptoms. Pt is supposed to be on lisinopril and norvasc.  Pt has anemia. Dr. Marin Olp ordered test. No one every called pt. Pt states menstrual cycles were heavy while pt was on xarelto. But recent one in December was not heavy. No black or blood stools. Pt had anemia in past and was on iron 20 years ago. That time anemic was related to ectopic pregnancy and rupture. Pt describes surgery on both tubes and improssible to get pregnant per her report. Pt stools are normal color. No blood in stool. No black stool.      Review of Systems  Constitutional: Positive for fatigue. Negative for chills and fever.  Respiratory: Negative for cough, chest tightness, shortness of breath and wheezing.   Gastrointestinal: Negative for abdominal pain.  Musculoskeletal: Negative for back pain.  Skin: Negative for rash.  Neurological: Negative for dizziness, seizures, syncope, weakness, light-headedness, numbness and headaches.  Hematological: Negative for adenopathy. Does not bruise/bleed easily.  Psychiatric/Behavioral: Negative for behavioral problems and confusion.     Past Medical History:  Diagnosis Date  . Anxiety   . Hypertension   . Thyroid disease   . Vaginal Pap smear, abnormal      Social History   Social History  . Marital status: Married    Spouse name: N/A  . Number of children: N/A  . Years of education: N/A   Occupational History  . Not on file.   Social History Main Topics  . Smoking status: Heavy Tobacco Smoker    Packs/day: 1.00    Types: Cigarettes  . Smokeless tobacco: Never Used  . Alcohol use 0.0 oz/week     Comment: 3 glasses of wine  on weekends  . Drug use: No  . Sexual activity: Yes    Birth control/ protection: None   Other Topics Concern  . Not on file   Social History Narrative  . No narrative on file    Past Surgical History:  Procedure Laterality Date  . ECTOPIC PREGNANCY SURGERY      Family History  Problem Relation Age of Onset  . Hypertension Mother   . Stroke Mother   . Arthritis Mother   . Alcohol abuse Mother   . Diabetes Mother   . Hypertension Father   . Alcohol abuse Father   . Diabetes Father     No Known Allergies  Current Outpatient Prescriptions on File Prior to Visit  Medication Sig Dispense Refill  . Choline Fenofibrate (FENOFIBRIC ACID) 45 MG CPDR TAKE 1 CAPSULE(45 MG) BY MOUTH DAILY 30 capsule 3  . lisinopril (PRINIVIL,ZESTRIL) 20 MG tablet TAKE 1 TABLET(20 MG) BY MOUTH DAILY 30 tablet 1  . amLODipine (NORVASC) 5 MG tablet Take 1 tablet (5 mg total) by mouth daily. (Patient not taking: Reported on 01/12/2016) 30 tablet 3   No current facility-administered medications on file prior to visit.     BP (!) 146/88 (BP Location: Right Arm, Patient Position: Sitting, Cuff Size: Large) Comment: Patient reports not taking Amlodipine x1 Mth; refill pending  Pulse 89   Temp 98.1 F (36.7 C) (Oral)  Resp 16   Ht 5\' 11"  (1.803 m)   Wt 204 lb 7 oz (92.7 kg)   LMP 01/11/2016   SpO2 100%   BMI 28.51 kg/m       Objective:   Physical Exam   General Mental Status- Alert. General Appearance- Not in acute distress.   Skin General: Color- Normal Color. Moisture- Normal Moisture.  Neck Carotid Arteries- Normal color. Moisture- Normal Moisture. No carotid bruits. No JVD.  Chest and Lung Exam Auscultation: Breath Sounds:-Normal.  Cardiovascular Auscultation:Rythm- Regular. Murmurs & Other Heart Sounds:Auscultation of the heart reveals- No Murmurs.  Abdomen Inspection:-Inspeection Normal. Palpation/Percussion:Note:No mass. Palpation and Percussion of the abdomen reveal- Non  Tender, Non Distended + BS, no rebound or guarding.    Neurologic Cranial Nerve exam:- CN III-XII intact(No nystagmus), symmetric smile. Finger to Nose:- Normal/Intact Strength:- 5/5 equal and symmetric strength both upper and lower extremities.     Assessment & Plan:  Your bp is little high today but you have not been on your norvasc. So refilling the norvasc(restart). Low salt diet and exercise.  For your anemia will get cbc, and anemia panel. Please do ifob as well.  For high lipids will get lipid panel and cmp. Low cholesterol diet.  Follow up in 3-6 months or as needed(will depend on lab review)  Margarette Vannatter, Percell Miller, PA-C

## 2016-01-12 NOTE — Progress Notes (Signed)
Pre visit review using our clinic review tool, if applicable. No additional management support is needed unless otherwise documented below in the visit note/SLS  

## 2016-01-13 LAB — IRON AND TIBC
Iron Saturation: 4 % — CL (ref 15–55)
Iron: 20 ug/dL — ABNORMAL LOW (ref 27–159)
Total Iron Binding Capacity: 520 ug/dL — ABNORMAL HIGH (ref 250–450)
UIBC: 500 ug/dL — ABNORMAL HIGH (ref 131–425)

## 2016-01-13 MED ORDER — FERROUS SULFATE 325 (65 FE) MG PO TBEC: 325.0000 mg | DELAYED_RELEASE_TABLET | Freq: Three times a day (TID) | ORAL | 2 refills | Status: DC

## 2016-01-29 ENCOUNTER — Other Ambulatory Visit: Payer: Self-pay | Admitting: Medical

## 2016-03-06 ENCOUNTER — Other Ambulatory Visit: Payer: Self-pay | Admitting: Medical

## 2016-03-29 ENCOUNTER — Other Ambulatory Visit: Payer: Self-pay | Admitting: Medical

## 2016-04-14 ENCOUNTER — Other Ambulatory Visit: Payer: Self-pay | Admitting: Medical

## 2016-04-14 DIAGNOSIS — Z1231 Encounter for screening mammogram for malignant neoplasm of breast: Secondary | ICD-10-CM

## 2016-04-18 ENCOUNTER — Ambulatory Visit (HOSPITAL_BASED_OUTPATIENT_CLINIC_OR_DEPARTMENT_OTHER)
Admission: RE | Admit: 2016-04-18 | Discharge: 2016-04-18 | Disposition: A | Discharge status: Home / Self Care | Payer: PRIVATE HEALTH INSURANCE | Source: Ambulatory Visit | Attending: Medical | Admitting: Medical

## 2016-04-18 ENCOUNTER — Encounter: Payer: Self-pay | Admitting: Medical

## 2016-04-18 DIAGNOSIS — Z1231 Encounter for screening mammogram for malignant neoplasm of breast: Secondary | ICD-10-CM | POA: Diagnosis not present

## 2016-06-10 ENCOUNTER — Ambulatory Visit: Payer: PRIVATE HEALTH INSURANCE | Admitting: Obstetrics & Gynecology

## 2016-06-24 ENCOUNTER — Ambulatory Visit (INDEPENDENT_AMBULATORY_CARE_PROVIDER_SITE_OTHER): Payer: PRIVATE HEALTH INSURANCE | Admitting: Obstetrics & Gynecology

## 2016-06-24 ENCOUNTER — Encounter: Payer: Self-pay | Admitting: Obstetrics & Gynecology

## 2016-06-24 VITALS — BP 149/97 | HR 117 | Ht 71.0 in | Wt 181.0 lb

## 2016-06-24 DIAGNOSIS — Z716 Tobacco abuse counseling: Secondary | ICD-10-CM

## 2016-06-24 DIAGNOSIS — Z9889 Other specified postprocedural states: Secondary | ICD-10-CM

## 2016-06-24 DIAGNOSIS — R87613 High grade squamous intraepithelial lesion on cytologic smear of cervix (HGSIL): Secondary | ICD-10-CM

## 2016-06-24 NOTE — Progress Notes (Signed)
Follow up pap from her LEEP. Kathrene Alu RNBSN

## 2016-06-24 NOTE — Progress Notes (Signed)
History:  41 y.o. [id number] here today for repeat PAP after high grade dysplasia and LEEP. She has no complaints. She has lost weight but, does not feel ready to stop smoking at this time.  The following portions of the patient's history were reviewed and updated as appropriate: allergies, current medications, past family history, past medical history, past social history, past surgical history and problem list.  Review of Systems:  Pertinent items are noted in HPI.   Objective:  Physical Exam Blood pressure (!) 149/97, pulse (!) 117, height 5\' 11"  (1.803 m), weight 181 lb (82.1 kg), last menstrual period 05/16/2016.  CONSTITUTIONAL: Well-developed, well-nourished female in no acute distress.  HENT:  Normocephalic, atraumatic EYES: Conjunctivae and EOM are normal. No scleral icterus.  NECK: Normal range of motion SKIN: Skin is warm and dry. No rash noted. Not diaphoretic.No pallor. Boyd: Alert and oriented to person, place, and time. Normal coordination.  Pelvic: Normal appearing external genitalia; normal appearing vaginal mucosa and cervix.  Normal discharge.    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 - HIGH GRADE SQUAMOUS INTRAEPITHELIAL LESION EXTENDS TO THE EDGES OF SEVERAL FRAGMENTS, SEE COMMENT.  Assessment & Plan:  High grade dysplasia s/p LEEP in )ct 2017 F/u PAP Encouraged weight loss Reviewed tob cessation and offered resources when pt is ready to try to stop  Total face-to-face time with patient was 15 min.  Greater than 50% was spent in counseling and coordination of care with the patient.  Nashay Brickley L. Harraway-Smith, M.D., Cherlynn June

## 2016-06-24 NOTE — Patient Instructions (Signed)
Cervical Dysplasia Cervical dysplasia is a condition in which a woman's cervix cells have abnormal changes. The cervix is the opening of the uterus (womb). It is located between the vagina and the uterus. Cervical dysplasia may be an early sign of cervical cancer. If left untreated, this condition may become more severe and may progress to cervical cancer. Early detection, treatment, and follow-up care are very important. What are the causes? Cervical dysplasia can be caused by a human papillomavirus (HPV) infection. HPV is the most common sexually transmitted infection (STI). HPV is spread from person to person through sexual contact. This includes oral, vaginal, or anal sex. What increases the risk? The following factors may make you more likely to develop this condition:  Having had a sexually transmitted infection (STI), such as herpes, chlamydia or HPV.  Becoming sexually active before age 18.  Having had more than one sexual partner.  Having a sexual partner who has multiple sexual partners.  Not using protection, such as a condom, during sex, especially with new sexual partners.  Having a history of cancer of the vagina or vulva.  Having a weakened body defense (immune) system.  Being the daughter of a woman who took diethylstilbestrol (DES) during pregnancy.  Having a family history of cervical cancer.  Smoking.  Using oral contraceptives, also called birth control pills.  Having had three or more full-term pregnancies.  What are the signs or symptoms? There are usually no symptoms of this condition. If you do have symptoms, they may include:  Abnormal vaginal discharge.  Bleeding between periods or after sex.  Bleeding during menopause.  Pain during sex (dyspareunia).  How is this diagnosed? A test called a Pap test may be done. During this test, cells are taken from the cervix and then examined under a microscope. A test in which tissue is removed from the cervix  (biopsy) may also be done if the Pap test is abnormal or if the cervix looks abnormal. How is this treated? Treatment varies based on the severity of the condition. Treatment may include:  Cryotherapy. During cryotherapy, the abnormal cells are frozen with a steel-tip instrument.  Loop electrosurgical excision procedure (LEEP). This procedure removes abnormal tissue from the cervix.  Surgery to remove abnormal tissue. This is usually done in more advanced cases. Surgical options include: ? A cone biopsy. This is a procedure in which the cervical canal and a portion of the center of the cervix are removed. ? Hysterectomy. This is a surgery in which the uterus and cervix are removed.  Follow these instructions at home:  Take over-the-counter and prescription medicines only as told by your health care provider.  Do not use tampons, have sex, or douche until your health care provider says it is okay.  Keep follow-up visits as told by your health care provider. This is important. Women who have been treated for cervical dysplasia should have regular pelvic exams and Pap tests as told by their health care provider. How is this prevented?  Practice safe sex to help prevent sexually transmitted infections (STI) that may cause this condition.  Have regular Pap tests. Talk with your health care provider about how often you need these tests. Pap tests will help identify cell changes that can lead to cancer. Contact a health care provider if:  You develop genital warts. Get help right away if:  You have a fever.  You have abnormal vaginal discharge.  Your menstrual period is heavier than normal.  You develop bright   red bleeding. This may include blood clots.  You have pain or cramps that get worse, and medicine does not help to relieve your pain.  You feel light-headed and you are unusually weak.  You have fainting spells.  You have pain in the abdomen. Summary  Cervical dysplasia  is a condition in which a woman's cervix cells have abnormal changes.  If left untreated, this condition may become more severe and may progress to cervical cancer.  Early detection, treatment, and follow-up care are very important in managing this condition.  Have regular pelvic exams and Pap tests. Talk with your health care provider about how often you need these tests. Pap tests will help identify cell changes that can lead to cancer. This information is not intended to replace advice given to you by your health care provider. Make sure you discuss any questions you have with your health care provider. Document Released: 12/27/2004 Document Revised: 12/31/2015 Document Reviewed: 12/31/2015 Elsevier Interactive Patient Education  2017 [employer]  

## 2016-06-28 LAB — CYTOLOGY - PAP
Diagnosis: NEGATIVE
HPV (WINDOPATH): NOT DETECTED

## 2016-07-09 ENCOUNTER — Inpatient Hospital Stay (HOSPITAL_COMMUNITY): Payer: PRIVATE HEALTH INSURANCE

## 2016-07-09 ENCOUNTER — Inpatient Hospital Stay (HOSPITAL_BASED_OUTPATIENT_CLINIC_OR_DEPARTMENT_OTHER)
Admission: EM | Admit: 2016-07-09 | Discharge: 2016-07-17 | DRG: 438 | Disposition: A | Discharge status: Home / Self Care | Payer: PRIVATE HEALTH INSURANCE | Attending: Internal Medicine | Admitting: Internal Medicine

## 2016-07-09 ENCOUNTER — Emergency Department (HOSPITAL_BASED_OUTPATIENT_CLINIC_OR_DEPARTMENT_OTHER): Payer: PRIVATE HEALTH INSURANCE

## 2016-07-09 ENCOUNTER — Encounter (HOSPITAL_COMMUNITY): Payer: Self-pay | Admitting: Internal Medicine

## 2016-07-09 DIAGNOSIS — I10 Essential (primary) hypertension: Secondary | ICD-10-CM | POA: Diagnosis not present

## 2016-07-09 DIAGNOSIS — I5031 Acute diastolic (congestive) heart failure: Secondary | ICD-10-CM

## 2016-07-09 DIAGNOSIS — K769 Liver disease, unspecified: Secondary | ICD-10-CM | POA: Diagnosis not present

## 2016-07-09 DIAGNOSIS — Z789 Other specified health status: Secondary | ICD-10-CM | POA: Diagnosis present

## 2016-07-09 DIAGNOSIS — K219 Gastro-esophageal reflux disease without esophagitis: Secondary | ICD-10-CM | POA: Diagnosis present

## 2016-07-09 DIAGNOSIS — E860 Dehydration: Secondary | ICD-10-CM | POA: Diagnosis present

## 2016-07-09 DIAGNOSIS — F17200 Nicotine dependence, unspecified, uncomplicated: Secondary | ICD-10-CM | POA: Diagnosis not present

## 2016-07-09 DIAGNOSIS — E784 Other hyperlipidemia: Secondary | ICD-10-CM | POA: Diagnosis not present

## 2016-07-09 DIAGNOSIS — K7200 Acute and subacute hepatic failure without coma: Secondary | ICD-10-CM | POA: Diagnosis present

## 2016-07-09 DIAGNOSIS — N179 Acute kidney failure, unspecified: Secondary | ICD-10-CM

## 2016-07-09 DIAGNOSIS — E875 Hyperkalemia: Secondary | ICD-10-CM | POA: Diagnosis present

## 2016-07-09 DIAGNOSIS — J9600 Acute respiratory failure, unspecified whether with hypoxia or hypercapnia: Secondary | ICD-10-CM

## 2016-07-09 DIAGNOSIS — E1110 Type 2 diabetes mellitus with ketoacidosis without coma: Secondary | ICD-10-CM | POA: Diagnosis present

## 2016-07-09 DIAGNOSIS — Z7982 Long term (current) use of aspirin: Secondary | ICD-10-CM

## 2016-07-09 DIAGNOSIS — Z888 Allergy status to other drugs, medicaments and biological substances status: Secondary | ICD-10-CM

## 2016-07-09 DIAGNOSIS — F17210 Nicotine dependence, cigarettes, uncomplicated: Secondary | ICD-10-CM | POA: Diagnosis present

## 2016-07-09 DIAGNOSIS — J9601 Acute respiratory failure with hypoxia: Secondary | ICD-10-CM | POA: Diagnosis present

## 2016-07-09 DIAGNOSIS — Z8249 Family history of ischemic heart disease and other diseases of the circulatory system: Secondary | ICD-10-CM

## 2016-07-09 DIAGNOSIS — G92 Toxic encephalopathy: Secondary | ICD-10-CM | POA: Diagnosis present

## 2016-07-09 DIAGNOSIS — D3500 Benign neoplasm of unspecified adrenal gland: Secondary | ICD-10-CM | POA: Diagnosis present

## 2016-07-09 DIAGNOSIS — R160 Hepatomegaly, not elsewhere classified: Secondary | ICD-10-CM

## 2016-07-09 DIAGNOSIS — E1310 Other specified diabetes mellitus with ketoacidosis without coma: Secondary | ICD-10-CM

## 2016-07-09 DIAGNOSIS — E1121 Type 2 diabetes mellitus with diabetic nephropathy: Secondary | ICD-10-CM | POA: Diagnosis not present

## 2016-07-09 DIAGNOSIS — E785 Hyperlipidemia, unspecified: Secondary | ICD-10-CM | POA: Diagnosis present

## 2016-07-09 DIAGNOSIS — Z811 Family history of alcohol abuse and dependence: Secondary | ICD-10-CM | POA: Diagnosis not present

## 2016-07-09 DIAGNOSIS — F1010 Alcohol abuse, uncomplicated: Secondary | ICD-10-CM | POA: Diagnosis present

## 2016-07-09 DIAGNOSIS — K760 Fatty (change of) liver, not elsewhere classified: Secondary | ICD-10-CM | POA: Diagnosis not present

## 2016-07-09 DIAGNOSIS — E872 Acidosis, unspecified: Secondary | ICD-10-CM

## 2016-07-09 DIAGNOSIS — I110 Hypertensive heart disease with heart failure: Secondary | ICD-10-CM | POA: Diagnosis present

## 2016-07-09 DIAGNOSIS — K859 Acute pancreatitis without necrosis or infection, unspecified: Principal | ICD-10-CM

## 2016-07-09 DIAGNOSIS — E876 Hypokalemia: Secondary | ICD-10-CM | POA: Diagnosis not present

## 2016-07-09 DIAGNOSIS — Z7289 Other problems related to lifestyle: Secondary | ICD-10-CM | POA: Diagnosis present

## 2016-07-09 DIAGNOSIS — R945 Abnormal results of liver function studies: Secondary | ICD-10-CM

## 2016-07-09 DIAGNOSIS — IMO0001 Reserved for inherently not codable concepts without codable children: Secondary | ICD-10-CM | POA: Diagnosis present

## 2016-07-09 DIAGNOSIS — R400 Somnolence: Secondary | ICD-10-CM | POA: Diagnosis not present

## 2016-07-09 DIAGNOSIS — Z720 Tobacco use: Secondary | ICD-10-CM | POA: Diagnosis not present

## 2016-07-09 DIAGNOSIS — K852 Alcohol induced acute pancreatitis without necrosis or infection: Secondary | ICD-10-CM | POA: Diagnosis not present

## 2016-07-09 DIAGNOSIS — R0602 Shortness of breath: Secondary | ICD-10-CM

## 2016-07-09 DIAGNOSIS — Z79899 Other long term (current) drug therapy: Secondary | ICD-10-CM | POA: Diagnosis not present

## 2016-07-09 DIAGNOSIS — F1090 Alcohol use, unspecified, uncomplicated: Secondary | ICD-10-CM | POA: Diagnosis present

## 2016-07-09 DIAGNOSIS — I360 Nonrheumatic tricuspid (valve) stenosis: Secondary | ICD-10-CM | POA: Diagnosis not present

## 2016-07-09 DIAGNOSIS — K7689 Other specified diseases of liver: Secondary | ICD-10-CM | POA: Diagnosis not present

## 2016-07-09 HISTORY — DX: Hyperlipidemia, unspecified: E78.5

## 2016-07-09 HISTORY — DX: Acute kidney failure, unspecified: N17.9

## 2016-07-09 LAB — COMPREHENSIVE METABOLIC PANEL
ALK PHOS: 84 U/L (ref 38–126)
ALT: 136 U/L — ABNORMAL HIGH (ref 14–54)
AST: 168 U/L — ABNORMAL HIGH (ref 15–41)
Albumin: 5.6 g/dL — ABNORMAL HIGH (ref 3.5–5.0)
BILIRUBIN TOTAL: 2.4 mg/dL — AB (ref 0.3–1.2)
BUN: 15 mg/dL (ref 6–20)
CALCIUM: 9.9 mg/dL (ref 8.9–10.3)
CO2: <7 mmol/L — ABNORMAL LOW (ref 22–32)
Chloride: 101 mmol/L (ref 101–111)
Creatinine, Ser: 2.96 mg/dL — ABNORMAL HIGH (ref 0.44–1.00)
GFR calc Af Amer: 21 mL/min — ABNORMAL LOW (ref >60)
GFR, EST NON AFRICAN AMERICAN: 18 mL/min — AB (ref >60)
Glucose, Bld: 249 mg/dL — ABNORMAL HIGH (ref 65–99)
POTASSIUM: 5.4 mmol/L — AB (ref 3.5–5.1)
Sodium: 138 mmol/L (ref 135–145)
TOTAL PROTEIN: 10.1 g/dL — AB (ref 6.5–8.1)

## 2016-07-09 LAB — LIPASE, BLOOD: LIPASE: 1875 U/L — AB (ref 11–51)

## 2016-07-09 LAB — URINALYSIS, ROUTINE W REFLEX MICROSCOPIC
GLUCOSE, UA: NEGATIVE mg/dL
Leukocytes, UA: NEGATIVE
Nitrite: NEGATIVE
PH: 5 (ref 5.0–8.0)
Protein, ur: >300 mg/dL — AB
SPECIFIC GRAVITY, URINE: 1.018 (ref 1.005–1.030)

## 2016-07-09 LAB — URINALYSIS, MICROSCOPIC (REFLEX)

## 2016-07-09 LAB — CBC WITH DIFFERENTIAL/PLATELET
Basophils Absolute: 0 10*3/uL (ref 0.0–0.1)
Basophils Relative: 0 %
Eosinophils Absolute: 0 10*3/uL (ref 0.0–0.7)
Eosinophils Relative: 0 %
HEMATOCRIT: 49.5 % — AB (ref 36.0–46.0)
HEMOGLOBIN: 16.9 g/dL — AB (ref 12.0–15.0)
LYMPHS ABS: 1 10*3/uL (ref 0.7–4.0)
LYMPHS PCT: 10 %
MCH: 37.5 pg — AB (ref 26.0–34.0)
MCHC: 34.1 g/dL (ref 30.0–36.0)
MCV: 109.8 fL — AB (ref 78.0–100.0)
MONO ABS: 1 10*3/uL (ref 0.1–1.0)
MONOS PCT: 11 %
NEUTROS ABS: 7.4 10*3/uL (ref 1.7–7.7)
Neutrophils Relative %: 79 %
Platelets: 269 10*3/uL (ref 150–400)
RBC: 4.51 MIL/uL (ref 3.87–5.11)
RDW: 17.8 % — AB (ref 11.5–15.5)
WBC: 9.4 10*3/uL (ref 4.0–10.5)

## 2016-07-09 LAB — I-STAT VENOUS BLOOD GAS, ED
Acid-base deficit: 24 mmol/L — ABNORMAL HIGH (ref 0.0–2.0)
BICARBONATE: 6.5 mmol/L — AB (ref 20.0–28.0)
O2 SAT: 33 %
PCO2 VEN: 26.1 mmHg — AB (ref 44.0–60.0)
PH VEN: 7.003 — AB (ref 7.250–7.430)
TCO2: 7 mmol/L (ref 0–100)
pO2, Ven: 30 mmHg — CL (ref 32.0–45.0)

## 2016-07-09 LAB — GLUCOSE, CAPILLARY
GLUCOSE-CAPILLARY: 198 mg/dL — AB (ref 65–99)
GLUCOSE-CAPILLARY: 240 mg/dL — AB (ref 65–99)
Glucose-Capillary: 164 mg/dL — ABNORMAL HIGH (ref 65–99)
Glucose-Capillary: 183 mg/dL — ABNORMAL HIGH (ref 65–99)

## 2016-07-09 LAB — CBG MONITORING, ED: Glucose-Capillary: 222 mg/dL — ABNORMAL HIGH (ref 65–99)

## 2016-07-09 LAB — I-STAT CG4 LACTIC ACID, ED: Lactic Acid, Venous: 1.89 mmol/L (ref 0.5–1.9)

## 2016-07-09 LAB — BASIC METABOLIC PANEL
Anion gap: 22 — ABNORMAL HIGH (ref 5–15)
BUN: 14 mg/dL (ref 6–20)
CHLORIDE: 110 mmol/L (ref 101–111)
CO2: 7 mmol/L — AB (ref 22–32)
CREATININE: 2.72 mg/dL — AB (ref 0.44–1.00)
Calcium: 8.3 mg/dL — ABNORMAL LOW (ref 8.9–10.3)
GFR calc Af Amer: 24 mL/min — ABNORMAL LOW (ref >60)
GFR calc non Af Amer: 20 mL/min — ABNORMAL LOW (ref >60)
GLUCOSE: 207 mg/dL — AB (ref 65–99)
POTASSIUM: 5.9 mmol/L — AB (ref 3.5–5.1)
Sodium: 139 mmol/L (ref 135–145)

## 2016-07-09 LAB — RAPID URINE DRUG SCREEN, HOSP PERFORMED
AMPHETAMINES: NOT DETECTED
BARBITURATES: NOT DETECTED
BENZODIAZEPINES: NOT DETECTED
COCAINE: NOT DETECTED
OPIATES: POSITIVE — AB
TETRAHYDROCANNABINOL: NOT DETECTED

## 2016-07-09 LAB — ETHANOL: Alcohol, Ethyl (B): <5 mg/dL (ref <5)

## 2016-07-09 LAB — SALICYLATE LEVEL: Salicylate Lvl: <7 mg/dL (ref 2.8–30.0)

## 2016-07-09 LAB — BILIRUBIN, DIRECT: Bilirubin, Direct: 0.6 mg/dL — ABNORMAL HIGH (ref 0.1–0.5)

## 2016-07-09 LAB — TRIGLYCERIDES: TRIGLYCERIDES: 370 mg/dL — AB (ref <150)

## 2016-07-09 LAB — OSMOLALITY: Osmolality: 332 mOsm/kg (ref 275–295)

## 2016-07-09 LAB — HCG, SERUM, QUALITATIVE: Preg, Serum: NEGATIVE

## 2016-07-09 LAB — ACETAMINOPHEN LEVEL: Acetaminophen (Tylenol), Serum: <10 ug/mL — ABNORMAL LOW (ref 10–30)

## 2016-07-09 MED ORDER — ENOXAPARIN SODIUM 30 MG/0.3ML ~~LOC~~ SOLN
30.0000 mg | SUBCUTANEOUS | Status: DC
  Administered 2016-07-09: 30 mg via SUBCUTANEOUS
  Filled 2016-07-09: qty 0.3

## 2016-07-09 MED ORDER — SODIUM CHLORIDE 0.9 % IV SOLN
INTRAVENOUS | Status: DC
  Administered 2016-07-09: 20:00:00 via INTRAVENOUS
  Filled 2016-07-09: qty 1

## 2016-07-09 MED ORDER — SODIUM CHLORIDE 0.9 % IV SOLN
Freq: Once | INTRAVENOUS | Status: AC
  Administered 2016-07-09: 17:00:00 via INTRAVENOUS

## 2016-07-09 MED ORDER — SODIUM CHLORIDE 0.9 % IV SOLN
INTRAVENOUS | Status: DC
  Administered 2016-07-09: 1.6 [IU]/h via INTRAVENOUS
  Filled 2016-07-09 (×2): qty 1

## 2016-07-09 MED ORDER — METOCLOPRAMIDE HCL 5 MG/ML IJ SOLN
10.0000 mg | Freq: Once | INTRAMUSCULAR | Status: AC
  Administered 2016-07-09: 10 mg via INTRAVENOUS
  Filled 2016-07-09: qty 2

## 2016-07-09 MED ORDER — MORPHINE SULFATE (PF) 2 MG/ML IV SOLN
2.0000 mg | INTRAVENOUS | Status: DC | PRN
Start: 2016-07-09 — End: 2016-07-10
  Administered 2016-07-09 – 2016-07-10 (×3): 2 mg via INTRAVENOUS
  Filled 2016-07-09 (×3): qty 1

## 2016-07-09 MED ORDER — MORPHINE SULFATE (PF) 4 MG/ML IV SOLN
4.0000 mg | Freq: Once | INTRAVENOUS | Status: AC
  Administered 2016-07-09: 4 mg via INTRAVENOUS
  Filled 2016-07-09: qty 1

## 2016-07-09 MED ORDER — DEXTROSE-NACL 5-0.45 % IV SOLN
INTRAVENOUS | Status: DC
  Administered 2016-07-09 – 2016-07-10 (×2): 1000 mL via INTRAVENOUS
  Administered 2016-07-10: 06:00:00 via INTRAVENOUS

## 2016-07-09 MED ORDER — AMLODIPINE BESYLATE 5 MG PO TABS
5.0000 mg | ORAL_TABLET | Freq: Every day | ORAL | Status: DC
  Administered 2016-07-10 – 2016-07-13 (×4): 5 mg via ORAL
  Filled 2016-07-09 (×4): qty 1

## 2016-07-09 MED ORDER — SODIUM CHLORIDE 0.9 % IV SOLN: INTRAVENOUS | Status: DC

## 2016-07-09 MED ORDER — ONDANSETRON HCL 4 MG/2ML IJ SOLN
4.0000 mg | Freq: Once | INTRAMUSCULAR | Status: AC
  Administered 2016-07-09: 4 mg via INTRAVENOUS
  Filled 2016-07-09: qty 2

## 2016-07-09 MED ORDER — FOLIC ACID 1 MG PO TABS
1.0000 mg | ORAL_TABLET | Freq: Every day | ORAL | Status: DC
  Administered 2016-07-10 – 2016-07-17 (×8): 1 mg via ORAL
  Filled 2016-07-09 (×8): qty 1

## 2016-07-09 MED ORDER — LORAZEPAM 2 MG/ML IJ SOLN: 1.0000 mg | Freq: Four times a day (QID) | INTRAMUSCULAR | Status: AC | PRN

## 2016-07-09 MED ORDER — LORAZEPAM 1 MG PO TABS
1.0000 mg | ORAL_TABLET | Freq: Four times a day (QID) | ORAL | Status: AC | PRN
  Administered 2016-07-12: 1 mg via ORAL
  Filled 2016-07-09: qty 1

## 2016-07-09 MED ORDER — SODIUM CHLORIDE 0.9 % IV BOLUS (SEPSIS)
1000.0000 mL | Freq: Once | INTRAVENOUS | Status: AC
  Administered 2016-07-09: 1000 mL via INTRAVENOUS

## 2016-07-09 MED ORDER — ASPIRIN EC 81 MG PO TBEC
162.0000 mg | DELAYED_RELEASE_TABLET | Freq: Every day | ORAL | Status: DC
  Administered 2016-07-10 – 2016-07-11 (×2): 162 mg via ORAL
  Filled 2016-07-09 (×2): qty 2

## 2016-07-09 MED ORDER — HYDROMORPHONE HCL 1 MG/ML IJ SOLN
1.0000 mg | Freq: Once | INTRAMUSCULAR | Status: AC
  Administered 2016-07-09: 1 mg via INTRAVENOUS
  Filled 2016-07-09: qty 1

## 2016-07-09 MED ORDER — ZOLPIDEM TARTRATE 5 MG PO TABS
5.0000 mg | ORAL_TABLET | Freq: Every evening | ORAL | Status: DC | PRN
  Administered 2016-07-11 – 2016-07-16 (×6): 5 mg via ORAL
  Filled 2016-07-09 (×6): qty 1

## 2016-07-09 MED ORDER — FENOFIBRIC ACID 45 MG PO CPDR: 45.0000 mg | DELAYED_RELEASE_CAPSULE | Freq: Every day | ORAL | Status: DC

## 2016-07-09 MED ORDER — VITAMIN B-1 100 MG PO TABS
100.0000 mg | ORAL_TABLET | Freq: Every day | ORAL | Status: DC
  Administered 2016-07-10 – 2016-07-17 (×8): 100 mg via ORAL
  Filled 2016-07-09 (×8): qty 1

## 2016-07-09 MED ORDER — THIAMINE HCL 100 MG/ML IJ SOLN
100.0000 mg | Freq: Every day | INTRAMUSCULAR | Status: DC
  Administered 2016-07-09: 100 mg via INTRAVENOUS
  Filled 2016-07-09 (×2): qty 2

## 2016-07-09 MED ORDER — LORAZEPAM 2 MG/ML IJ SOLN: 0.0000 mg | Freq: Two times a day (BID) | INTRAMUSCULAR | Status: DC

## 2016-07-09 MED ORDER — ADULT MULTIVITAMIN W/MINERALS CH
1.0000 | ORAL_TABLET | Freq: Every day | ORAL | Status: DC
  Administered 2016-07-10 – 2016-07-17 (×8): 1 via ORAL
  Filled 2016-07-09 (×8): qty 1

## 2016-07-09 MED ORDER — PANTOPRAZOLE SODIUM 40 MG IV SOLR
40.0000 mg | INTRAVENOUS | Status: DC
  Administered 2016-07-09 – 2016-07-12 (×4): 40 mg via INTRAVENOUS
  Filled 2016-07-09 (×4): qty 40

## 2016-07-09 MED ORDER — HYDRALAZINE HCL 20 MG/ML IJ SOLN: 5.0000 mg | INTRAMUSCULAR | Status: DC | PRN

## 2016-07-09 MED ORDER — LORAZEPAM 2 MG/ML IJ SOLN
0.0000 mg | Freq: Four times a day (QID) | INTRAMUSCULAR | Status: DC
  Administered 2016-07-10: 2 mg via INTRAVENOUS
  Filled 2016-07-09: qty 1

## 2016-07-09 MED ORDER — ONDANSETRON HCL 4 MG/2ML IJ SOLN
4.0000 mg | Freq: Three times a day (TID) | INTRAMUSCULAR | Status: DC | PRN
  Administered 2016-07-09: 4 mg via INTRAVENOUS
  Filled 2016-07-09: qty 2

## 2016-07-09 MED ORDER — NICOTINE 21 MG/24HR TD PT24
21.0000 mg | MEDICATED_PATCH | Freq: Every day | TRANSDERMAL | Status: DC
  Administered 2016-07-09 – 2016-07-17 (×9): 21 mg via TRANSDERMAL
  Filled 2016-07-09 (×9): qty 1

## 2016-07-09 NOTE — ED Notes (Signed)
ED Provider at bedside. 

## 2016-07-09 NOTE — Progress Notes (Signed)
[facility]  Called for Direct admit by Shary Decamp.  41 y/o female with PMH of HCTZ presents for nausea, vomiting and lower abdominal pain for 2-3 days. Has a h/o ectopic pregnancy. Pelvic ultrasound and B HCG negative.  Found to be tachycardic, acidotic, with AKI, hyperkalemia, Lipase 1875,  Glucose 249 CT abd show acute pancreatitis, fatty liver with lesion in right lobe of liver and sludge or stones in gallbladder. Has been ordered 2 L of IVF and a glucose stablizer.  I have requested continuous IVF after fluid boluses, stat Triglyceride level, PRN pain and nausea medications and have accepted her to a SDU bed at [facility].   Debbe Odea, MD

## 2016-07-09 NOTE — ED Notes (Addendum)
Assumed care of patient from [facility]. Pt resting quietly. NO distress. States pain has improved. Awaiting labs. Pt updated.

## 2016-07-09 NOTE — ED Notes (Signed)
Unable to obtain 2nd IV access after several attempts by 2nd RN. Dr. Regenia Skeeter made aware.

## 2016-07-09 NOTE — ED Notes (Signed)
Unsuccessful IV attempt x 2 in Rt forearm and ac.

## 2016-07-09 NOTE — ED Notes (Signed)
Pt unable to void at this time. Pt aware of the need for a urine sample, instructed to push call bell when ready to do so.

## 2016-07-09 NOTE — ED Notes (Signed)
Pt unable to void at this time. 

## 2016-07-09 NOTE — H&P (Addendum)
History and Physical    Terri Morris BSJ:[id number] DOB: 1975/04/09 DOA: 07/09/2016  Referring MD/NP/PA:   PCP: Mackie Pai, PA-C   Patient coming from:  The patient is coming from home.  At baseline, pt is independent for most of ADL.  Chief Complaint: Nausea, vomiting, abdominal pain  HPI: Terri Morris is a 41 y.o. female with medical history significant of hypertension, hyperlipidemia, anxiety, tobacco abuse, alcohol use, who presents with nausea, vomiting, abdominal pain.  Patient states that her abdominal pain started 3 days ago. It is located in the central abdomen, constant, 10 out of 10 in severity, sharp, radiating to the back. It is not aggravated or admitted any factors. It is associated with nausea and vomiting. She vomited 2-4 times each day. No diarrhea. No hematochezia or hematemesis. Denies symptoms of UTI. She has mild shortness of breath on exertion, no cough or chest pain. Denies fever or chills. . No unilateral weakness.Patient states that she drinks beer, approximately 3 beers each day.  ED Course: pt was found to have Lipase 1875, abnormal liver functions with AST 168, ALT 136, total bilirubin 2.4 and ALP 84, potassium of 5.4, TG 370, bicarbonate less than 7, acute renal injury with creatinine 2.96, blood sugar 249, ABG with pH of 7.003, PCO2 24, PO2 30. WBC 9.4, lactate acid 1.89, UDS positive for opiates, negative pregnancy test, Tylenol level less than 10, salicylate level less than 7, negative urinalysis for UTI, but positive for ketone, temperature normal, tachycardia, oxygen saturation 85 earlier, which is 100% on room air now. Patient is admitted to stepdown bed as inpatient.   CT-renal stone study showed: 1. Acute pancreatitis without peripancreatic fluid collection or pancreatic mass. 2. Fatty infiltration of liver with a nonspecific 13 x 10 mm lesion in the RIGHT lobe. 3. Intermediate attenuation material within gallbladder question sludge or  stones, could potentially represent vicarious excretion of contrast material if patient has had recent contrast administration. 4. 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 5. Small uterine leiomyoma. 6. Umbilical hernia containing fat.  US-pelvise showed  1. No strong evidence of ovarian torsion, but Doppler evaluation of both ovaries was difficult and suboptimal. 2. Pedunculated 2.5 cm subserosal fibroid directed to the left from the anterior fundus. Smaller 9 mm intramural fibroid at the posterior body. 3. No pelvic free fluid  Review of Systems:   General: no fevers, chills, no changes in body weight, has poor appetite, has fatigue HEENT: no blurry vision, hearing changes or sore throat Respiratory: has dyspnea, no coughing, wheezing CV: no chest pain, no palpitations GI: has nausea, vomiting, abdominal pain, no diarrhea, constipation GU: no dysuria, burning on urination, increased urinary frequency, hematuria  Ext: no leg edema Neuro: no unilateral weakness, numbness, or tingling, no vision change or hearing loss Skin: no rash, no skin tear. MSK: No muscle spasm, no deformity, no limitation of range of movement in spin Heme: No easy bruising.  Travel history: No recent long distant travel.  Allergy:  Allergies  Allergen Reactions  . Ferrous Sulfate Other (See Comments)    Severe stomach pains and constipation    Past Medical History:  Diagnosis Date  . AKI (acute kidney injury) ([place]) 07/09/2016  . Anxiety   . HLD (hyperlipidemia) 07/09/2016  . Hypertension   . Thyroid disease   . Vaginal Pap smear, abnormal     Past Surgical History:  Procedure Laterality Date  . ECTOPIC PREGNANCY SURGERY    . LEEP      Social History:  reports  that she has been smoking Cigarettes.  She has been smoking about 1.00 pack per day. She has never used smokeless tobacco. She reports that she drinks alcohol. She reports that she does not use drugs.  Family History:  Family History  Problem Relation  Age of Onset  . Hypertension Mother   . Stroke Mother   . Arthritis Mother   . Alcohol abuse Mother   . Diabetes Mother   . Hypertension Father   . Alcohol abuse Father   . Diabetes Father      Prior to Admission medications   Medication Sig Start Date End Date Taking? Authorizing Provider  amLODipine (NORVASC) 5 MG tablet Take 1 tablet (5 mg total) by mouth daily. 01/12/16  Yes Saguier, Percell Miller, PA-C  aspirin EC 81 MG tablet Take 162 mg by mouth daily.   Yes [provider]  Choline Fenofibrate (FENOFIBRIC ACID) 45 MG CPDR TAKE 1 CAPSULE(45 MG) BY MOUTH DAILY 12/24/15  Yes Saguier, Percell Miller, PA-C  lisinopril (PRINIVIL,ZESTRIL) 20 MG tablet TAKE 1 TABLET(20 MG) BY MOUTH DAILY 01/12/16  Yes Saguier, Percell Miller, PA-C  ferrous sulfate 325 (65 FE) MG EC tablet Take 1 tablet (325 mg total) by mouth 3 (three) times daily with meals. Patient not taking: Reported on 06/24/2016 01/13/16   Mackie Pai, PA-C    Physical Exam: Vitals:   07/09/16 1800 07/09/16 1900 07/09/16 1955 07/09/16 2044  BP: (!) 148/90 (!) 123/56 (!) 143/86   Pulse: (!) 123 (!) 120 (!) 123 (!) 121  Resp:  20 20   Temp:  97.9 F (36.6 C) 98.1 F (36.7 C)   TempSrc:  Oral Oral   SpO2: 100% 100% 100%   Weight:      Height:       General: Not in acute distress. Dry mucus and membrane HEENT:       Eyes: PERRL, EOMI, no scleral icterus.       ENT: No discharge from the ears and nose, no pharynx injection, no tonsillar enlargement.        Neck: No JVD, no bruit, no mass felt. Heme: No neck lymph node enlargement. Cardiac: S1/S2, RRR, No murmurs, No gallops or rubs. Respiratory: No rales, wheezing, rhonchi or rubs. GI: Soft, nondistended, diffused tenderness, no rebound pain, no organomegaly, BS present. GU: No hematuria Ext: No pitting leg edema bilaterally. 2+DP/PT pulse bilaterally. Musculoskeletal: No joint deformities, No joint redness or warmth, no limitation of ROM in spin. Skin: No rashes.  Neuro: Alert,  oriented X3, cranial nerves II-XII grossly intact, moves all extremities normally Psych: Patient is not psychotic, no suicidal or hemocidal ideation.  Labs on Admission: I have personally reviewed following labs and imaging studies  CBC:  Recent Labs Lab 07/09/16 1246  WBC 9.4  NEUTROABS 7.4  HGB 16.9*  HCT 49.5*  MCV 109.8*  PLT 962   Basic Metabolic Panel:  Recent Labs Lab 07/09/16 1246  NA 138  K 5.4*  CL 101  CO2 <7*  GLUCOSE 249*  BUN 15  CREATININE 2.96*  CALCIUM 9.9   GFR: Estimated Creatinine Clearance: 27.7 mL/min (A) (by C-G formula based on SCr of 2.96 mg/dL (H)). Liver Function Tests:  Recent Labs Lab 07/09/16 1246  AST 168*  ALT 136*  ALKPHOS 84  BILITOT 2.4*  PROT 10.1*  ALBUMIN 5.6*    Recent Labs Lab 07/09/16 1246  LIPASE 1,875*   No results for input(s): AMMONIA in the last 168 hours. Coagulation Profile: No results for input(s): INR, PROTIME  in the last 168 hours. Cardiac Enzymes: No results for input(s): CKTOTAL, CKMB, CKMBINDEX, TROPONINI in the last 168 hours. BNP (last 3 results) No results for input(s): PROBNP in the last 8760 hours. HbA1C: No results for input(s): HGBA1C in the last 72 hours. CBG:  Recent Labs Lab 07/09/16 1737 07/09/16 1905 07/09/16 1951  GLUCAP 222* 198* 164*   Lipid Profile:  Recent Labs  07/09/16 1246  TRIG 370*   Thyroid Function Tests: No results for input(s): TSH, T4TOTAL, FREET4, T3FREE, THYROIDAB in the last 72 hours. Anemia Panel: No results for input(s): VITAMINB12, FOLATE, FERRITIN, TIBC, IRON, RETICCTPCT in the last 72 hours. Urine analysis:    Component Value Date/Time   COLORURINE AMBER (A) 07/09/2016 1533   APPEARANCEUR TURBID (A) 07/09/2016 1533   LABSPEC 1.018 07/09/2016 1533   PHURINE 5.0 07/09/2016 1533   GLUCOSEU NEGATIVE 07/09/2016 1533   HGBUR MODERATE (A) 07/09/2016 1533   BILIRUBINUR LARGE (A) 07/09/2016 1533   BILIRUBINUR neg 02/02/2015 1012   KETONESUR >80 (A)  07/09/2016 1533   PROTEINUR >300 (A) 07/09/2016 1533   UROBILINOGEN 0.2 02/02/2015 1012   UROBILINOGEN 0.2 04/19/2012 1206   NITRITE NEGATIVE 07/09/2016 1533   LEUKOCYTESUR NEGATIVE 07/09/2016 1533   Sepsis Labs: @LABRCNTIP (procalcitonin:4,lacticidven:4) )No results found for this or any previous visit (from the past 240 hour(s)).   Radiological Exams on Admission: US Transvaginal Non-ob  Result Date: 07/09/2016 CLINICAL DATA:  41 year old female with left lower quadrant and suprapubic pain for 3-4 days. Negative serum pregnancy test today. Premenopausal. LMP 5 weeks ago. EXAM: TRANSABDOMINAL AND TRANSVAGINAL ULTRASOUND OF PELVIS DOPPLER ULTRASOUND OF OVARIES TECHNIQUE: Both transabdominal and transvaginal ultrasound examinations of the pelvis were performed. Transabdominal technique was performed for global imaging of the pelvis including uterus, ovaries, adnexal regions, and pelvic cul-de-sac. It was necessary to proceed with endovaginal exam following the transabdominal exam to visualize the ovaries. Color and duplex Doppler ultrasound was utilized to evaluate blood flow to the ovaries. COMPARISON:  Ob ultrasound 11/03/2009. FINDINGS: Uterus Measurements: 6.9 x 3.4 x 4.4 cm. Small intramural 9 mm fibroid at the posterior body of the uterus (image 59). Larger 2.5 cm pedunculated subserosal fibroid at the anterior fundus directed toward the left (image 63). There is some vascularity detected within this fibroid (images 65 and 79). Endometrium Thickness: 4 mm. No focal abnormality visualized. Incidental nabothian cysts at the cervix. Right ovary Only visible trans abdominally. Measurements: 2.5 x 2.8 x 2.2 cm. Simple appearing 19 mm cyst associated with the right ovary (image 26). Left ovary Only visible trans abdominally. Measurements: 3.0 x 2.0 x 1.6 cm. Normal appearance/no adnexal mass. Pulsed Doppler evaluation of both ovaries was difficult to the ovaries could not be visualized with transvaginal  technique. Color Doppler flow is evident in both ovaries (right image 19 and left image 31) but spectral imaging could not be obtained. Other findings No abnormal free fluid. IMPRESSION: 1. No strong evidence of ovarian torsion, but Doppler evaluation of both ovaries was difficult and suboptimal. 2. Pedunculated 2.5 cm subserosal fibroid directed to the left from the anterior fundus. Smaller 9 mm intramural fibroid at the posterior body. 3. No pelvic free fluid. Electronically Signed   By: Genevie Ann M.D.   On: 07/09/2016 15:31   US Pelvis Complete  Result Date: 07/09/2016 CLINICAL DATA:  41 year old female with left lower quadrant and suprapubic pain for 3-4 days. Negative serum pregnancy test today. Premenopausal. LMP 5 weeks ago. EXAM: TRANSABDOMINAL AND TRANSVAGINAL ULTRASOUND OF PELVIS DOPPLER ULTRASOUND OF  OVARIES TECHNIQUE: Both transabdominal and transvaginal ultrasound examinations of the pelvis were performed. Transabdominal technique was performed for global imaging of the pelvis including uterus, ovaries, adnexal regions, and pelvic cul-de-sac. It was necessary to proceed with endovaginal exam following the transabdominal exam to visualize the ovaries. Color and duplex Doppler ultrasound was utilized to evaluate blood flow to the ovaries. COMPARISON:  Ob ultrasound 11/03/2009. FINDINGS: Uterus Measurements: 6.9 x 3.4 x 4.4 cm. Small intramural 9 mm fibroid at the posterior body of the uterus (image 59). Larger 2.5 cm pedunculated subserosal fibroid at the anterior fundus directed toward the left (image 63). There is some vascularity detected within this fibroid (images 65 and 79). Endometrium Thickness: 4 mm. No focal abnormality visualized. Incidental nabothian cysts at the cervix. Right ovary Only visible trans abdominally. Measurements: 2.5 x 2.8 x 2.2 cm. Simple appearing 19 mm cyst associated with the right ovary (image 26). Left ovary Only visible trans abdominally. Measurements: 3.0 x 2.0 x 1.6  cm. Normal appearance/no adnexal mass. Pulsed Doppler evaluation of both ovaries was difficult to the ovaries could not be visualized with transvaginal technique. Color Doppler flow is evident in both ovaries (right image 19 and left image 31) but spectral imaging could not be obtained. Other findings No abnormal free fluid. IMPRESSION: 1. No strong evidence of ovarian torsion, but Doppler evaluation of both ovaries was difficult and suboptimal. 2. Pedunculated 2.5 cm subserosal fibroid directed to the left from the anterior fundus. Smaller 9 mm intramural fibroid at the posterior body. 3. No pelvic free fluid. Electronically Signed   By: Genevie Ann M.D.   On: 07/09/2016 15:31   [place] Art/ven Flow Abd Pelv Doppler  Result Date: 07/09/2016 CLINICAL DATA:  41 year old female with left lower quadrant and suprapubic pain for 3-4 days. Negative serum pregnancy test today. Premenopausal. LMP 5 weeks ago. EXAM: TRANSABDOMINAL AND TRANSVAGINAL ULTRASOUND OF PELVIS DOPPLER ULTRASOUND OF OVARIES TECHNIQUE: Both transabdominal and transvaginal ultrasound examinations of the pelvis were performed. Transabdominal technique was performed for global imaging of the pelvis including uterus, ovaries, adnexal regions, and pelvic cul-de-sac. It was necessary to proceed with endovaginal exam following the transabdominal exam to visualize the ovaries. Color and duplex Doppler ultrasound was utilized to evaluate blood flow to the ovaries. COMPARISON:  Ob ultrasound 11/03/2009. FINDINGS: Uterus Measurements: 6.9 x 3.4 x 4.4 cm. Small intramural 9 mm fibroid at the posterior body of the uterus (image 59). Larger 2.5 cm pedunculated subserosal fibroid at the anterior fundus directed toward the left (image 63). There is some vascularity detected within this fibroid (images 65 and 79). Endometrium Thickness: 4 mm. No focal abnormality visualized. Incidental nabothian cysts at the cervix. Right ovary Only visible trans abdominally. Measurements:  2.5 x 2.8 x 2.2 cm. Simple appearing 19 mm cyst associated with the right ovary (image 26). Left ovary Only visible trans abdominally. Measurements: 3.0 x 2.0 x 1.6 cm. Normal appearance/no adnexal mass. Pulsed Doppler evaluation of both ovaries was difficult to the ovaries could not be visualized with transvaginal technique. Color Doppler flow is evident in both ovaries (right image 19 and left image 31) but spectral imaging could not be obtained. Other findings No abnormal free fluid. IMPRESSION: 1. No strong evidence of ovarian torsion, but Doppler evaluation of both ovaries was difficult and suboptimal. 2. Pedunculated 2.5 cm subserosal fibroid directed to the left from the anterior fundus. Smaller 9 mm intramural fibroid at the posterior body. 3. No pelvic free fluid. Electronically Signed   By: Lemmie Evens  [place] Crane M.D.   On: 07/09/2016 15:31   Ct Renal Stone Study  Result Date: 07/09/2016 CLINICAL DATA:  Nausea, vomiting, and diarrhea for 3 days, elevated lipase, acute kidney injury, elevated bilirubin, history hypertension EXAM: CT ABDOMEN AND PELVIS WITHOUT CONTRAST TECHNIQUE: Multidetector CT imaging of the abdomen and pelvis was performed following the standard protocol without IV contrast. Sagittal and coronal MPR images reconstructed from axial data set. Oral contrast was not administered. COMPARISON:  None FINDINGS: Lower chest: Lung bases clear Hepatobiliary: Diffuse fatty infiltration of liver. Nonspecific low-attenuation focus posterior RIGHT lobe liver image 26, 13 x 10 mm. Intermediate attenuation material within gallbladder which could represent sludge or numerous tiny calculi. No biliary dilatation or additional hepatic abnormalities Pancreas: Pancreas is enlarged with poorly defined peripancreatic planes compatible with acute pancreatitis. No discrete pancreatic mass or fluid collection. Spleen: Normal appearance Adrenals/Urinary Tract: Thickening of both adrenal glands with question mass at LEFT  adrenal gland 19 x 14 mm image 20. Kidneys, ureters, and bladder normal appearance. Stomach/Bowel: Normal appendix. Stomach and bowel loops normal appearance Vascular/Lymphatic: Aorta normal caliber with atherosclerotic calcification at bifurcation. No adenopathy. Few scattered pelvic phleboliths. Reproductive: Exophytic leiomyoma at LEFT uterus, 2.7 x 1.9 x 1.7 cm. Uterus and adnexa otherwise unremarkable. Other: Umbilical hernia containing fat.  No free air or free fluid. Musculoskeletal: No acute osseous findings. IMPRESSION: Acute pancreatitis without peripancreatic fluid collection or pancreatic mass. Fatty infiltration of liver with a nonspecific 13 x 10 mm lesion in the RIGHT lobe. Intermediate attenuation material within gallbladder question sludge or stones, could potentially represent vicarious excretion of contrast material if patient has had recent contrast administration. 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 Small uterine leiomyoma. Umbilical hernia containing fat. Electronically Signed   By: Lavonia Dana M.D.   On: 07/09/2016 16:31     EKG: Reviewed independently. Sinus rhythm, QTC 467, tachycardia, LAE.  Assessment/Plan Principal Problem:   Acute pancreatitis Active Problems:   Hypertension   Smoking   DKA (diabetic ketoacidoses) ([facility])   AKI (acute kidney injury) ([place])   Hyperkalemia   Alcohol use   Metabolic acidosis   Acute respiratory failure with hypoxia ([facility])   Liver lesion   HLD (hyperlipidemia)   Acute pancreatitis: Etiology is not clear. CT scan showed intermediate attenuation material within gallbladder question sludge or stones. Triglyceride is only 163-370. May be related to alcohol use. Patient states that she drinks 3 beers daily. Patient is on lisinopril, which is also a risk factor. Now pt developed possible DKA. Osmolality 332. Mental status normal.   -will admit SDU as inpt due possible DKA -IVF: 3LNS and then at 150 cc/hr -IV morphine for pain control, IV zofran for  nausea -IV protonix for possible alcoholic gastritis -US-RUQ   Metabolic acidosis possible due to DKA (diabetic ketoacidoses): Bicarbonate is less than 7 and blood sugar 249. Urinalysis is positive for ketone. ABG showed a pH 7.003. EDP consulted PCCM, Dr. Tamala Julian per EDP's note, "consulted with ICU (Dr. Tamala Julian) presentation likely related to DKA. Will Start on glucose stabilizer and admit to step down" - aggressive IVF as above  - start DKA protocol with BMP q4h - IVF: NS 150 cc/h; will switch to D5-1/2NS when CBG<250 - replete K as needed - Zofran prn nausea  - NPO  - check A1c  HTN: bp 123/56 -Hold lisinopril due to AKI and pancreatis -continue amlodipine -IV hydralazine when necessary  Hyperkalemia: K=5.4. -expect correction with insulin gtt -f/u by BMP -get EKG  Addendum: the repeated BMP showed  K5.9, Bicarbonate is 7. AG=14. -Kayexalate 30 g 1 -Sodium bicarbonate 50 mEq 1  AKI: Likely due to prerenal secondary to dehydration and continuation of ACEI and NSIADs - IVF as above - Check FeNa - Follow up renal function by BMP - Hold lisinopril and Ibuprofen   Tobacco abuse and Alcohol use: -Did counseling about importance of quitting smoking -Nicotine patch -Did counseling about the importance of quitting drinking -CIWA protocol  Nonspecific liver lesion: pt had an inccidental finding on CT scan, ratty infiltration of liver with a nonspecific 13 x 10 mm lesion in the RIGHT lobe. -please let pt f/u with PCP  Acute respiratory failure with hypoxia ([place]): This seems to be a transient issue. Currently oxygen saturation 100%. Patient does not have shortness of breath.  No CP, fever or cough. Pt is at risk of developing ARDS. -will get CXR.  HLD: LDL 75 on 01/12/16 and TG 370 today -hold fenofibrate   DVT ppx: SQ Lovenox Code Status: Full code Family Communication: None at bed side.   Disposition Plan:  Anticipate discharge back to previous home environment Consults  called:  PCCM, Dr. Tamala Julian Admission status:  SDU/inpation       Date of Service 07/09/2016    Ivor Costa [facility] Pager [phone]  If 7PM-7AM, please contact night-coverage [email or web address] Password [facility] 07/09/2016, 8:49 PM

## 2016-07-09 NOTE — ED Provider Notes (Signed)
[facility] DEPT [facility] Provider Note   CSN: [id number] Arrival date & time: 07/09/16  1135     History   Chief Complaint Chief Complaint  Patient presents with  . Abdominal Pain  . Nausea    HPI Terri Morris is a 41 y.o. female.  HPI  41 y.o. female with a hx of HTN, presents to the Emergency Department today due to N/V x 2 days. No diarrhea. Unable to tolerate PO due to nausea. Notes suprapubic discomfort and right left lower back pain. States pain was constant yesterday, but appears to be intermittent today. Noted cramping sensation and pain 8/10. Diffuse lower abdominal cramping. No vaginal bleeding/discharge. No dysuria. No CP/SOB. No meds PTA. Pt does not hx x 2 of ectopic pregnancies. Pt is sexually active and does not use OCPs. No fevers. No other symptom noted.   Past Medical History:  Diagnosis Date  . Anxiety   . Hypertension   . Thyroid disease   . Vaginal Pap smear, abnormal     Patient Active Problem List   Diagnosis Date Noted  . Wellness examination 02/02/2015  . Hypertension 01/19/2015  . Smoking 01/19/2015  . Anxiety state 01/19/2015  . Elevated TSH 01/19/2015    Past Surgical History:  Procedure Laterality Date  . ECTOPIC PREGNANCY SURGERY    . LEEP      OB History    Gravida Para Term Preterm AB Living   4 1 1   3      SAB TAB Ectopic Multiple Live Births       3   1       Home Medications    Prior to Admission medications   Medication Sig Start Date End Date Taking? Authorizing Provider  amLODipine (NORVASC) 5 MG tablet Take 1 tablet (5 mg total) by mouth daily. 01/12/16  Yes Saguier, Percell Miller, PA-C  aspirin EC 81 MG tablet Take 162 mg by mouth daily.   Yes [provider]  Choline Fenofibrate (FENOFIBRIC ACID) 45 MG CPDR TAKE 1 CAPSULE(45 MG) BY MOUTH DAILY 12/24/15  Yes Saguier, Percell Miller, PA-C  lisinopril (PRINIVIL,ZESTRIL) 20 MG tablet TAKE 1 TABLET(20 MG) BY MOUTH DAILY 01/12/16  Yes Saguier, Percell Miller, PA-C  ferrous sulfate  325 (65 FE) MG EC tablet Take 1 tablet (325 mg total) by mouth 3 (three) times daily with meals. Patient not taking: Reported on 06/24/2016 01/13/16   Saguier, Percell Miller, PA-C    Family History Family History  Problem Relation Age of Onset  . Hypertension Mother   . Stroke Mother   . Arthritis Mother   . Alcohol abuse Mother   . Diabetes Mother   . Hypertension Father   . Alcohol abuse Father   . Diabetes Father     Social History Social History  Substance Use Topics  . Smoking status: Heavy Tobacco Smoker    Packs/day: 1.00    Types: Cigarettes  . Smokeless tobacco: Never Used  . Alcohol use 0.0 oz/week     Comment: 3 glasses of wine on weekends     Allergies   Patient has no known allergies.   Review of Systems Review of Systems ROS reviewed and all are negative for acute change except as noted in the HPI.  Physical Exam Updated Vital Signs BP 122/89 (BP Location: Right Arm)   Pulse (!) 130   Temp 98.2 F (36.8 C) (Oral)   Resp 18   Ht 5\' 11"  (1.803 m)   Wt 82.1 kg (181 lb)  LMP 05/18/2016 (Approximate)   SpO2 99%   BMI 25.24 kg/m   Physical Exam  Constitutional: She is oriented to person, place, and time. Vital signs are normal. She appears well-developed and well-nourished.  HENT:  Head: Normocephalic and atraumatic.  Right Ear: Hearing normal.  Left Ear: Hearing normal.  Eyes: Conjunctivae and EOM are normal. Pupils are equal, round, and reactive to light.  Neck: Normal range of motion. Neck supple.  Cardiovascular: Regular rhythm, normal heart sounds and intact distal pulses.  Tachycardia present.   Pulmonary/Chest: Effort normal and breath sounds normal.  Abdominal: Soft. Normal appearance and bowel sounds are normal. There is tenderness in the right lower quadrant, suprapubic area and left lower quadrant. There is no rigidity, no rebound, no guarding, no CVA tenderness, no tenderness at McBurney's point and negative Murphy's sign.  Abdomen soft    Musculoskeletal: Normal range of motion.  Neurological: She is alert and oriented to person, place, and time.  Skin: Skin is warm and dry.  Psychiatric: She has a normal mood and affect. Her speech is normal and behavior is normal. Thought content normal.  Nursing note and vitals reviewed.  ED Treatments / Results  Labs (all labs ordered are listed, but only abnormal results are displayed) Labs Reviewed  CBC WITH DIFFERENTIAL/PLATELET - Abnormal; Notable for the following:       Result Value   Hemoglobin 16.9 (*)    HCT 49.5 (*)    MCV 109.8 (*)    MCH 37.5 (*)    RDW 17.8 (*)    All other components within normal limits  COMPREHENSIVE METABOLIC PANEL - Abnormal; Notable for the following:    Potassium 5.4 (*)    CO2 <7 (*)    Glucose, Bld 249 (*)    Creatinine, Ser 2.96 (*)    Total Protein 10.1 (*)    Albumin 5.6 (*)    AST 168 (*)    ALT 136 (*)    Total Bilirubin 2.4 (*)    GFR calc non Af Amer 18 (*)    GFR calc Af Amer 21 (*)    All other components within normal limits  LIPASE, BLOOD - Abnormal; Notable for the following:    Lipase 1,875 (*)    All other components within normal limits  ACETAMINOPHEN LEVEL - Abnormal; Notable for the following:    Acetaminophen (Tylenol), Serum <10 (*)    All other components within normal limits  RAPID URINE DRUG SCREEN, HOSP PERFORMED - Abnormal; Notable for the following:    Opiates POSITIVE (*)    All other components within normal limits  I-STAT VENOUS BLOOD GAS, ED - Abnormal; Notable for the following:    pH, Ven 7.003 (*)    pCO2, Ven 26.1 (*)    pO2, Ven 30.0 (*)    Bicarbonate 6.5 (*)    Acid-base deficit 24.0 (*)    All other components within normal limits  HCG, SERUM, QUALITATIVE  SALICYLATE LEVEL  ETHANOL  URINALYSIS, ROUTINE W REFLEX MICROSCOPIC  OSMOLALITY  I-STAT CG4 LACTIC ACID, ED   EKG  EKG Interpretation None      Radiology US Transvaginal Non-ob  Result Date: 07/09/2016 CLINICAL DATA:   41 year old female with left lower quadrant and suprapubic pain for 3-4 days. Negative serum pregnancy test today. Premenopausal. LMP 5 weeks ago. EXAM: TRANSABDOMINAL AND TRANSVAGINAL ULTRASOUND OF PELVIS DOPPLER ULTRASOUND OF OVARIES TECHNIQUE: Both transabdominal and transvaginal ultrasound examinations of the pelvis were performed. Transabdominal technique was performed for  global imaging of the pelvis including uterus, ovaries, adnexal regions, and pelvic cul-de-sac. It was necessary to proceed with endovaginal exam following the transabdominal exam to visualize the ovaries. Color and duplex Doppler ultrasound was utilized to evaluate blood flow to the ovaries. COMPARISON:  Ob ultrasound 11/03/2009. FINDINGS: Uterus Measurements: 6.9 x 3.4 x 4.4 cm. Small intramural 9 mm fibroid at the posterior body of the uterus (image 59). Larger 2.5 cm pedunculated subserosal fibroid at the anterior fundus directed toward the left (image 63). There is some vascularity detected within this fibroid (images 65 and 79). Endometrium Thickness: 4 mm. No focal abnormality visualized. Incidental nabothian cysts at the cervix. Right ovary Only visible trans abdominally. Measurements: 2.5 x 2.8 x 2.2 cm. Simple appearing 19 mm cyst associated with the right ovary (image 26). Left ovary Only visible trans abdominally. Measurements: 3.0 x 2.0 x 1.6 cm. Normal appearance/no adnexal mass. Pulsed Doppler evaluation of both ovaries was difficult to the ovaries could not be visualized with transvaginal technique. Color Doppler flow is evident in both ovaries (right image 19 and left image 31) but spectral imaging could not be obtained. Other findings No abnormal free fluid. IMPRESSION: 1. No strong evidence of ovarian torsion, but Doppler evaluation of both ovaries was difficult and suboptimal. 2. Pedunculated 2.5 cm subserosal fibroid directed to the left from the anterior fundus. Smaller 9 mm intramural fibroid at the posterior body. 3.  No pelvic free fluid. Electronically Signed   By: Genevie Ann M.D.   On: 07/09/2016 15:31   US Pelvis Complete  Result Date: 07/09/2016 CLINICAL DATA:  41 year old female with left lower quadrant and suprapubic pain for 3-4 days. Negative serum pregnancy test today. Premenopausal. LMP 5 weeks ago. EXAM: TRANSABDOMINAL AND TRANSVAGINAL ULTRASOUND OF PELVIS DOPPLER ULTRASOUND OF OVARIES TECHNIQUE: Both transabdominal and transvaginal ultrasound examinations of the pelvis were performed. Transabdominal technique was performed for global imaging of the pelvis including uterus, ovaries, adnexal regions, and pelvic cul-de-sac. It was necessary to proceed with endovaginal exam following the transabdominal exam to visualize the ovaries. Color and duplex Doppler ultrasound was utilized to evaluate blood flow to the ovaries. COMPARISON:  Ob ultrasound 11/03/2009. FINDINGS: Uterus Measurements: 6.9 x 3.4 x 4.4 cm. Small intramural 9 mm fibroid at the posterior body of the uterus (image 59). Larger 2.5 cm pedunculated subserosal fibroid at the anterior fundus directed toward the left (image 63). There is some vascularity detected within this fibroid (images 65 and 79). Endometrium Thickness: 4 mm. No focal abnormality visualized. Incidental nabothian cysts at the cervix. Right ovary Only visible trans abdominally. Measurements: 2.5 x 2.8 x 2.2 cm. Simple appearing 19 mm cyst associated with the right ovary (image 26). Left ovary Only visible trans abdominally. Measurements: 3.0 x 2.0 x 1.6 cm. Normal appearance/no adnexal mass. Pulsed Doppler evaluation of both ovaries was difficult to the ovaries could not be visualized with transvaginal technique. Color Doppler flow is evident in both ovaries (right image 19 and left image 31) but spectral imaging could not be obtained. Other findings No abnormal free fluid. IMPRESSION: 1. No strong evidence of ovarian torsion, but Doppler evaluation of both ovaries was difficult and  suboptimal. 2. Pedunculated 2.5 cm subserosal fibroid directed to the left from the anterior fundus. Smaller 9 mm intramural fibroid at the posterior body. 3. No pelvic free fluid. Electronically Signed   By: Genevie Ann M.D.   On: 07/09/2016 15:31   [place] Art/ven Flow Abd Pelv Doppler  Result Date: 07/09/2016 CLINICAL DATA:  41 year old female with left lower quadrant and suprapubic pain for 3-4 days. Negative serum pregnancy test today. Premenopausal. LMP 5 weeks ago. EXAM: TRANSABDOMINAL AND TRANSVAGINAL ULTRASOUND OF PELVIS DOPPLER ULTRASOUND OF OVARIES TECHNIQUE: Both transabdominal and transvaginal ultrasound examinations of the pelvis were performed. Transabdominal technique was performed for global imaging of the pelvis including uterus, ovaries, adnexal regions, and pelvic cul-de-sac. It was necessary to proceed with endovaginal exam following the transabdominal exam to visualize the ovaries. Color and duplex Doppler ultrasound was utilized to evaluate blood flow to the ovaries. COMPARISON:  Ob ultrasound 11/03/2009. FINDINGS: Uterus Measurements: 6.9 x 3.4 x 4.4 cm. Small intramural 9 mm fibroid at the posterior body of the uterus (image 59). Larger 2.5 cm pedunculated subserosal fibroid at the anterior fundus directed toward the left (image 63). There is some vascularity detected within this fibroid (images 65 and 79). Endometrium Thickness: 4 mm. No focal abnormality visualized. Incidental nabothian cysts at the cervix. Right ovary Only visible trans abdominally. Measurements: 2.5 x 2.8 x 2.2 cm. Simple appearing 19 mm cyst associated with the right ovary (image 26). Left ovary Only visible trans abdominally. Measurements: 3.0 x 2.0 x 1.6 cm. Normal appearance/no adnexal mass. Pulsed Doppler evaluation of both ovaries was difficult to the ovaries could not be visualized with transvaginal technique. Color Doppler flow is evident in both ovaries (right image 19 and left image 31) but spectral imaging could  not be obtained. Other findings No abnormal free fluid. IMPRESSION: 1. No strong evidence of ovarian torsion, but Doppler evaluation of both ovaries was difficult and suboptimal. 2. Pedunculated 2.5 cm subserosal fibroid directed to the left from the anterior fundus. Smaller 9 mm intramural fibroid at the posterior body. 3. No pelvic free fluid. Electronically Signed   By: Genevie Ann M.D.   On: 07/09/2016 15:31   Ct Renal Stone Study  Result Date: 07/09/2016 CLINICAL DATA:  Nausea, vomiting, and diarrhea for 3 days, elevated lipase, acute kidney injury, elevated bilirubin, history hypertension EXAM: CT ABDOMEN AND PELVIS WITHOUT CONTRAST TECHNIQUE: Multidetector CT imaging of the abdomen and pelvis was performed following the standard protocol without IV contrast. Sagittal and coronal MPR images reconstructed from axial data set. Oral contrast was not administered. COMPARISON:  None FINDINGS: Lower chest: Lung bases clear Hepatobiliary: Diffuse fatty infiltration of liver. Nonspecific low-attenuation focus posterior RIGHT lobe liver image 26, 13 x 10 mm. Intermediate attenuation material within gallbladder which could represent sludge or numerous tiny calculi. No biliary dilatation or additional hepatic abnormalities Pancreas: Pancreas is enlarged with poorly defined peripancreatic planes compatible with acute pancreatitis. No discrete pancreatic mass or fluid collection. Spleen: Normal appearance Adrenals/Urinary Tract: Thickening of both adrenal glands with question mass at LEFT adrenal gland 19 x 14 mm image 20. Kidneys, ureters, and bladder normal appearance. Stomach/Bowel: Normal appendix. Stomach and bowel loops normal appearance Vascular/Lymphatic: Aorta normal caliber with atherosclerotic calcification at bifurcation. No adenopathy. Few scattered pelvic phleboliths. Reproductive: Exophytic leiomyoma at LEFT uterus, 2.7 x 1.9 x 1.7 cm. Uterus and adnexa otherwise unremarkable. Other: Umbilical hernia  containing fat.  No free air or free fluid. Musculoskeletal: No acute osseous findings. IMPRESSION: Acute pancreatitis without peripancreatic fluid collection or pancreatic mass. Fatty infiltration of liver with a nonspecific 13 x 10 mm lesion in the RIGHT lobe. Intermediate attenuation material within gallbladder question sludge or stones, could potentially represent vicarious excretion of contrast material if patient has had recent contrast administration. 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 Small uterine leiomyoma. Umbilical hernia containing fat. Electronically Signed  By: Lavonia Dana M.D.   On: 07/09/2016 16:31   Procedures Procedures (including critical care time) CRITICAL CARE Performed by: Ozella Rocks  Total critical care time: 60 minutes  Critical care time was exclusive of separately billable procedures and treating other patients.  Critical care was necessary to treat or prevent imminent or life-threatening deterioration.  Critical care was time spent personally by me on the following activities: development of treatment plan with patient and/or surrogate as well as nursing, discussions with consultants, evaluation of patient's response to treatment, examination of patient, obtaining history from patient or surrogate, ordering and performing treatments and interventions, ordering and review of laboratory studies, ordering and review of radiographic studies, pulse oximetry and re-evaluation of patient's condition.   Medications Ordered in ED Medications  sodium chloride 0.9 % bolus 1,000 mL (not administered)  sodium chloride 0.9 % bolus 1,000 mL (0 mLs Intravenous Stopped 07/09/16 1350)  ondansetron (ZOFRAN) injection 4 mg (4 mg Intravenous Given 07/09/16 1257)  morphine 4 MG/ML injection 4 mg (4 mg Intravenous Given 07/09/16 1303)   Initial Impression / Assessment and Plan / ED Course  I have reviewed the triage vital signs and the nursing notes.  Pertinent labs & imaging results that were  available during my care of the patient were reviewed by me and considered in my medical decision making (see chart for details).  Final Clinical Impressions(s) / ED Diagnoses  {I have reviewed and evaluated the relevant laboratory values. {I have reviewed and evaluated the relevant imaging studies.  {I have reviewed the relevant previous healthcare records.  {I obtained HPI from historian. {Patient discussed with supervising physician.  ED Course:  Assessment: Pt is a 41 y.o. female with hx HTN, Ectopic pregnancy who presents with lower abdominal pain with associated N/V x 2 days. No fevers. No vaginal bleeding/discharge. Noted hx etopic pregnancy x 2. sexually active. No OCPs. On exam, pt in NAD. Nontoxic/nonseptic appearing. VS with tachycardia. Normotensive. Afebrile. Lungs CTA. Heart RRR. Abdomen Soft, but diffusely tender lower quandrants. Beta HCG negative. Pelvic US unremarkable. iStat Lacic Acid 1.89 CBC without leukocytosis. CMP shows AKI with creatinine 2.96. Mildly elevated liver enzymes. potassium 5.4. Glucose 249. Gap undetectable due to low CO2. iStat VBG shows pH 7.003. Bicarb 6.5. Lipase extremely elevated 1,875. This is an unusual presentation for DKA as patient without hx DM, but lab results support. Acute pancreatitis could lead to similar. Pt does drink ETOH, but not in excess. Denies overdose on salicylates. No ingestion of ethylene glycol. CT abdomen without contrast ordered due to elevated lipase and AKI for possible alternative etiologies. Results show acute pancreatitis. No mass. Questionable sludge or stones on gallbladder. Pt non TTP RUQ. UA with ketones. Given 2L NS bolus and analgesia in ED. Started on glucose stabilizer. Discussed with attending physician who has seen patient. Clinically pt is stable and very well appearing, suprisingly given lab results. Consulted with ICU (Dr. Tamala Julian) presentation likely related to DKA. Will Start on glucose stabilizer and admit to step down.  Plan is to [facility].  Disposition/Plan:  Admit Pt acknowledges and agrees with plan  Supervising Physician Sherwood Gambler, MD  Final diagnoses:  AKI (acute kidney injury) ([place])  Acute pancreatitis, unspecified complication status, unspecified pancreatitis type  Hyperkalemia  Metabolic acidosis  Diabetic ketoacidosis without coma associated with other specified diabetes mellitus [facility])    New Prescriptions New Prescriptions   No medications on file     Shary Decamp, PA-C 07/09/16 1642    Regenia Skeeter,  Nicki Reaper, MD 07/15/16 [phone]

## 2016-07-09 NOTE — ED Notes (Signed)
Pt on cardiac monitor and auto VS 

## 2016-07-09 NOTE — ED Triage Notes (Signed)
Pt c/o nausea and vomiting for 2 days.  Unable to keep fluids or food down.  Denies diarrhea.  States suprapubic discomfort and lower left back pains.

## 2016-07-09 NOTE — ED Notes (Signed)
Dr. Blaine Hamper updated by phone about high serum osmolality = [phone]

## 2016-07-10 ENCOUNTER — Inpatient Hospital Stay (HOSPITAL_COMMUNITY): Payer: PRIVATE HEALTH INSURANCE

## 2016-07-10 ENCOUNTER — Encounter (HOSPITAL_COMMUNITY): Payer: Self-pay

## 2016-07-10 DIAGNOSIS — Z720 Tobacco use: Secondary | ICD-10-CM

## 2016-07-10 DIAGNOSIS — E784 Other hyperlipidemia: Secondary | ICD-10-CM

## 2016-07-10 DIAGNOSIS — F1010 Alcohol abuse, uncomplicated: Secondary | ICD-10-CM

## 2016-07-10 DIAGNOSIS — R400 Somnolence: Secondary | ICD-10-CM

## 2016-07-10 DIAGNOSIS — J9601 Acute respiratory failure with hypoxia: Secondary | ICD-10-CM

## 2016-07-10 LAB — GLUCOSE, CAPILLARY
GLUCOSE-CAPILLARY: 120 mg/dL — AB (ref 65–99)
GLUCOSE-CAPILLARY: 155 mg/dL — AB (ref 65–99)
GLUCOSE-CAPILLARY: 164 mg/dL — AB (ref 65–99)
GLUCOSE-CAPILLARY: 176 mg/dL — AB (ref 65–99)
GLUCOSE-CAPILLARY: 177 mg/dL — AB (ref 65–99)
GLUCOSE-CAPILLARY: 184 mg/dL — AB (ref 65–99)
GLUCOSE-CAPILLARY: 199 mg/dL — AB (ref 65–99)
GLUCOSE-CAPILLARY: 199 mg/dL — AB (ref 65–99)
Glucose-Capillary: 156 mg/dL — ABNORMAL HIGH (ref 65–99)
Glucose-Capillary: 168 mg/dL — ABNORMAL HIGH (ref 65–99)
Glucose-Capillary: 179 mg/dL — ABNORMAL HIGH (ref 65–99)
Glucose-Capillary: 174 mg/dL — ABNORMAL HIGH (ref 65–99)
Glucose-Capillary: 135 mg/dL — ABNORMAL HIGH (ref 65–99)
Glucose-Capillary: 159 mg/dL — ABNORMAL HIGH (ref 65–99)
Glucose-Capillary: 162 mg/dL — ABNORMAL HIGH (ref 65–99)
Glucose-Capillary: 194 mg/dL — ABNORMAL HIGH (ref 65–99)

## 2016-07-10 LAB — MRSA PCR SCREENING: MRSA by PCR: NEGATIVE

## 2016-07-10 LAB — LIPID PANEL
CHOLESTEROL: 199 mg/dL (ref 0–200)
HDL: 49 mg/dL (ref >40)
LDL Cholesterol: 115 mg/dL — ABNORMAL HIGH (ref 0–99)
TRIGLYCERIDES: 173 mg/dL — AB (ref <150)
Total CHOL/HDL Ratio: 4.1 RATIO
VLDL: 35 mg/dL (ref 0–40)

## 2016-07-10 LAB — BASIC METABOLIC PANEL
ANION GAP: 17 — AB (ref 5–15)
Anion gap: 14 (ref 5–15)
Anion gap: 14 (ref 5–15)
Anion gap: 17 — ABNORMAL HIGH (ref 5–15)
Anion gap: 15 (ref 5–15)
BUN: 14 mg/dL (ref 6–20)
BUN: 12 mg/dL (ref 6–20)
BUN: 12 mg/dL (ref 6–20)
BUN: 11 mg/dL (ref 6–20)
BUN: 11 mg/dL (ref 6–20)
CALCIUM: 8.5 mg/dL — AB (ref 8.9–10.3)
CALCIUM: 8.8 mg/dL — AB (ref 8.9–10.3)
CHLORIDE: 114 mmol/L — AB (ref 101–111)
CHLORIDE: 115 mmol/L — AB (ref 101–111)
CHLORIDE: 112 mmol/L — AB (ref 101–111)
CO2: 8 mmol/L — AB (ref 22–32)
CO2: 11 mmol/L — AB (ref 22–32)
CO2: 10 mmol/L — AB (ref 22–32)
CO2: 15 mmol/L — AB (ref 22–32)
CO2: 15 mmol/L — AB (ref 22–32)
CREATININE: 1.55 mg/dL — AB (ref 0.44–1.00)
Calcium: 8.5 mg/dL — ABNORMAL LOW (ref 8.9–10.3)
Calcium: 8.7 mg/dL — ABNORMAL LOW (ref 8.9–10.3)
Calcium: 8.6 mg/dL — ABNORMAL LOW (ref 8.9–10.3)
Chloride: 115 mmol/L — ABNORMAL HIGH (ref 101–111)
Chloride: 111 mmol/L (ref 101–111)
Creatinine, Ser: 2.19 mg/dL — ABNORMAL HIGH (ref 0.44–1.00)
Creatinine, Ser: 1.87 mg/dL — ABNORMAL HIGH (ref 0.44–1.00)
Creatinine, Ser: 1.73 mg/dL — ABNORMAL HIGH (ref 0.44–1.00)
Creatinine, Ser: 1.54 mg/dL — ABNORMAL HIGH (ref 0.44–1.00)
GFR calc Af Amer: 31 mL/min — ABNORMAL LOW (ref >60)
GFR calc Af Amer: 37 mL/min — ABNORMAL LOW (ref >60)
GFR calc Af Amer: 41 mL/min — ABNORMAL LOW (ref >60)
GFR calc non Af Amer: 27 mL/min — ABNORMAL LOW (ref >60)
GFR calc non Af Amer: 32 mL/min — ABNORMAL LOW (ref >60)
GFR calc non Af Amer: 35 mL/min — ABNORMAL LOW (ref >60)
GFR calc non Af Amer: 41 mL/min — ABNORMAL LOW (ref >60)
GFR, EST AFRICAN AMERICAN: 47 mL/min — AB (ref >60)
GFR, EST AFRICAN AMERICAN: 47 mL/min — AB (ref >60)
GFR, EST NON AFRICAN AMERICAN: 40 mL/min — AB (ref >60)
GLUCOSE: 174 mg/dL — AB (ref 65–99)
Glucose, Bld: 184 mg/dL — ABNORMAL HIGH (ref 65–99)
Glucose, Bld: 154 mg/dL — ABNORMAL HIGH (ref 65–99)
Glucose, Bld: 173 mg/dL — ABNORMAL HIGH (ref 65–99)
Glucose, Bld: 195 mg/dL — ABNORMAL HIGH (ref 65–99)
POTASSIUM: 4.2 mmol/L (ref 3.5–5.1)
POTASSIUM: 4 mmol/L (ref 3.5–5.1)
POTASSIUM: 3.6 mmol/L (ref 3.5–5.1)
Potassium: 5.4 mmol/L — ABNORMAL HIGH (ref 3.5–5.1)
Potassium: 4.1 mmol/L (ref 3.5–5.1)
SODIUM: 139 mmol/L (ref 135–145)
SODIUM: 143 mmol/L (ref 135–145)
SODIUM: 142 mmol/L (ref 135–145)
Sodium: 140 mmol/L (ref 135–145)
Sodium: 139 mmol/L (ref 135–145)

## 2016-07-10 LAB — PROTIME-INR
INR: 1.13
Prothrombin Time: 14.5 seconds (ref 11.4–15.2)

## 2016-07-10 LAB — BLOOD GAS, ARTERIAL
Acid-base deficit: 7.6 mmol/L — ABNORMAL HIGH (ref 0.0–2.0)
Bicarbonate: 16.3 mmol/L — ABNORMAL LOW (ref 20.0–28.0)
DRAWN BY: 213381
FIO2: 21
O2 Saturation: 98 %
PCO2 ART: 26.8 mmHg — AB (ref 32.0–48.0)
PH ART: 7.399 (ref 7.350–7.450)
Patient temperature: 98.3
pO2, Arterial: 88.2 mmHg (ref 83.0–108.0)

## 2016-07-10 LAB — AMMONIA: AMMONIA: 109 umol/L — AB (ref 9–35)

## 2016-07-10 LAB — LIPASE, BLOOD: LIPASE: 2009 U/L — AB (ref 11–51)

## 2016-07-10 LAB — CREATININE, URINE, RANDOM: CREATININE, URINE: 114.69 mg/dL

## 2016-07-10 LAB — SODIUM, URINE, RANDOM: SODIUM UR: 79 mmol/L

## 2016-07-10 LAB — HIV ANTIBODY (ROUTINE TESTING W REFLEX): HIV Screen 4th Generation wRfx: NONREACTIVE

## 2016-07-10 LAB — MAGNESIUM: MAGNESIUM: 1.7 mg/dL (ref 1.7–2.4)

## 2016-07-10 MED ORDER — SODIUM BICARBONATE 8.4 % IV SOLN
50.0000 meq | Freq: Once | INTRAVENOUS | Status: AC
  Administered 2016-07-10: 50 meq via INTRAVENOUS
  Filled 2016-07-10: qty 50

## 2016-07-10 MED ORDER — LACTULOSE 10 GM/15ML PO SOLN: 30.0000 g | Freq: Three times a day (TID) | ORAL | Status: DC

## 2016-07-10 MED ORDER — HYDRALAZINE HCL 20 MG/ML IJ SOLN
5.0000 mg | INTRAMUSCULAR | Status: DC | PRN
  Administered 2016-07-10: 5 mg via INTRAVENOUS
  Filled 2016-07-10: qty 1

## 2016-07-10 MED ORDER — ENOXAPARIN SODIUM 40 MG/0.4ML ~~LOC~~ SOLN
40.0000 mg | SUBCUTANEOUS | Status: DC
  Administered 2016-07-10 – 2016-07-16 (×7): 40 mg via SUBCUTANEOUS
  Filled 2016-07-10 (×7): qty 0.4

## 2016-07-10 MED ORDER — SODIUM POLYSTYRENE SULFONATE 15 GM/60ML PO SUSP
30.0000 g | Freq: Once | ORAL | Status: AC
  Administered 2016-07-10: 30 g via ORAL
  Filled 2016-07-10: qty 120

## 2016-07-10 MED ORDER — INSULIN GLARGINE 100 UNIT/ML ~~LOC~~ SOLN
10.0000 [IU] | Freq: Every day | SUBCUTANEOUS | Status: DC
  Administered 2016-07-10 – 2016-07-13 (×4): 10 [IU] via SUBCUTANEOUS
  Filled 2016-07-10 (×4): qty 0.1

## 2016-07-10 MED ORDER — SODIUM CHLORIDE 0.9 % IV SOLN
INTRAVENOUS | Status: DC
  Filled 2016-07-10: qty 1

## 2016-07-10 MED ORDER — INSULIN ASPART 100 UNIT/ML ~~LOC~~ SOLN
0.0000 [IU] | SUBCUTANEOUS | Status: DC
  Administered 2016-07-10 (×3): 2 [IU] via SUBCUTANEOUS
  Administered 2016-07-11: 1 [IU] via SUBCUTANEOUS
  Administered 2016-07-11 – 2016-07-12 (×2): 2 [IU] via SUBCUTANEOUS
  Administered 2016-07-12: 1 [IU] via SUBCUTANEOUS
  Administered 2016-07-12: 2 [IU] via SUBCUTANEOUS
  Administered 2016-07-12 – 2016-07-13 (×3): 1 [IU] via SUBCUTANEOUS
  Administered 2016-07-13: 3 [IU] via SUBCUTANEOUS

## 2016-07-10 MED ORDER — MORPHINE SULFATE (PF) 4 MG/ML IV SOLN
1.0000 mg | INTRAVENOUS | Status: DC | PRN
  Administered 2016-07-10 – 2016-07-13 (×11): 1 mg via INTRAVENOUS
  Filled 2016-07-10 (×11): qty 1

## 2016-07-10 MED ORDER — GADOBENATE DIMEGLUMINE 529 MG/ML IV SOLN
8.0000 mL | Freq: Once | INTRAVENOUS | Status: AC | PRN
  Administered 2016-07-10: 8 mL via INTRAVENOUS

## 2016-07-10 MED ORDER — PNEUMOCOCCAL VAC POLYVALENT 25 MCG/0.5ML IJ INJ: 0.5000 mL | INJECTION | INTRAMUSCULAR | Status: DC | PRN

## 2016-07-10 MED ORDER — MORPHINE SULFATE (PF) 2 MG/ML IV SOLN: 1.0000 mg | INTRAVENOUS | Status: DC | PRN

## 2016-07-10 MED ORDER — LACTULOSE 10 GM/15ML PO SOLN
30.0000 g | Freq: Three times a day (TID) | ORAL | Status: DC
  Administered 2016-07-10 – 2016-07-12 (×5): 30 g via ORAL
  Filled 2016-07-10 (×5): qty 45

## 2016-07-10 MED ORDER — SODIUM CHLORIDE 0.45 % IV SOLN
INTRAVENOUS | Status: DC
  Administered 2016-07-10 – 2016-07-11 (×2): via INTRAVENOUS
  Administered 2016-07-11: 100 mL/h via INTRAVENOUS

## 2016-07-10 MED ORDER — SODIUM CHLORIDE 0.9 % IV BOLUS (SEPSIS)
1000.0000 mL | Freq: Once | INTRAVENOUS | Status: AC
  Administered 2016-07-10: 1000 mL via INTRAVENOUS

## 2016-07-10 MED ORDER — SODIUM BICARBONATE 8.4 % IV SOLN
100.0000 meq | Freq: Once | INTRAVENOUS | Status: AC
  Administered 2016-07-10: 100 meq via INTRAVENOUS
  Filled 2016-07-10: qty 100

## 2016-07-10 MED ORDER — METOPROLOL TARTRATE 5 MG/5ML IV SOLN
5.0000 mg | Freq: Three times a day (TID) | INTRAVENOUS | Status: DC
  Administered 2016-07-10 – 2016-07-12 (×5): 5 mg via INTRAVENOUS
  Filled 2016-07-10 (×5): qty 5

## 2016-07-10 NOTE — Progress Notes (Signed)
Dr Blaine Hamper came up to see patient and was updated on her VS, labs and general condition, will put in orders and also gave some verbal orders for IVF's, will hang ASAP.

## 2016-07-10 NOTE — Plan of Care (Signed)
Problem: Pain Managment: Goal: General experience of comfort will improve Outcome: Progressing Patient is able to verbalize pain, where it is, the quality, duration, radiation and when she needs something for pain, will continue to monitor her pain and medicate as needed.

## 2016-07-10 NOTE — Plan of Care (Addendum)
MD requested patient be placed in a higher level of care. Patient was placed in 75M room 2 with eLink capability. A message was left on husband's cell phone that the patient has been moved.

## 2016-07-10 NOTE — Progress Notes (Signed)
Report received in patient's room via [facility] RN using SBAR format, reviewed PMH, some labs and test results, meds,  general condition of patient, assumed care of patient.

## 2016-07-10 NOTE — Plan of Care (Signed)
Problem: Safety: Goal: Ability to remain free from injury will improve Outcome: Progressing Explained to patient the importance of using the call light or white phone with the names and numbers of her RN/NT, all personal items within reach and bed alarm was placed on and I explained to her that because of how sick she is and in unfamiliar surroundings, it's important to keep her safe from falling OOB so the bed alarm would go off to remind her to call for assistance and patient verbalized understanding.

## 2016-07-10 NOTE — Progress Notes (Signed)
Received pt from [facility] [place] per bed placed in room [phone]

## 2016-07-10 NOTE — Progress Notes (Signed)
Text paged Dr Myna Hidalgo from [place] since patient's HR still remains elevated and she attempted to get OOB to void within minutes after I asked her if she had to go, bed alarm went off and [employer] went into see what was going on and her HR was in the 140-160 range at that time, assisted her back to bed but was unable to get a bed pan quick enough and she voided in the bed basin and also on her bed, will change bed. Dr called back very quickly and gave orders for another liter bolus of IVF's, will give and continue to monitor, no other changes noted.

## 2016-07-10 NOTE — Progress Notes (Signed)
PROGRESS NOTE    Terri Morris  HER:[id number] DOB: 1975/12/06 DOA: 07/09/2016 PCP: Mackie Pai, PA-C   Brief Narrative:   41 y.o. BF PMHx EtOH abuse, Anxiety, HTN, HLD,Thyroid disease  Who presents with nausea, vomiting, abdominal pain.  Patient states that her abdominal pain started 3 days ago. It is located in the central abdomen, constant, 10 out of 10 in severity, sharp, radiating to the back. It is not aggravated or admitted any factors. It is associated with nausea and vomiting. She vomited 2-4 times each day. No diarrhea. No hematochezia or hematemesis. Denies symptoms of UTI. She has mild shortness of breath on exertion, no cough or chest pain. Denies fever or chills. . No unilateral weakness.Patient states that she drinks beer, approximately 3 beers each day.  ED Course: pt was found to have Lipase 1875, abnormal liver functions with AST 168, ALT 136, total bilirubin 2.4 and ALP 84, potassium of 5.4, TG 370, bicarbonate less than 7, acute renal injury with creatinine 2.96, blood sugar 249, ABG with pH of 7.003, PCO2 24, PO2 30. WBC 9.4, lactate acid 1.89, UDS positive for opiates, negative pregnancy test, Tylenol level less than 10, salicylate level less than 7, negative urinalysis for UTI, but positive for ketone, temperature normal, tachycardia, oxygen saturation 85 earlier, which is 100% on room air now. Patient is admitted to stepdown bed as inpatient.    Subjective: 7/1 A/O 0 patient obtunded. With vigorous sternal rub will wake up and mumble unintelligibly. Withdraws to painful stimuli. Patient did mumble she drank 3 beers a day unable to determine size of beers. Unable to determine if she drinks other alcoholic beverages.     Assessment & Plan:   Principal Problem:   Acute pancreatitis Active Problems:   Hypertension   Smoking   DKA (diabetic ketoacidoses) ([place])   AKI (acute kidney injury) ([place])   Hyperkalemia   Alcohol use   Metabolic acidosis   Acute  respiratory failure with hypoxia (H[facility])   Liver lesion   HLD (hyperlipidemia)   Altered mental status -Multifactorial to include acute liver failure, acute pancreatitis, metabolic acidosis (DKA, CHF, infection?), EtOH withdrawal? -Monitor ammonia level -Lactulose 30 g  TID via NG tube -0.45% saline 14ml/hr  EtOH abuse -6/30 UDS positive opiates -6/30 negative EtOH -Continue CIWA  Acute pancreatitis:  Recent Labs Lab 07/09/16 1246 07/10/16 0755  LIPASE 1,875* 2,009*  -Most likely secondary to patient's alcoholism, but may also have a factor of acalculous cholecystitis. -CT scan with sludge/stones see results below   Metabolic acidosis possible due to DKA (diabetic ketoacidoses):  -Off glucose stabilizer -Hemoglobin A1c pending -Lipid panel pending -Echocardiogram pending  Essential HTN:  -Hold lisinopril due to AKI and pancreatis -Metoprolol IV 5 mg TID -IV hydralazine PRN  Hyperkalemia: K=5.4. -Resolved  Acute renal failure  -Likely due to prerenal secondary to dehydration and continuation of ACEI and NSIADs(Hold lisinopril and Ibuprofen) - IVF as above - Check FeNa - Follow up renal function by BMP  Tobacco abuse and Alcohol use: -Did counseling about importance of quitting smoking -Nicotine patch -Did counseling about the importance of quitting drinking -CIWA protocol  Nonspecific liver lesion:  -CT scan, ratty infiltration of liver with a nonspecific 13 x 10 mm lesion in the RIGHT lobe. -MRI abdomen liver protocol pending   Acute respiratory failure with hypoxia ([place]):  -This titrate O2 to maintain SPO2 89-93% -Pt is at risk of developing ARDS. -PCXR on 7/2  HLD: LDL 75 on 01/12/16 and TG 370  today -hold fenofibrate     DVT prophylaxis: Lovenox Code Status: Full Family Communication: None Disposition Plan: TBD   Consultants:  None    Procedures/Significant Events:  6/30 CT-renal stone study showed:- Acute pancreatitis without  peripancreatic fluid collection or pancreatic mass. - Fatty infiltration of liver with a nonspecific 13 x 10 mm lesion in the RIGHT lobe. -Intermediate attenuation material within gallbladder sludge or stones?  -Probable adrenal adenoma.- Small uterine leiomyoma.-Umbilical hernia containing fat. 6/30 transvaginal US  -Negative ovarian torsion,-Pedunculated 2.5 cm subserosal fibroid directed to the left from the anterior fundus. Smaller 9 mm intramural fibroid at the posterior body. 6/30 [place] RUQ abdomen:-Negative for gallstones or biliary dilatation. -Coarse heterogeneous echogenic liver consistent with fatty infiltration. -Right hepatic lobe lesion is not well seen by ultrasound.   VENTILATOR SETTINGS: None   Cultures 7/1 at pending 7/1 urine pending   Antimicrobials: Anti-infectives    None       Devices    LINES / TUBES:   Continuous Infusions: . sodium chloride 999 mL/hr at 07/10/16 0204  . dextrose 5 % and 0.45% NaCl 150 mL/hr at 07/10/16 0700  . insulin (NOVOLIN-R) infusion 4.2 mL/hr at 07/10/16 0700     Objective: Vitals:   07/09/16 2044 07/09/16 2138 07/10/16 0400 07/10/16 0433  BP:   (!) 154/94 (!) 154/94  Pulse: (!) 121 (!) 125    Resp:   18   Temp:   98.6 F (37 C)   TempSrc:   Oral   SpO2:  100%    Weight:      Height:        Intake/Output Summary (Last 24 hours) at 07/10/16 0747 Last data filed at 07/10/16 0700  Gross per 24 hour  Intake          2390.07 ml  Output              500 ml  Net          1890.07 ml   Filed Weights   07/09/16 1213  Weight: 181 lb (82.1 kg)    Examination:  General:A/O 0 patient obtunded. With vigorous sternal rub will wake up and mumble unintelligibly, No acute respiratory distress Eyes: negative scleral hemorrhage, negative anisocoria, negative icterus ENT: Negative Runny nose, negative gingival bleeding, Neck:  Negative scars, masses, torticollis, lymphadenopathy, JVD Lungs: tachypnea, Clear to  auscultation bilaterally without wheezes or crackles Cardiovascular: Tachycardic, Regular rhythm without murmur gallop or rub normal S1 and S2 Abdomen: Positive abdominal pain epigastric/LUQ, positive distention, positive soft, bowel sounds, no rebound, no ascites, no appreciable mass Extremities: No significant cyanosis, clubbing, or edema bilateral lower extremities Skin: Negative rashes, lesions, ulcers Psychiatric:  Unable to evaluate secondary to altered mental status  Central nervous system:  Patient would withdraw to painful stimuli, unable to complete evaluation secondary to altered mental status   .     Data Reviewed: Care during the described time interval was provided by me .  I have reviewed this patient's available data, including medical history, events of note, physical examination, and all test results as part of my evaluation. I have personally reviewed and interpreted all radiology studies.  CBC:  Recent Labs Lab 07/09/16 1246  WBC 9.4  NEUTROABS 7.4  HGB 16.9*  HCT 49.5*  MCV 109.8*  PLT 250   Basic Metabolic Panel:  Recent Labs Lab 07/09/16 1246 07/09/16 2122 07/10/16 0332  NA 138 139 140  K 5.4* 5.9* 5.4*  CL 101 110 115*  CO2 <  7* 7* 8*  GLUCOSE 249* 207* 174*  BUN 15 14 14   CREATININE 2.96* 2.72* 2.19*  CALCIUM 9.9 8.3* 8.5*   GFR: Estimated Creatinine Clearance: 37.4 mL/min (A) (by C-G formula based on SCr of 2.19 mg/dL (H)). Liver Function Tests:  Recent Labs Lab 07/09/16 1246  AST 168*  ALT 136*  ALKPHOS 84  BILITOT 2.4*  PROT 10.1*  ALBUMIN 5.6*    Recent Labs Lab 07/09/16 1246  LIPASE 1,875*   No results for input(s): AMMONIA in the last 168 hours. Coagulation Profile: No results for input(s): INR, PROTIME in the last 168 hours. Cardiac Enzymes: No results for input(s): CKTOTAL, CKMB, CKMBINDEX, TROPONINI in the last 168 hours. BNP (last 3 results) No results for input(s): PROBNP in the last 8760 hours. HbA1C: No results  for input(s): HGBA1C in the last 72 hours. CBG:  Recent Labs Lab 07/10/16 0202 07/10/16 0310 07/10/16 0423 07/10/16 0539 07/10/16 0615  GLUCAP 156* 168* 179* 177* 164*   Lipid Profile:  Recent Labs  07/09/16 1246  TRIG 370*   Thyroid Function Tests: No results for input(s): TSH, T4TOTAL, FREET4, T3FREE, THYROIDAB in the last 72 hours. Anemia Panel: No results for input(s): VITAMINB12, FOLATE, FERRITIN, TIBC, IRON, RETICCTPCT in the last 72 hours. Urine analysis:    Component Value Date/Time   COLORURINE AMBER (A) 07/09/2016 1533   APPEARANCEUR TURBID (A) 07/09/2016 1533   LABSPEC 1.018 07/09/2016 1533   PHURINE 5.0 07/09/2016 1533   GLUCOSEU NEGATIVE 07/09/2016 1533   HGBUR MODERATE (A) 07/09/2016 1533   BILIRUBINUR LARGE (A) 07/09/2016 1533   BILIRUBINUR neg 02/02/2015 1012   KETONESUR >80 (A) 07/09/2016 1533   PROTEINUR >300 (A) 07/09/2016 1533   UROBILINOGEN 0.2 02/02/2015 1012   UROBILINOGEN 0.2 04/19/2012 1206   NITRITE NEGATIVE 07/09/2016 1533   LEUKOCYTESUR NEGATIVE 07/09/2016 1533   Sepsis Labs: @LABRCNTIP (procalcitonin:4,lacticidven:4)  )No results found for this or any previous visit (from the past 240 hour(s)).       Radiology Studies: Dg Chest 2 View  Result Date: 07/09/2016 CLINICAL DATA:  Admitted for acute pancreatitis with shortness of breath and oxygen desaturation. EXAM: CHEST  2 VIEW COMPARISON:  05/29/2015 chest radiograph. FINDINGS: Stable heart size and mediastinal contours are within normal limits. Both lungs are clear. The visualized skeletal structures are unremarkable. IMPRESSION: No acute pulmonary process identified. Electronically Signed   By: Kristine Garbe M.D.   On: 07/09/2016 22:09   US Transvaginal Non-ob  Result Date: 07/09/2016 CLINICAL DATA:  41 year old female with left lower quadrant and suprapubic pain for 3-4 days. Negative serum pregnancy test today. Premenopausal. LMP 5 weeks ago. EXAM: TRANSABDOMINAL AND  TRANSVAGINAL ULTRASOUND OF PELVIS DOPPLER ULTRASOUND OF OVARIES TECHNIQUE: Both transabdominal and transvaginal ultrasound examinations of the pelvis were performed. Transabdominal technique was performed for global imaging of the pelvis including uterus, ovaries, adnexal regions, and pelvic cul-de-sac. It was necessary to proceed with endovaginal exam following the transabdominal exam to visualize the ovaries. Color and duplex Doppler ultrasound was utilized to evaluate blood flow to the ovaries. COMPARISON:  Ob ultrasound 11/03/2009. FINDINGS: Uterus Measurements: 6.9 x 3.4 x 4.4 cm. Small intramural 9 mm fibroid at the posterior body of the uterus (image 59). Larger 2.5 cm pedunculated subserosal fibroid at the anterior fundus directed toward the left (image 63). There is some vascularity detected within this fibroid (images 65 and 79). Endometrium Thickness: 4 mm. No focal abnormality visualized. Incidental nabothian cysts at the cervix. Right ovary Only visible trans abdominally. Measurements: 2.5  x 2.8 x 2.2 cm. Simple appearing 19 mm cyst associated with the right ovary (image 26). Left ovary Only visible trans abdominally. Measurements: 3.0 x 2.0 x 1.6 cm. Normal appearance/no adnexal mass. Pulsed Doppler evaluation of both ovaries was difficult to the ovaries could not be visualized with transvaginal technique. Color Doppler flow is evident in both ovaries (right image 19 and left image 31) but spectral imaging could not be obtained. Other findings No abnormal free fluid. IMPRESSION: 1. No strong evidence of ovarian torsion, but Doppler evaluation of both ovaries was difficult and suboptimal. 2. Pedunculated 2.5 cm subserosal fibroid directed to the left from the anterior fundus. Smaller 9 mm intramural fibroid at the posterior body. 3. No pelvic free fluid. Electronically Signed   By: Genevie Ann M.D.   On: 07/09/2016 15:31   US Pelvis Complete  Result Date: 07/09/2016 CLINICAL DATA:  41 year old female  with left lower quadrant and suprapubic pain for 3-4 days. Negative serum pregnancy test today. Premenopausal. LMP 5 weeks ago. EXAM: TRANSABDOMINAL AND TRANSVAGINAL ULTRASOUND OF PELVIS DOPPLER ULTRASOUND OF OVARIES TECHNIQUE: Both transabdominal and transvaginal ultrasound examinations of the pelvis were performed. Transabdominal technique was performed for global imaging of the pelvis including uterus, ovaries, adnexal regions, and pelvic cul-de-sac. It was necessary to proceed with endovaginal exam following the transabdominal exam to visualize the ovaries. Color and duplex Doppler ultrasound was utilized to evaluate blood flow to the ovaries. COMPARISON:  Ob ultrasound 11/03/2009. FINDINGS: Uterus Measurements: 6.9 x 3.4 x 4.4 cm. Small intramural 9 mm fibroid at the posterior body of the uterus (image 59). Larger 2.5 cm pedunculated subserosal fibroid at the anterior fundus directed toward the left (image 63). There is some vascularity detected within this fibroid (images 65 and 79). Endometrium Thickness: 4 mm. No focal abnormality visualized. Incidental nabothian cysts at the cervix. Right ovary Only visible trans abdominally. Measurements: 2.5 x 2.8 x 2.2 cm. Simple appearing 19 mm cyst associated with the right ovary (image 26). Left ovary Only visible trans abdominally. Measurements: 3.0 x 2.0 x 1.6 cm. Normal appearance/no adnexal mass. Pulsed Doppler evaluation of both ovaries was difficult to the ovaries could not be visualized with transvaginal technique. Color Doppler flow is evident in both ovaries (right image 19 and left image 31) but spectral imaging could not be obtained. Other findings No abnormal free fluid. IMPRESSION: 1. No strong evidence of ovarian torsion, but Doppler evaluation of both ovaries was difficult and suboptimal. 2. Pedunculated 2.5 cm subserosal fibroid directed to the left from the anterior fundus. Smaller 9 mm intramural fibroid at the posterior body. 3. No pelvic free  fluid. Electronically Signed   By: Genevie Ann M.D.   On: 07/09/2016 15:31   [place] Art/ven Flow Abd Pelv Doppler  Result Date: 07/09/2016 CLINICAL DATA:  41 year old female with left lower quadrant and suprapubic pain for 3-4 days. Negative serum pregnancy test today. Premenopausal. LMP 5 weeks ago. EXAM: TRANSABDOMINAL AND TRANSVAGINAL ULTRASOUND OF PELVIS DOPPLER ULTRASOUND OF OVARIES TECHNIQUE: Both transabdominal and transvaginal ultrasound examinations of the pelvis were performed. Transabdominal technique was performed for global imaging of the pelvis including uterus, ovaries, adnexal regions, and pelvic cul-de-sac. It was necessary to proceed with endovaginal exam following the transabdominal exam to visualize the ovaries. Color and duplex Doppler ultrasound was utilized to evaluate blood flow to the ovaries. COMPARISON:  Ob ultrasound 11/03/2009. FINDINGS: Uterus Measurements: 6.9 x 3.4 x 4.4 cm. Small intramural 9 mm fibroid at the posterior body of the uterus (image  59). Larger 2.5 cm pedunculated subserosal fibroid at the anterior fundus directed toward the left (image 63). There is some vascularity detected within this fibroid (images 65 and 79). Endometrium Thickness: 4 mm. No focal abnormality visualized. Incidental nabothian cysts at the cervix. Right ovary Only visible trans abdominally. Measurements: 2.5 x 2.8 x 2.2 cm. Simple appearing 19 mm cyst associated with the right ovary (image 26). Left ovary Only visible trans abdominally. Measurements: 3.0 x 2.0 x 1.6 cm. Normal appearance/no adnexal mass. Pulsed Doppler evaluation of both ovaries was difficult to the ovaries could not be visualized with transvaginal technique. Color Doppler flow is evident in both ovaries (right image 19 and left image 31) but spectral imaging could not be obtained. Other findings No abnormal free fluid. IMPRESSION: 1. No strong evidence of ovarian torsion, but Doppler evaluation of both ovaries was difficult and  suboptimal. 2. Pedunculated 2.5 cm subserosal fibroid directed to the left from the anterior fundus. Smaller 9 mm intramural fibroid at the posterior body. 3. No pelvic free fluid. Electronically Signed   By: Genevie Ann M.D.   On: 07/09/2016 15:31   Ct Renal Stone Study  Result Date: 07/09/2016 CLINICAL DATA:  Nausea, vomiting, and diarrhea for 3 days, elevated lipase, acute kidney injury, elevated bilirubin, history hypertension EXAM: CT ABDOMEN AND PELVIS WITHOUT CONTRAST TECHNIQUE: Multidetector CT imaging of the abdomen and pelvis was performed following the standard protocol without IV contrast. Sagittal and coronal MPR images reconstructed from axial data set. Oral contrast was not administered. COMPARISON:  None FINDINGS: Lower chest: Lung bases clear Hepatobiliary: Diffuse fatty infiltration of liver. Nonspecific low-attenuation focus posterior RIGHT lobe liver image 26, 13 x 10 mm. Intermediate attenuation material within gallbladder which could represent sludge or numerous tiny calculi. No biliary dilatation or additional hepatic abnormalities Pancreas: Pancreas is enlarged with poorly defined peripancreatic planes compatible with acute pancreatitis. No discrete pancreatic mass or fluid collection. Spleen: Normal appearance Adrenals/Urinary Tract: Thickening of both adrenal glands with question mass at LEFT adrenal gland 19 x 14 mm image 20. Kidneys, ureters, and bladder normal appearance. Stomach/Bowel: Normal appendix. Stomach and bowel loops normal appearance Vascular/Lymphatic: Aorta normal caliber with atherosclerotic calcification at bifurcation. No adenopathy. Few scattered pelvic phleboliths. Reproductive: Exophytic leiomyoma at LEFT uterus, 2.7 x 1.9 x 1.7 cm. Uterus and adnexa otherwise unremarkable. Other: Umbilical hernia containing fat.  No free air or free fluid. Musculoskeletal: No acute osseous findings. IMPRESSION: Acute pancreatitis without peripancreatic fluid collection or pancreatic  mass. Fatty infiltration of liver with a nonspecific 13 x 10 mm lesion in the RIGHT lobe. Intermediate attenuation material within gallbladder question sludge or stones, could potentially represent vicarious excretion of contrast material if patient has had recent contrast administration. 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 Small uterine leiomyoma. Umbilical hernia containing fat. Electronically Signed   By: Lavonia Dana M.D.   On: 07/09/2016 16:31   US Abdomen Limited Ruq  Result Date: 07/09/2016 CLINICAL DATA:  Pancreatitis EXAM: ULTRASOUND ABDOMEN LIMITED RIGHT UPPER QUADRANT COMPARISON:  07/09/2016 FINDINGS: Gallbladder: No gallstones or wall thickening visualized. No sonographic Murphy sign noted by sonographer. Common bile duct: Diameter: 2 mm Liver: Increased echogenicity, heterogenous consistent with fatty infiltration. Unable to visualize the right posterior hepatic lobe CT lesion. Difficult visualization of flow in the portal vessels likely due to fatty infiltration. IMPRESSION: 1. Negative for gallstones or biliary dilatation 2. Coarse heterogeneous echogenic liver consistent with fatty infiltration. Difficult visualization of portal vessels, probably due to fatty infiltration ; follow-up contrast-enhanced CT could be  obtained to confirm patency of the portal vein. 3. Previously described right hepatic lobe lesion is not well seen by ultrasound. Electronically Signed   By: Donavan Foil M.D.   On: 07/09/2016 22:58        Scheduled Meds: . amLODipine  5 mg Oral Daily  . aspirin EC  162 mg Oral Daily  . enoxaparin (LOVENOX) injection  30 mg Subcutaneous Q24H  . folic acid  1 mg Oral Daily  . LORazepam  0-4 mg Intravenous Q6H   Followed by  . [START ON 07/11/2016] LORazepam  0-4 mg Intravenous Q12H  . multivitamin with minerals  1 tablet Oral Daily  . nicotine  21 mg Transdermal Daily  . pantoprazole (PROTONIX) IV  40 mg Intravenous Q24H  . thiamine  100 mg Oral Daily   Or  . thiamine  100 mg  Intravenous Daily   Continuous Infusions: . sodium chloride 999 mL/hr at 07/10/16 0204  . dextrose 5 % and 0.45% NaCl 150 mL/hr at 07/10/16 0700  . insulin (NOVOLIN-R) infusion 4.2 mL/hr at 07/10/16 0700     LOS: 1 day    Time spent: 40 minutes    Antionette Luster, Geraldo Docker, MD [facility] Pager [phone]   If 7PM-7AM, please contact night-coverage [email or web address] Password [facility] 07/10/2016, 7:47 AM

## 2016-07-11 ENCOUNTER — Inpatient Hospital Stay (HOSPITAL_COMMUNITY): Payer: PRIVATE HEALTH INSURANCE

## 2016-07-11 DIAGNOSIS — R160 Hepatomegaly, not elsewhere classified: Secondary | ICD-10-CM

## 2016-07-11 DIAGNOSIS — I360 Nonrheumatic tricuspid (valve) stenosis: Secondary | ICD-10-CM

## 2016-07-11 LAB — LIPASE, BLOOD: Lipase: 843 U/L — ABNORMAL HIGH (ref 11–51)

## 2016-07-11 LAB — HEMOGLOBIN A1C
Hgb A1c MFr Bld: 6.1 % — ABNORMAL HIGH (ref 4.8–5.6)
MEAN PLASMA GLUCOSE: 128 mg/dL

## 2016-07-11 LAB — GLUCOSE, CAPILLARY
GLUCOSE-CAPILLARY: 129 mg/dL — AB (ref 65–99)
GLUCOSE-CAPILLARY: 171 mg/dL — AB (ref 65–99)
GLUCOSE-CAPILLARY: 86 mg/dL (ref 65–99)
Glucose-Capillary: 134 mg/dL — ABNORMAL HIGH (ref 65–99)
Glucose-Capillary: 114 mg/dL — ABNORMAL HIGH (ref 65–99)
Glucose-Capillary: 119 mg/dL — ABNORMAL HIGH (ref 65–99)

## 2016-07-11 LAB — HEPATITIS PANEL, ACUTE
HCV Ab: <0.1 s/co ratio (ref 0.0–0.9)
HEP B C IGM: NEGATIVE
Hep A IgM: NEGATIVE
Hepatitis B Surface Ag: NEGATIVE

## 2016-07-11 LAB — ECHOCARDIOGRAM COMPLETE
HEIGHTINCHES: 71 in
Weight: 2822.4 oz

## 2016-07-11 LAB — COMPREHENSIVE METABOLIC PANEL
ALBUMIN: 3.4 g/dL — AB (ref 3.5–5.0)
ALT: 58 U/L — ABNORMAL HIGH (ref 14–54)
ANION GAP: 13 (ref 5–15)
AST: 60 U/L — ABNORMAL HIGH (ref 15–41)
Alkaline Phosphatase: 48 U/L (ref 38–126)
BUN: 10 mg/dL (ref 6–20)
CO2: 18 mmol/L — AB (ref 22–32)
Calcium: 8.3 mg/dL — ABNORMAL LOW (ref 8.9–10.3)
Chloride: 108 mmol/L (ref 101–111)
Creatinine, Ser: 1.18 mg/dL — ABNORMAL HIGH (ref 0.44–1.00)
GFR calc Af Amer: >60 mL/min (ref >60)
GFR calc non Af Amer: 56 mL/min — ABNORMAL LOW (ref >60)
GLUCOSE: 123 mg/dL — AB (ref 65–99)
POTASSIUM: 2.9 mmol/L — AB (ref 3.5–5.1)
SODIUM: 139 mmol/L (ref 135–145)
Total Bilirubin: 1.9 mg/dL — ABNORMAL HIGH (ref 0.3–1.2)
Total Protein: 6.2 g/dL — ABNORMAL LOW (ref 6.5–8.1)

## 2016-07-11 LAB — MAGNESIUM: Magnesium: 1.6 mg/dL — ABNORMAL LOW (ref 1.7–2.4)

## 2016-07-11 LAB — AMMONIA: Ammonia: 95 umol/L — ABNORMAL HIGH (ref 9–35)

## 2016-07-11 LAB — PROTIME-INR
INR: 1.16
PROTHROMBIN TIME: 14.9 s (ref 11.4–15.2)

## 2016-07-11 MED ORDER — POTASSIUM CHLORIDE 10 MEQ/100ML IV SOLN: 10.0000 meq | INTRAVENOUS | Status: DC

## 2016-07-11 MED ORDER — POTASSIUM CHLORIDE IN NACL 20-0.9 MEQ/L-% IV SOLN
INTRAVENOUS | Status: DC
  Administered 2016-07-11: 75 mL/h via INTRAVENOUS
  Filled 2016-07-11 (×2): qty 1000

## 2016-07-11 MED ORDER — POTASSIUM CHLORIDE 10 MEQ/100ML IV SOLN
10.0000 meq | INTRAVENOUS | Status: AC
  Administered 2016-07-11 (×4): 10 meq via INTRAVENOUS
  Filled 2016-07-11 (×4): qty 100

## 2016-07-11 MED ORDER — BOOST / RESOURCE BREEZE PO LIQD
1.0000 | Freq: Three times a day (TID) | ORAL | Status: DC
  Administered 2016-07-11 – 2016-07-14 (×6): 1 via ORAL

## 2016-07-11 MED ORDER — ASPIRIN EC 81 MG PO TBEC
81.0000 mg | DELAYED_RELEASE_TABLET | Freq: Every day | ORAL | Status: DC
  Administered 2016-07-12 – 2016-07-17 (×6): 81 mg via ORAL
  Filled 2016-07-11 (×6): qty 1

## 2016-07-11 MED ORDER — MAGNESIUM SULFATE 2 GM/50ML IV SOLN
2.0000 g | Freq: Once | INTRAVENOUS | Status: AC
  Administered 2016-07-11: 2 g via INTRAVENOUS
  Filled 2016-07-11: qty 50

## 2016-07-11 NOTE — Progress Notes (Signed)
  Echocardiogram 2D Echocardiogram has been performed.  Terri Morris 07/11/2016, 8:41 AM

## 2016-07-11 NOTE — Progress Notes (Addendum)
Initial Nutrition Assessment  DOCUMENTATION CODES:   Not applicable  INTERVENTION:   -Boost Breeze po TID, each supplement provides 250 kcal and 9 grams of protein -RD will follow for diet advancement and supplement as appropriate   NUTRITION DIAGNOSIS:   Predicted suboptimal nutrient intake related to acute illness (pancreatitis) as evidenced by percent weight loss.  GOAL:   Patient will meet greater than or equal to 90% of their needs  MONITOR:   PO intake, Supplement acceptance, Diet advancement, Labs, Weight trends, Skin, I & O's  REASON FOR ASSESSMENT:   Malnutrition Screening Tool    ASSESSMENT:   Terri Morris is a 41 y.o. female with medical history significant of hypertension, hyperlipidemia, anxiety, tobacco abuse, alcohol use, who presents with nausea, vomiting, abdominal pain.  Pt admitted with nausea, vomiting and abdominal pain related to acute pancreatitis and possible DKA.   Pt sleeping soundly at time of visit. Unable to arouse to obtain hx and no family members present at time of visit.   Wt hx reviewed, pt has experienced a 28# (13.7%) wt loss over the past 6 months, which is significant for time frame.   Unable to complete Nutrition-Focused physical exam at this time.   Medications reviewed and include, folvite, vitamin B-1, and MVI. Pt on insulin therapy (10 units insulin glargine daily and insulin sensitive correction coverage).   Labs reviewed: K: 2.9, Mg: 1.6, CBGS: 114-134. Latest Hgb A1c: 6.1 (07/10/16).   Diet Order:  Diet clear liquid Room service appropriate? Yes; Fluid consistency: Thin  Skin:  Reviewed, no issues  Last BM:  07/11/16  Height:   Ht Readings from Last 1 Encounters:  07/09/16 5\' 11"  (1.803 m)    Weight:   Wt Readings from Last 1 Encounters:  07/10/16 176 lb 6.4 oz (80 kg)    Ideal Body Weight:  70.5 kg  BMI:  Body mass index is 24.6 kg/m.  Estimated Nutritional Needs:   Kcal:  1800-2000  Protein:   105-120 grams  Fluid:  1.8-2.0 L  EDUCATION NEEDS:   No education needs identified at this time  Rollan Roger A. Jimmye Norman, RD, LDN, CDE Pager: [phone] After hours Pager: [phone]

## 2016-07-11 NOTE — Progress Notes (Signed)
[place] TEAM 1 - Stepdown/ICU TEAM  TRACEE MCCREERY  NOB:[id number] DOB: Nov 08, 1975 DOA: 07/09/2016 PCP: Mackie Pai, PA-C    Brief Narrative:  41yo F Hx EtOH abuse, Anxiety, HTN, HLD, and Thyroid disease who presented with 3 days of nausea, vomiting, and abdominal pain.  In the ED she was found to have a Lipase of 1875, AST 168, ALT 136, total bilirubin 2.4 and acute renal injury with creatinine 2.96.   Subjective: The pt is alert and conversant.  She asked appropriate questions about her treatment and test results.  She reports acute severe worsening of her epigastric pain with attempts to consume clear liquids today.  She denies shortness of breath or chest pain.  Assessment & Plan:  Acute alcohol associated pancreatitis Slowly improving, but did poorly w/ attempts at clear liquid diet today - back off to ice chips only and follow  Alcohol abuse No evidence of acute withdrawal at this time - follow   Fatty infiltrate of the liver Noted on ultrasound of the abdomen 6/30 - counseled pt on potential for this to progress to cirrhosis and absolute need to d/c EtOH use entirely   Toxic metabolic encephalopathy Multifactorial to include acute liver failure, acute pancreatitis, metabolic acidosis, EtOH withdrawal - completely resolved w/ pt alert and oriented today - cont lactulose - f/u ammonia in AM   DKA? - ?newly diagnosed DM A1c pending - CBG reasonably controlled at this time   Acute renal failure Creatinine is steadily improving with fine resuscitation  Nonspecific liver lesion Nonspecific 13 x 10 mm lesion in the right lobe noted on CT scan abdomen - no liver lesion noted on MRCP - follow-up liver protocol CT abdomen with contrast suggested in 3-6 months  Acute hypoxic respiratory failure Likely due to obtundation plus acute pancreatitis - now resolved  Tobacco abuse  HTN  HLD  DVT prophylaxis: Lovenox Code Status: FULL CODE Family Communication: no family  present at time of exam  Disposition Plan: SDU   Consultants:  None  Procedures: none  Antimicrobials:  none   Objective: Blood pressure (!) 147/87, pulse (!) 102, temperature 99.2 F (37.3 C), temperature source Axillary, resp. rate 16, height 5\' 11"  (1.803 m), weight 80 kg (176 lb 6.4 oz), last menstrual period 05/18/2016, SpO2 91 %.  Intake/Output Summary (Last 24 hours) at 07/11/16 1536 Last data filed at 07/11/16 0700  Gross per 24 hour  Intake          1175.82 ml  Output              550 ml  Net           625.82 ml   Filed Weights   07/09/16 1213 07/10/16 1406  Weight: 82.1 kg (181 lb) 80 kg (176 lb 6.4 oz)    Examination: General: No acute respiratory distress Lungs: Clear to auscultation bilaterally without wheezes or crackles Cardiovascular: Regular rate and rhythm without murmur gallop or rub normal S1 and S2 Abdomen: Tender to palpation in epigastrium, no rebound, nondistended, soft Extremities: No significant cyanosis, clubbing, or edema bilateral lower extremities  CBC:  Recent Labs Lab 07/09/16 1246  WBC 9.4  NEUTROABS 7.4  HGB 16.9*  HCT 49.5*  MCV 109.8*  PLT 947   Basic Metabolic Panel:  Recent Labs Lab 07/10/16 0728 07/10/16 0831 07/10/16 1307 07/10/16 1424 07/11/16 0239  NA 139 139 143 142 139  K 4.2 4.0 4.1 3.6 2.9*  CL 114* 115* 111 112* 108  CO2  11* 10* 15* 15* 18*  GLUCOSE 184* 154* 173* 195* 123*  BUN 12 12 11 11 10   CREATININE 1.87* 1.73* 1.55* 1.54* 1.18*  CALCIUM 8.5* 8.7* 8.8* 8.6* 8.3*  MG  --   --   --  1.7 1.6*   GFR: Estimated Creatinine Clearance: 69.4 mL/min (A) (by C-G formula based on SCr of 1.18 mg/dL (H)).  Liver Function Tests:  Recent Labs Lab 07/09/16 1246 07/11/16 0239  AST 168* 60*  ALT 136* 58*  ALKPHOS 84 48  BILITOT 2.4* 1.9*  PROT 10.1* 6.2*  ALBUMIN 5.6* 3.4*    Recent Labs Lab 07/09/16 1246 07/10/16 0755 07/11/16 0239  LIPASE 1,875* 2,009* 843*    Recent Labs Lab 07/10/16 1350  07/11/16 0239  AMMONIA 109* 95*    Coagulation Profile:  Recent Labs Lab 07/10/16 2012 07/11/16 0239  INR 1.13 1.16    HbA1C: Hgb A1c MFr Bld  Date/Time Value Ref Range Status  07/10/2016 03:32 AM 6.1 (H) 4.8 - 5.6 % Final    Comment:    (NOTE)         Pre-diabetes: 5.7 - 6.4         Diabetes: >6.4         Glycemic control for adults with diabetes: <7.0   01/19/2015 02:12 PM 5.8 4.6 - 6.5 % Final    Comment:    Glycemic Control Guidelines for People with Diabetes:Non Diabetic:  <6%Goal of Therapy: <7%Additional Action Suggested:  >8%     CBG:  Recent Labs Lab 07/10/16 1921 07/10/16 2322 07/11/16 0356 07/11/16 0744 07/11/16 1142  GLUCAP 155* 120* 134* 114* 119*    Recent Results (from the past 240 hour(s))  MRSA PCR Screening     Status: None   Collection Time: 07/10/16  6:59 AM  Result Value Ref Range Status   MRSA by PCR NEGATIVE NEGATIVE Final    Comment:        The GeneXpert MRSA Assay (FDA approved for NASAL specimens only), is one component of a comprehensive MRSA colonization surveillance program. It is not intended to diagnose MRSA infection nor to guide or monitor treatment for MRSA infections.   Culture, blood (routine x 2)     Status: None (Preliminary result)   Collection Time: 07/10/16  8:12 PM  Result Value Ref Range Status   Specimen Description BLOOD RIGHT ANTECUBITAL  Final   Special Requests   Final    BOTTLES DRAWN AEROBIC AND ANAEROBIC Blood Culture adequate volume   Culture NO GROWTH < 24 HOURS  Final   Report Status PENDING  Incomplete  Culture, blood (routine x 2)     Status: None (Preliminary result)   Collection Time: 07/10/16  8:21 PM  Result Value Ref Range Status   Specimen Description BLOOD LEFT ANTECUBITAL  Final   Special Requests   Final    BOTTLES DRAWN AEROBIC AND ANAEROBIC Blood Culture results may not be optimal due to an excessive volume of blood received in culture bottles   Culture NO GROWTH < 24 HOURS  Final    Report Status PENDING  Incomplete     Scheduled Meds: . amLODipine  5 mg Oral Daily  . aspirin EC  162 mg Oral Daily  . enoxaparin (LOVENOX) injection  40 mg Subcutaneous Q24H  . feeding supplement  1 Container Oral TID BM  . folic acid  1 mg Oral Daily  . insulin aspart  0-9 Units Subcutaneous Q4H  . insulin glargine  10 Units  Subcutaneous Daily  . lactulose  30 g Oral TID  . LORazepam  0-4 mg Intravenous Q6H   Followed by  . LORazepam  0-4 mg Intravenous Q12H  . metoprolol tartrate  5 mg Intravenous Q8H  . multivitamin with minerals  1 tablet Oral Daily  . nicotine  21 mg Transdermal Daily  . pantoprazole (PROTONIX) IV  40 mg Intravenous Q24H  . thiamine  100 mg Oral Daily   Or  . thiamine  100 mg Intravenous Daily     LOS: 2 days   Cherene Altes, MD [facility] Office  [phone] Pager - Text Page per Shea Evans as per below:  On-Call/Text Page:      [email or web address]      password [email or web address]  If 7PM-7AM, please contact night-coverage [email or web address] Password [facility] 07/11/2016, 3:36 PM

## 2016-07-12 DIAGNOSIS — K760 Fatty (change of) liver, not elsewhere classified: Secondary | ICD-10-CM

## 2016-07-12 DIAGNOSIS — E1121 Type 2 diabetes mellitus with diabetic nephropathy: Secondary | ICD-10-CM

## 2016-07-12 DIAGNOSIS — E876 Hypokalemia: Secondary | ICD-10-CM

## 2016-07-12 DIAGNOSIS — I5031 Acute diastolic (congestive) heart failure: Secondary | ICD-10-CM

## 2016-07-12 LAB — COMPREHENSIVE METABOLIC PANEL
ALK PHOS: 45 U/L (ref 38–126)
ALT: 49 U/L (ref 14–54)
AST: 54 U/L — AB (ref 15–41)
Albumin: 3.2 g/dL — ABNORMAL LOW (ref 3.5–5.0)
Anion gap: 10 (ref 5–15)
BILIRUBIN TOTAL: 1.7 mg/dL — AB (ref 0.3–1.2)
CALCIUM: 8.4 mg/dL — AB (ref 8.9–10.3)
CO2: 23 mmol/L (ref 22–32)
CREATININE: 0.75 mg/dL (ref 0.44–1.00)
Chloride: 104 mmol/L (ref 101–111)
GFR calc Af Amer: >60 mL/min (ref >60)
GLUCOSE: 128 mg/dL — AB (ref 65–99)
Potassium: 2.6 mmol/L — CL (ref 3.5–5.1)
Sodium: 137 mmol/L (ref 135–145)
TOTAL PROTEIN: 6 g/dL — AB (ref 6.5–8.1)

## 2016-07-12 LAB — POTASSIUM
Potassium: 2.6 mmol/L — CL (ref 3.5–5.1)
Potassium: 3.2 mmol/L — ABNORMAL LOW (ref 3.5–5.1)

## 2016-07-12 LAB — URINE CULTURE

## 2016-07-12 LAB — LIPASE, BLOOD: Lipase: 324 U/L — ABNORMAL HIGH (ref 11–51)

## 2016-07-12 LAB — GLUCOSE, CAPILLARY
GLUCOSE-CAPILLARY: 128 mg/dL — AB (ref 65–99)
GLUCOSE-CAPILLARY: 188 mg/dL — AB (ref 65–99)
GLUCOSE-CAPILLARY: 112 mg/dL — AB (ref 65–99)
Glucose-Capillary: 114 mg/dL — ABNORMAL HIGH (ref 65–99)
Glucose-Capillary: 115 mg/dL — ABNORMAL HIGH (ref 65–99)
Glucose-Capillary: 170 mg/dL — ABNORMAL HIGH (ref 65–99)

## 2016-07-12 LAB — HEMOGLOBIN A1C
Hgb A1c MFr Bld: 6.1 % — ABNORMAL HIGH (ref 4.8–5.6)
Mean Plasma Glucose: 128 mg/dL

## 2016-07-12 LAB — PROTIME-INR
INR: 1.25
Prothrombin Time: 15.7 seconds — ABNORMAL HIGH (ref 11.4–15.2)

## 2016-07-12 LAB — PHOSPHORUS: Phosphorus: 1.1 mg/dL — ABNORMAL LOW (ref 2.5–4.6)

## 2016-07-12 LAB — MAGNESIUM
MAGNESIUM: 2 mg/dL (ref 1.7–2.4)
Magnesium: 1.8 mg/dL (ref 1.7–2.4)

## 2016-07-12 LAB — BRAIN NATRIURETIC PEPTIDE: B Natriuretic Peptide: 84.7 pg/mL (ref 0.0–100.0)

## 2016-07-12 LAB — AMMONIA: AMMONIA: 21 umol/L (ref 9–35)

## 2016-07-12 MED ORDER — POTASSIUM CHLORIDE CRYS ER 20 MEQ PO TBCR
40.0000 meq | EXTENDED_RELEASE_TABLET | Freq: Once | ORAL | Status: AC
  Administered 2016-07-12: 40 meq via ORAL
  Filled 2016-07-12: qty 2

## 2016-07-12 MED ORDER — LACTULOSE 10 GM/15ML PO SOLN
30.0000 g | Freq: Two times a day (BID) | ORAL | Status: DC
  Administered 2016-07-12 – 2016-07-17 (×9): 30 g via ORAL
  Filled 2016-07-12 (×10): qty 45

## 2016-07-12 MED ORDER — POTASSIUM CHLORIDE CRYS ER 20 MEQ PO TBCR
30.0000 meq | EXTENDED_RELEASE_TABLET | Freq: Four times a day (QID) | ORAL | Status: AC
  Administered 2016-07-12 – 2016-07-13 (×2): 30 meq via ORAL
  Filled 2016-07-12 (×2): qty 1

## 2016-07-12 MED ORDER — AMLODIPINE BESYLATE 5 MG PO TABS: 5.0000 mg | ORAL_TABLET | Freq: Every day | ORAL | Status: DC

## 2016-07-12 MED ORDER — FUROSEMIDE 10 MG/ML IJ SOLN
40.0000 mg | Freq: Once | INTRAMUSCULAR | Status: AC
  Administered 2016-07-12: 40 mg via INTRAVENOUS
  Filled 2016-07-12: qty 4

## 2016-07-12 MED ORDER — LIVING WELL WITH DIABETES BOOK
Freq: Once | Status: AC
Start: 2016-07-12 — End: 2016-07-12
  Administered 2016-07-12: 13:00:00
  Filled 2016-07-12: qty 1

## 2016-07-12 MED ORDER — LISINOPRIL 20 MG PO TABS
20.0000 mg | ORAL_TABLET | Freq: Every day | ORAL | Status: DC
  Administered 2016-07-12 – 2016-07-17 (×6): 20 mg via ORAL
  Filled 2016-07-12 (×6): qty 1

## 2016-07-12 MED ORDER — POTASSIUM CHLORIDE 10 MEQ/100ML IV SOLN
10.0000 meq | INTRAVENOUS | Status: AC
  Administered 2016-07-12 (×4): 10 meq via INTRAVENOUS
  Filled 2016-07-12 (×4): qty 100

## 2016-07-12 MED ORDER — INSULIN STARTER KIT- PEN NEEDLES (ENGLISH)
1.0000 | Freq: Once | Status: AC
  Administered 2016-07-12: 1
  Filled 2016-07-12 (×2): qty 1

## 2016-07-12 NOTE — Progress Notes (Signed)
Spoke with patient regarding likely need for insulin at d/c.  She states that she began administering insulin to her mother when she was 61 and is familiar with diabetes management.  We discussed hypoglycemia signs, symptoms and treatment and patient was able to teach back.  We discussed her A1C of 6.1% which indicates that that blood sugars have not been high for the past 2-3 months.  She will need to monitor very closely to assess for elevations or drops in blood sugars.  Explained that as pancreatitis improves, patient may not need insulin if blood sugars improve?  Encouraged her to follow-up with her PCP closely as well. Discussed hypoglycemia, hyperglycemia, monitoring, and importance of follow-up with PCP.  Patient verbalized understanding.  She states that she feels overwhelmed with all the new information.  She has both insulin starter kit and Living well with diabetes booklet at bedside.  She is interested in insulin pen for insulin delivery at d/c.  Patient will need Rx. For glucose meter/strips ([id number]), insulin pen needles ([id number]), and insulin pen (Lantus solostar) at d/c for diabetes.   Will follow.  Thanks, Adah Perl, RN, BC-ADM Inpatient Diabetes Coordinator Pager [phone] (8a-5p)

## 2016-07-12 NOTE — Progress Notes (Signed)
CRITICAL VALUE ALERT  Critical Value:  Potassium 2.6  Date & Time Notied:  07/12/2016 0440  Provider Notified: Tylene Fantasia, NP  Orders Received/Actions taken: orders to give patient IV potassium x4 and recheck at 1 pm.  Milford Cage, RN

## 2016-07-12 NOTE — Progress Notes (Signed)
Explained and demonstrated to patient how to administer SQ insulin injection. Patient was able to return demonstrate a SQ insulin administration and was able to give herself her 1600 dose of insulin. Patient tolerated well and stated she did not have questions about SQ insulin administration at this time. Will continue to monitor patient.

## 2016-07-12 NOTE — Progress Notes (Signed)
CRITICAL VALUE ALERT  Critical Value:  Potassium 2.6  Date & Time Notied:  07/12/16 @ 1400  Provider Notified: Sherral Hammers  Orders Received/Actions taken:  Orders to re-draw potassium level at 1800, after patient receives potassium dose.

## 2016-07-12 NOTE — Progress Notes (Signed)
PROGRESS NOTE    Terri BITTEL  DHR:[id number] DOB: August 05, 1975 DOA: 07/09/2016 PCP: Mackie Pai, PA-C   Brief Narrative:   41 y.o. BF PMHx EtOH abuse, Anxiety, HTN, HLD,Thyroid disease  Who presents with nausea, vomiting, abdominal pain.  Patient states that her abdominal pain started 3 days ago. It is located in the central abdomen, constant, 10 out of 10 in severity, sharp, radiating to the back. It is not aggravated or admitted any factors. It is associated with nausea and vomiting. She vomited 2-4 times each day. No diarrhea. No hematochezia or hematemesis. Denies symptoms of UTI. She has mild shortness of breath on exertion, no cough or chest pain. Denies fever or chills. . No unilateral weakness.Patient states that she drinks beer, approximately 3 beers each day.  ED Course: pt was found to have Lipase 1875, abnormal liver functions with AST 168, ALT 136, total bilirubin 2.4 and ALP 84, potassium of 5.4, TG 370, bicarbonate less than 7, acute renal injury with creatinine 2.96, blood sugar 249, ABG with pH of 7.003, PCO2 24, PO2 30. WBC 9.4, lactate acid 1.89, UDS positive for opiates, negative pregnancy test, Tylenol level less than 10, salicylate level less than 7, negative urinalysis for UTI, but positive for ketone, temperature normal, tachycardia, oxygen saturation 85 earlier, which is 100% on room air now. Patient is admitted to stepdown bed as inpatient.    Subjective: 7/3 A/O 4 , negative CP, negative SOB, positive abdominal pain, negative N/V      Assessment & Plan:   Principal Problem:   Acute pancreatitis Active Problems:   Hypertension   Smoking   DKA (diabetic ketoacidoses) ([place])   AKI (acute kidney injury) ([place])   Hyperkalemia   Alcohol use   Metabolic acidosis   Acute respiratory failure with hypoxia ([facility])   Liver lesion   HLD (hyperlipidemia)   Altered mental status -Multifactorial to include acute liver failure, acute pancreatitis, metabolic  acidosis (DKA, CHF, infection?), EtOH withdrawal? -Monitor ammonia level Recent Labs Lab 07/10/16 1350 07/11/16 0239 07/12/16 0317  AMMONIA 109* 95* 21  -7/3 decrease Lactulose 30 g  BID  - 0.9% saline 75 ml/hr -Resolved  EtOH abuse -6/30 UDS positive opiates -6/30 negative EtOH -Continue CIWA   EtOH induced Acute pancreatitis:  Recent Labs Lab 07/09/16 1246 07/10/16 0755 07/11/16 0239 07/12/16 0317  LIPASE 1,875* 2,009* 843* 324*  -Most likely secondary to patient's alcoholism, but may also have a factor of acalculous cholecystitis. 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 Would not start patient also funguria secondary to acute liver failure.  Acute Diastolic CHF -Strict in and out since admission +6.9 L -Daily weight Filed Weights   07/09/16 1213 07/10/16 1406 07/12/16 0400  Weight: 181 lb (82.1 kg) 176 lb 6.4 oz (80 kg) 180 lb (81.6 kg)   Essential HTN:  -Hold lisinopril due to AKI and pancreatis -Metoprolol IV 5 mg TID -IV hydralazine PRN  Hypokalemia -Potassium goal> 4 -Potassium 40 mEq -Recheck K/Mg at 1500  Acute renal failure  -Likely due to prerenal secondary to dehydration and continuation of ACEI and NSIADs(Hold lisinopril and Ibuprofen) - IVF as above - Check FeNa Lab Results  Component Value Date   CREATININE 0.75 07/12/2016   CREATININE 1.18 (H) 07/11/2016   CREATININE 1.54 (H) 07/10/2016  -Improved with hydration  Tobacco  abuse and Alcohol use: -Did counseling about importance of quitting smoking -Nicotine patch -Did counseling about the importance of quitting drinking -CIWA protocol  Severe Fatty Liver disease/Nonspecific liver lesion:  -CT scan, ratty infiltration of liver with a nonspecific 13 x 10 mm lesion in the RIGHT lobe. -MRI  abdomen liver protocol; did not show liver lesion but showed fatty liver see results below  -Patient will need follow-up abdominal CT in 3-6 months as outpatient -Acute hepatitis panel negative  Acute respiratory failure with hypoxia ([place]):  -This titrate O2 to maintain SPO2 89-93% -Pt is at risk of developing ARDS. -PCXR on 7/2  HLD:  -LDL 75 on 01/12/16 and TG 370 today -hold fenofibrate     DVT prophylaxis: Lovenox Code Status: Full Family Communication: None Disposition Plan: TBD   Consultants:  None    Procedures/Significant Events:  6/30 CT-renal stone study showed:- Acute pancreatitis without peripancreatic fluid collection or pancreatic mass. - Fatty infiltration of liver with a nonspecific 13 x 10 mm lesion in the RIGHT lobe. -Intermediate attenuation material within gallbladder sludge or stones?  -Probable adrenal adenoma.- Small uterine leiomyoma.-Umbilical hernia containing fat. 6/30 transvaginal US  -Negative ovarian torsion,-Pedunculated 2.5 cm subserosal fibroid directed to the left from the anterior fundus. Smaller 9 mm intramural fibroid at the posterior body. 6/30 [place] RUQ abdomen:-Negative for gallstones or biliary dilatation. -Coarse heterogeneous echogenic liver consistent with fatty infiltration. -Right hepatic lobe lesion is not well seen by ultrasound. 6/2 Echocardiogram: LVEF=:65% to 70%. -(grade 2 diastolic dysfunction). 7/1 MRI abdomen W/Wo contrast:-Focal hepatic lesion on CT is not evident on MRI, although this may be related to technical limitations. Regardless, this is not considered concerning for hepatocellular carcinoma  -CT abdomen with/without contrast in 3-6 months for further characterization. -Underlying severe hepatic steatosis. -1.8 cm left adrenal nodule, favoring a benign adrenal adenoma. -Acute uncomplicated pancreatitis.    VENTILATOR SETTINGS: None   Cultures 7/1 blood NGTD 7/1 urine positive multiple  species   Antimicrobials: Anti-infectives    None       Devices    LINES / TUBES:   Continuous Infusions: . 0.9 % NaCl with KCl 20 mEq / L 75 mL/hr (07/11/16 1751)  . potassium chloride 10 mEq (07/12/16 0631)     Objective: Vitals:   07/12/16 0400 07/12/16 0500 07/12/16 0600 07/12/16 0731  BP: (!) 143/86 139/74 131/86 (!) 142/104  Pulse: 82 86 76 90  Resp: 15 16 14 13   Temp: 98.4 F (36.9 C)   98.3 F (36.8 C)  TempSrc: Oral   Oral  SpO2: 98% 98% 98% 99%  Weight: 180 lb (81.6 kg)     Height:        Intake/Output Summary (Last 24 hours) at 07/12/16 0745 Last data filed at 07/12/16 0630  Gross per 24 hour  Intake           3642.5 ml  Output                0 ml  Net           3642.5 ml   Filed Weights   07/09/16 1213 07/10/16 1406 07/12/16 0400  Weight: 181 lb (82.1 kg) 176 lb 6.4 oz (80 kg) 180 lb (81.6 kg)    Examination:  General:A/O 4,No acute respiratory distress Eyes: negative scleral hemorrhage, negative anisocoria, negative icterus ENT: Negative Runny nose, negative gingival bleeding, Neck:  Negative scars, masses, torticollis, lymphadenopathy, JVD Lungs: tachypnea, Clear to auscultation bilaterally without wheezes or crackles Cardiovascular:  Tachycardic, Regular rhythm without murmur gallop or rub normal S1 and S2 Abdomen: Positive abdominal pain epigastric/LUQ, positive distention, positive soft, bowel sounds, no rebound, no ascites, no appreciable mass Extremities: No significant cyanosis, clubbing, or edema bilateral lower extremities Skin: Negative rashes, lesions, ulcers Psychiatric:  Negative depression, negative anxiety, negative fatigue, negative mania  Central nervous system:  Cranial nerves II through XII intact, tongue/uvula midline, all extremities muscle strength 5/5, sensation intact throughout,  negative dysarthria, negative expressive aphasia, negative receptive aphasia.  .     Data Reviewed: Care during the described time  interval was provided by me .  I have reviewed this patient's available data, including medical history, events of note, physical examination, and all test results as part of my evaluation. I have personally reviewed and interpreted all radiology studies.  CBC:  Recent Labs Lab 07/09/16 1246  WBC 9.4  NEUTROABS 7.4  HGB 16.9*  HCT 49.5*  MCV 109.8*  PLT 053   Basic Metabolic Panel:  Recent Labs Lab 07/10/16 0831 07/10/16 1307 07/10/16 1424 07/11/16 0239 07/12/16 0317  NA 139 143 142 139 137  K 4.0 4.1 3.6 2.9* 2.6*  CL 115* 111 112* 108 104  CO2 10* 15* 15* 18* 23  GLUCOSE 154* 173* 195* 123* 128*  BUN 12 11 11 10  <5*  CREATININE 1.73* 1.55* 1.54* 1.18* 0.75  CALCIUM 8.7* 8.8* 8.6* 8.3* 8.4*  MG  --   --  1.7 1.6* 2.0  PHOS  --   --   --   --  1.1*   GFR: Estimated Creatinine Clearance: 102.4 mL/min (by C-G formula based on SCr of 0.75 mg/dL). Liver Function Tests:  Recent Labs Lab 07/09/16 1246 07/11/16 0239 07/12/16 0317  AST 168* 60* 54*  ALT 136* 58* 49  ALKPHOS 84 48 45  BILITOT 2.4* 1.9* 1.7*  PROT 10.1* 6.2* 6.0*  ALBUMIN 5.6* 3.4* 3.2*    Recent Labs Lab 07/09/16 1246 07/10/16 0755 07/11/16 0239 07/12/16 0317  LIPASE 1,875* 2,009* 843* 324*    Recent Labs Lab 07/10/16 1350 07/11/16 0239 07/12/16 0317  AMMONIA 109* 95* 21   Coagulation Profile:  Recent Labs Lab 07/10/16 2012 07/11/16 0239 07/12/16 0317  INR 1.13 1.16 1.25   Cardiac Enzymes: No results for input(s): CKTOTAL, CKMB, CKMBINDEX, TROPONINI in the last 168 hours. BNP (last 3 results) No results for input(s): PROBNP in the last 8760 hours. HbA1C:  Recent Labs  07/10/16 0332 07/10/16 2012  HGBA1C 6.1* 6.1*   CBG:  Recent Labs Lab 07/11/16 1142 07/11/16 1625 07/11/16 1948 07/11/16 2331 07/12/16 0434  GLUCAP 119* 171* 86 129* 114*   Lipid Profile:  Recent Labs  07/09/16 1246 07/10/16 2012  CHOL  --  199  HDL  --  49  LDLCALC  --  115*  TRIG 370*  173*  CHOLHDL  --  4.1   Thyroid Function Tests: No results for input(s): TSH, T4TOTAL, FREET4, T3FREE, THYROIDAB in the last 72 hours. Anemia Panel: No results for input(s): VITAMINB12, FOLATE, FERRITIN, TIBC, IRON, RETICCTPCT in the last 72 hours. Urine analysis:    Component Value Date/Time   COLORURINE AMBER (A) 07/09/2016 1533   APPEARANCEUR TURBID (A) 07/09/2016 1533   LABSPEC 1.018 07/09/2016 1533   PHURINE 5.0 07/09/2016 1533   GLUCOSEU NEGATIVE 07/09/2016 1533   HGBUR MODERATE (A) 07/09/2016 1533   BILIRUBINUR LARGE (A) 07/09/2016 1533   BILIRUBINUR neg 02/02/2015 1012   KETONESUR >80 (A) 07/09/2016 1533   PROTEINUR >300 (A) 07/09/2016  1533   UROBILINOGEN 0.2 02/02/2015 1012   UROBILINOGEN 0.2 04/19/2012 1206   NITRITE NEGATIVE 07/09/2016 1533   LEUKOCYTESUR NEGATIVE 07/09/2016 1533   Sepsis Labs: @LABRCNTIP (procalcitonin:4,lacticidven:4)  ) Recent Results (from the past 240 hour(s))  MRSA PCR Screening     Status: None   Collection Time: 07/10/16  6:59 AM  Result Value Ref Range Status   MRSA by PCR NEGATIVE NEGATIVE Final    Comment:        The GeneXpert MRSA Assay (FDA approved for NASAL specimens only), is one component of a comprehensive MRSA colonization surveillance program. It is not intended to diagnose MRSA infection nor to guide or monitor treatment for MRSA infections.   Culture, blood (routine x 2)     Status: None (Preliminary result)   Collection Time: 07/10/16  8:12 PM  Result Value Ref Range Status   Specimen Description BLOOD RIGHT ANTECUBITAL  Final   Special Requests   Final    BOTTLES DRAWN AEROBIC AND ANAEROBIC Blood Culture adequate volume   Culture NO GROWTH < 24 HOURS  Final   Report Status PENDING  Incomplete  Culture, blood (routine x 2)     Status: None (Preliminary result)   Collection Time: 07/10/16  8:21 PM  Result Value Ref Range Status   Specimen Description BLOOD LEFT ANTECUBITAL  Final   Special Requests   Final     BOTTLES DRAWN AEROBIC AND ANAEROBIC Blood Culture results may not be optimal due to an excessive volume of blood received in culture bottles   Culture NO GROWTH < 24 HOURS  Final   Report Status PENDING  Incomplete         Radiology Studies: Dg Chest Port 1 View  Result Date: 07/10/2016 CLINICAL DATA:  Acute respiratory failure. EXAM: PORTABLE CHEST 1 VIEW COMPARISON:  07/09/2016 FINDINGS: Cardiomediastinal silhouette is normal. Mediastinal contours appear intact. There is no evidence of focal airspace consolidation, pleural effusion or pneumothorax. Mild volume loss with streaky airspace opacity in the left lung base. Osseous structures are without acute abnormality. Soft tissues are grossly normal. IMPRESSION: Mild volume loss in the left lung base, otherwise unremarkable. Electronically Signed   By: Fidela Salisbury M.D.   On: 07/10/2016 19:16   Mr Abdomen Mrcp Moise Boring Contast  Result Date: 07/11/2016 CLINICAL DATA:  Liver mass on CT, evaluate for hepatocellular carcinoma EXAM: MRI ABDOMEN WITHOUT AND WITH CONTRAST (INCLUDING MRCP) TECHNIQUE: Multiplanar multisequence MR imaging of the abdomen was performed both before and after the administration of intravenous contrast. Heavily T2-weighted images of the biliary and pancreatic ducts were obtained, and three-dimensional MRCP images were rendered by post processing. CONTRAST:  86mL MULTIHANCE GADOBENATE DIMEGLUMINE 529 MG/ML IV SOLN COMPARISON:  CT abdomen/ pelvis dated 07/09/2016 FINDINGS: Motion degraded images. Lower chest: Lung bases are essentially clear. Hepatobiliary: Severe hepatic steatosis. Apparent heterogeneous perfusion of the posterior right hepatic lobe is favored to reflect phase encoding artifact (for example, series 2302/image 30). No suspicious/enhancing hepatic lesions. The focal hypoenhancing lesion on CT is not evident on MRI, although this may be related to technical limitations. Mildly wavy hepatic contour, without definite  findings to suggest cirrhosis. Gallbladder is unremarkable.  No evidence of cholelithiasis. No intrahepatic or extrahepatic ductal dilatation. Common duct measures 3 mm. No choledocholithiasis is seen. Pancreas: Peripancreatic inflammatory changes/ fluid, reflecting acute pancreatitis. No underlying pancreatic mass. No pancreatic ductal dilatation. No evidence of pseudocyst. Spleen:  Within normal limits. Adrenals/Urinary Tract: 1.8 cm left adrenal nodule (series 18/ image 16)  with suspected signal loss on opposed phase imaging, compatible with a benign adrenal adenoma. Right adrenal glands within normal limits. Kidneys are within normal limits.  No hydronephrosis. Stomach/Bowel: Stomach and visualized bowel are unremarkable. Vascular/Lymphatic:  No evidence of abdominal aortic aneurysm. No suspicious abdominal lymphadenopathy. Other: Small volume upper abdominal ascites/mesenteric fluid, related to acute pancreatitis. Musculoskeletal: No focal osseous lesions. IMPRESSION: Focal hepatic lesion on CT is not evident on MRI, although this may be related to technical limitations. Regardless, this is not considered concerning for hepatocellular carcinoma given the lack of cirrhosis, in the absence of known hepatitis B. Consider follow-up liver protocol CT abdomen with/without contrast in 3-6 months for further characterization. Underlying severe hepatic steatosis. 1.8 cm left adrenal nodule, favoring a benign adrenal adenoma. Acute uncomplicated pancreatitis. No cholelithiasis or choledocholithiasis is seen. Common duct measures 3 mm. Electronically Signed   By: Julian Hy M.D.   On: 07/11/2016 07:12        Scheduled Meds: . amLODipine  5 mg Oral Daily  . aspirin EC  81 mg Oral Daily  . enoxaparin (LOVENOX) injection  40 mg Subcutaneous Q24H  . feeding supplement  1 Container Oral TID BM  . folic acid  1 mg Oral Daily  . insulin aspart  0-9 Units Subcutaneous Q4H  . insulin glargine  10 Units  Subcutaneous Daily  . lactulose  30 g Oral TID  . metoprolol tartrate  5 mg Intravenous Q8H  . multivitamin with minerals  1 tablet Oral Daily  . nicotine  21 mg Transdermal Daily  . pantoprazole (PROTONIX) IV  40 mg Intravenous Q24H  . thiamine  100 mg Oral Daily   Continuous Infusions: . 0.9 % NaCl with KCl 20 mEq / L 75 mL/hr (07/11/16 1751)  . potassium chloride 10 mEq (07/12/16 0631)     LOS: 3 days    Time spent: 40 minutes    WOODS, Geraldo Docker, MD [facility] Pager [phone]   If 7PM-7AM, please contact night-coverage [email or web address] Password [email or web address] 07/12/2016, 7:45 AM

## 2016-07-12 NOTE — Consult Note (Addendum)
Advanced Heart Failure Team Consult Note   Primary Physician: Mackie Pai, PA-C Primary Cardiologist: New (Dr. Aundra Dubin)  Reason for Consultation: Acute diastolic CHF  HPI:    Terri Morris is seen today for evaluation of acute diastolic CHF at the request Dr Sherral Hammers   Pt admitted to [facility] 07/09/16 with 3 days severe abdominal pain, N/V. Pertinent labs on admission include Lipase 1875, abnormal liver functions with AST 168, ALT 136, total bilirubin 2.4 and ALP 84, potassium of 5.4, TG 370,bicarbonate less than 7, acute renal injury with creatinine 2.96, ABG with pH of 7.003, PCO2 24, PO2 30. WBC 9.4, lactate acid 1.89, UDS positive for opiates, negative pregnancy test, Tylenol level less than 10, salicylate level less than 7, negative urinalysis for UTI, but positive for ketone.  CT scan 07/09/16 with Acute pancreatitis without peripancreatic fluid or mass. Fatty infiltration of liver with non-specific 13 x 10 mm lesion. Attenuation with ? GB sludge vs stones. Transvaginal US 07/09/16 with pedunculated 2.5 cm subserosal fibroid. Small 9 mm intramural fibroid RUQ [place] 07/09/16 Negative for gallstones or biliary dilatation.  MRI abdomen W/Wo contrast 07/10/16 - Focal hepatic lesion on CT not evident on MRI. No concern for [place]. Underlying severe hepatic steatosis, 1.8 cm left adrenal nodule (favor benign adrenal adenoma). Acute uncomplicated pancreatitis.   Echo 07/11/16 LVEF 65-70%, Grade 2 DD. Otherwise normal.   Pt feeling OK currently. Still having mild abdominal pain. Drinking and tolerating meds, but not on diet yet.  Pt states she was SOB the day she came in, but related it to her abdominal pain.  She has no SOB on exertion at baseline.  Denies orthopnea or PND.  Over the past year, she has noticed mild peripheral edema, but has never been on a fluid pill such as lasix/torsemide/bumex/hctz. Per husband patient snores, but not significantly.  No family history of HF (mom present in room). Long  history of HTN. Denies lightheadedness or dizziness.   Output not accurate. So doubt I/Os are truly 7.6 L positive.   Review of Systems: [y] = yes, [ ]  = no   General: Weight gain [ ] ; Weight loss [ ] ; Anorexia [ ] ; Fatigue [ ] ; Fever [ ] ; Chills [ ] ; Weakness [ ]   Cardiac: Chest pain/pressure [ ] ; Resting SOB [ ] ; Exertional SOB [y]; Orthopnea [ ] ; Pedal Edema [ ] ; Palpitations [ ] ; Syncope [ ] ; Presyncope [ ] ; Paroxysmal nocturnal dyspnea[ ]   Pulmonary: Cough [ ] ; Wheezing[ ] ; Hemoptysis[ ] ; Sputum [ ] ; Snoring [y]  GI: Vomiting[y]; Dysphagia[ ] ; Melena[ ] ; Hematochezia [ ] ; Heartburn[ ] ; Abdominal pain [y]; Constipation [ ] ; Diarrhea [ ] ; BRBPR [ ]   GU: Hematuria[ ] ; Dysuria [ ] ; Nocturia[ ]   Vascular: Pain in legs with walking [ ] ; Pain in feet with lying flat [ ] ; Non-healing sores [ ] ; Stroke [ ] ; TIA [ ] ; Slurred speech [ ] ;  Neuro: Headaches[ ] ; Vertigo[ ] ; Seizures[ ] ; Paresthesias[ ] ;Blurred vision [ ] ; Diplopia [ ] ; Vision changes [ ]   Ortho/Skin: Arthritis [ ] ; Joint pain [ ] ; Muscle pain [ ] ; Joint swelling [ ] ; Back Pain [ ] ; Rash [ ]   Psych: Depression[ ] ; Anxiety[ ]   Heme: Bleeding problems [ ] ; Clotting disorders [ ] ; Anemia [ ]   Endocrine: Diabetes [ ] ; Thyroid dysfunction[ ]   Home Medications Prior to Admission medications   Medication Sig Start Date End Date Taking? Authorizing Provider  amLODipine (NORVASC) 5 MG tablet Take 1 tablet (5 mg total)  by mouth daily. Patient taking differently: Take 5 mg by mouth at bedtime.  01/12/16  Yes Saguier, Percell Miller, PA-C  aspirin EC 81 MG tablet Take 162 mg by mouth daily.   Yes [provider]  ibuprofen (ADVIL,MOTRIN) 200 MG tablet Take 400 mg by mouth every 6 (six) hours as needed for headache (pain).   Yes [provider]  lisinopril (PRINIVIL,ZESTRIL) 20 MG tablet TAKE 1 TABLET(20 MG) BY MOUTH DAILY Patient taking differently: Take 20 mg by mouth daily.  01/12/16  Yes Saguier, Percell Miller, PA-C  Choline Fenofibrate  (FENOFIBRIC ACID) 45 MG CPDR TAKE 1 CAPSULE(45 MG) BY MOUTH DAILY Patient not taking: Reported on 07/09/2016 12/24/15   Saguier, Percell Miller, PA-C  ferrous sulfate 325 (65 FE) MG EC tablet Take 1 tablet (325 mg total) by mouth 3 (three) times daily with meals. Patient not taking: Reported on 06/24/2016 01/13/16   Elise Benne    Past Medical History: Past Medical History:  Diagnosis Date  . AKI (acute kidney injury) ([place]) 07/09/2016  . Anxiety   . HLD (hyperlipidemia) 07/09/2016  . Hypertension   . Thyroid disease   . Vaginal Pap smear, abnormal     Past Surgical History: Past Surgical History:  Procedure Laterality Date  . ECTOPIC PREGNANCY SURGERY    . LEEP      Family History: Family History  Problem Relation Age of Onset  . Hypertension Mother   . Stroke Mother   . Arthritis Mother   . Alcohol abuse Mother   . Diabetes Mother   . Hypertension Father   . Alcohol abuse Father   . Diabetes Father     Social History: Social History   Social History  . Marital status: Married    Spouse name: N/A  . Number of children: N/A  . Years of education: N/A   Social History Main Topics  . Smoking status: Heavy Tobacco Smoker    Packs/day: 0.50    Years: 24.00    Types: Cigarettes  . Smokeless tobacco: Never Used  . Alcohol use 1.8 oz/week    3 Cans of beer per week     Comment: 3 glasses of wine on weekends  . Drug use: No  . Sexual activity: Yes    Birth control/ protection: None   Other Topics Concern  . None   Social History Narrative  . None    Allergies:  Allergies  Allergen Reactions  . Ferrous Sulfate Other (See Comments)    Severe stomach pains and constipation    Objective:    Vital Signs:   Temp:  [98.2 F (36.8 C)-98.4 F (36.9 C)] 98.3 F (36.8 C) (07/03 1116) Pulse Rate:  [76-104] 97 (07/03 1116) Resp:  [11-24] 11 (07/03 1116) BP: (131-161)/(74-104) 150/95 (07/03 1116) SpO2:  [98 %-100 %] 99 % (07/03 1116) Weight:  [180 lb (81.6  kg)] 180 lb (81.6 kg) (07/03 0400) Last BM Date: 07/12/16  Weight change: Filed Weights   07/09/16 1213 07/10/16 1406 07/12/16 0400  Weight: 181 lb (82.1 kg) 176 lb 6.4 oz (80 kg) 180 lb (81.6 kg)    Intake/Output:   Intake/Output Summary (Last 24 hours) at 07/12/16 1153 Last data filed at 07/12/16 1000  Gross per 24 hour  Intake             3465 ml  Output                0 ml  Net  3465 ml      Physical Exam    General:  Well appearing. No resp difficulty HEENT: normal Neck: supple. JVP 8-9 cm. Carotids 2+ bilat; no bruits. No lymphadenopathy or thyromegaly appreciated. Cor: PMI nondisplaced. Regular rate & rhythm. No rubs, gallops or murmurs. Lungs: CTAB, normal effort Abdomen: Obese, soft, mild diffuse tendnerness, nondistended. No hepatosplenomegaly. No bruits or masses. Good bowel sounds. Extremities: no cyanosis, clubbing, rash, no peripheral edema. Neuro: alert & orientedx3, cranial nerves grossly intact. moves all 4 extremities w/o difficulty. Affect pleasant   Telemetry   Personally reviewed, NSR 90s  EKG    07/09/16 Sinus Tach 119 bpm  Labs   Basic Metabolic Panel:  Recent Labs Lab 07/10/16 0831 07/10/16 1307 07/10/16 1424 07/11/16 0239 07/12/16 0317  NA 139 143 142 139 137  K 4.0 4.1 3.6 2.9* 2.6*  CL 115* 111 112* 108 104  CO2 10* 15* 15* 18* 23  GLUCOSE 154* 173* 195* 123* 128*  BUN 12 11 11 10  <5*  CREATININE 1.73* 1.55* 1.54* 1.18* 0.75  CALCIUM 8.7* 8.8* 8.6* 8.3* 8.4*  MG  --   --  1.7 1.6* 2.0  PHOS  --   --   --   --  1.1*    Liver Function Tests:  Recent Labs Lab 07/09/16 1246 07/11/16 0239 07/12/16 0317  AST 168* 60* 54*  ALT 136* 58* 49  ALKPHOS 84 48 45  BILITOT 2.4* 1.9* 1.7*  PROT 10.1* 6.2* 6.0*  ALBUMIN 5.6* 3.4* 3.2*    Recent Labs Lab 07/09/16 1246 07/10/16 0755 07/11/16 0239 07/12/16 0317  LIPASE 1,875* 2,009* 843* 324*    Recent Labs Lab 07/10/16 1350 07/11/16 0239 07/12/16 0317    AMMONIA 109* 95* 21    CBC:  Recent Labs Lab 07/09/16 1246  WBC 9.4  NEUTROABS 7.4  HGB 16.9*  HCT 49.5*  MCV 109.8*  PLT 269    Cardiac Enzymes: No results for input(s): CKTOTAL, CKMB, CKMBINDEX, TROPONINI in the last 168 hours.  BNP: BNP (last 3 results) No results for input(s): BNP in the last 8760 hours.  ProBNP (last 3 results) No results for input(s): PROBNP in the last 8760 hours.   CBG:  Recent Labs Lab 07/11/16 1625 07/11/16 1948 07/11/16 2331 07/12/16 0434 07/12/16 0734  GLUCAP 171* 86 129* 114* 128*    Coagulation Studies:  Recent Labs  07/10/16 2012 07/11/16 0239 07/12/16 0317  LABPROT 14.5 14.9 15.7*  INR 1.13 1.16 1.25     Imaging    No results found.   Medications:     Current Medications: . amLODipine  5 mg Oral Daily  . aspirin EC  81 mg Oral Daily  . enoxaparin (LOVENOX) injection  40 mg Subcutaneous Q24H  . feeding supplement  1 Container Oral TID BM  . folic acid  1 mg Oral Daily  . insulin aspart  0-9 Units Subcutaneous Q4H  . insulin glargine  10 Units Subcutaneous Daily  . lactulose  30 g Oral BID  . metoprolol tartrate  5 mg Intravenous Q8H  . multivitamin with minerals  1 tablet Oral Daily  . nicotine  21 mg Transdermal Daily  . pantoprazole (PROTONIX) IV  40 mg Intravenous Q24H  . thiamine  100 mg Oral Daily     Infusions: . 0.9 % NaCl with KCl 20 mEq / L 75 mL/hr (07/11/16 1751)       Patient Profile   Terri Morris is a 41 y.o. female with PMH  of ETOH abuse, anxiety, HTN, HLD, and thyroid disease. Patient admitted for severe, central abdominal pain with N/V. 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 CHF team consulted for Echo with Grade 2 DD dysfunction and volume overload.   Assessment/Plan   1. Acute diastolic CHF - + 0.8M this admission at the time of this note, though output not recorded.  - Volume status mildly elevated - NYHA class I at baseline. NYHA class III at admission, but pt cites  pain. - Would give lasix 40 mg IV at most and follow response as she is diuretic naive. - Once diuretic regimen set and stable suspect pt would be stable seeing [place].  We will follow as outpatient initially.  2. EtOH induced pancreatitis - PTA drinking 3 beers a day.  Aware that she needs to stop.  - CT scan with sludge/stones. - Per primary.  3. Acute respiratory failure with hypoxia - Sats to 80s initially. Now improved.  4. HTN - Remains elevated. Will focus on diuresis for now.  - Stop IV lopressor. Pt tolerating po meds.  5. Hypokalemia - 2.6 this am. Replaced and repeat K ordered for this afternoon.  6. AKI - Resolved. Follow with diuresis - UA positive. Afebrile. Has not received ABX.  7. AMS - Multifactorial.  - Ammonia 109 07/10/16. Remains on lactulose.  Length of Stay: 3  [name]  07/12/2016, 11:53 AM  Advanced Heart Failure Team Pager [phone] (M-F; 7a - 4p)  Please contact [facility] for night-coverage after hours (4p -7a ) and weekends on [email or web address]  Patient seen with PA, agree with the above note.  She was admitted with suspected ETOH pancreatitis.  For treatment of this, she got aggressive IV fluid.  She developed acute diastolic CHF in setting of volume infusion.  She is at risk due to history of hypertension.  BP is high today.  Echo reviewed, EF normal with grade II diastolic dysfunction.  - Agree with Lasix 40 mg IV x 1 and follow response (has never had Lasix before).  - Aggressive K repletion.  - Check BNP.  - Restart home BP meds.  She is on amlodipine, can restart lisinopril given creatinine back to baseline.   Loralie Champagne 07/12/2016 1:48 PM

## 2016-07-12 NOTE — Care Management Note (Signed)
Case Management Note  Patient Details  Name: CEAIRA ERNSTER MRN: [id number] Date of Birth: January 10, 1976  Subjective/Objective:   Pt admitted with abd pain, n/v             Action/Plan:  PTA from home with husband   Expected Discharge Date:                  Expected Discharge Plan:  Home/Self Care  In-House Referral:     Discharge planning Services  CM Consult  Post Acute Care Choice:    Choice offered to:     DME Arranged:    DME Agency:     HH Arranged:    [place] Agency:     Status of Service:     If discussed at [employer], dates discussed:    Additional Comments:  Maryclare Labrador, RN 07/12/2016, 2:39 PM

## 2016-07-13 DIAGNOSIS — K769 Liver disease, unspecified: Secondary | ICD-10-CM

## 2016-07-13 LAB — COMPREHENSIVE METABOLIC PANEL
ALBUMIN: 3.3 g/dL — AB (ref 3.5–5.0)
ALK PHOS: 54 U/L (ref 38–126)
ALT: 51 U/L (ref 14–54)
AST: 61 U/L — ABNORMAL HIGH (ref 15–41)
Anion gap: 9 (ref 5–15)
BUN: <5 mg/dL — ABNORMAL LOW (ref 6–20)
CALCIUM: 8.8 mg/dL — AB (ref 8.9–10.3)
CHLORIDE: 103 mmol/L (ref 101–111)
CO2: 26 mmol/L (ref 22–32)
CREATININE: 0.47 mg/dL (ref 0.44–1.00)
GFR calc non Af Amer: >60 mL/min (ref >60)
GLUCOSE: 110 mg/dL — AB (ref 65–99)
Potassium: 2.9 mmol/L — ABNORMAL LOW (ref 3.5–5.1)
SODIUM: 138 mmol/L (ref 135–145)
Total Bilirubin: 1.3 mg/dL — ABNORMAL HIGH (ref 0.3–1.2)
Total Protein: 6.7 g/dL (ref 6.5–8.1)

## 2016-07-13 LAB — GLUCOSE, CAPILLARY
GLUCOSE-CAPILLARY: 240 mg/dL — AB (ref 65–99)
GLUCOSE-CAPILLARY: 125 mg/dL — AB (ref 65–99)
Glucose-Capillary: 127 mg/dL — ABNORMAL HIGH (ref 65–99)
Glucose-Capillary: 131 mg/dL — ABNORMAL HIGH (ref 65–99)
Glucose-Capillary: 164 mg/dL — ABNORMAL HIGH (ref 65–99)

## 2016-07-13 LAB — AMMONIA: Ammonia: 51 umol/L — ABNORMAL HIGH (ref 9–35)

## 2016-07-13 LAB — PROTIME-INR
INR: 1.11
PROTHROMBIN TIME: 14.4 s (ref 11.4–15.2)

## 2016-07-13 LAB — MAGNESIUM: Magnesium: 1.6 mg/dL — ABNORMAL LOW (ref 1.7–2.4)

## 2016-07-13 LAB — LIPASE, BLOOD: LIPASE: 317 U/L — AB (ref 11–51)

## 2016-07-13 MED ORDER — TRAMADOL HCL 50 MG PO TABS
50.0000 mg | ORAL_TABLET | Freq: Four times a day (QID) | ORAL | Status: DC | PRN
  Administered 2016-07-16 (×2): 50 mg via ORAL
  Filled 2016-07-13 (×2): qty 1

## 2016-07-13 MED ORDER — AMLODIPINE BESYLATE 5 MG PO TABS: 5.0000 mg | ORAL_TABLET | Freq: Once | ORAL | Status: AC

## 2016-07-13 MED ORDER — MAGNESIUM SULFATE IN D5W 1-5 GM/100ML-% IV SOLN
1.0000 g | Freq: Once | INTRAVENOUS | Status: AC
  Administered 2016-07-13: 1 g via INTRAVENOUS
  Filled 2016-07-13: qty 100

## 2016-07-13 MED ORDER — POTASSIUM CHLORIDE 10 MEQ/100ML IV SOLN
10.0000 meq | INTRAVENOUS | Status: DC
  Administered 2016-07-13: 10 meq via INTRAVENOUS
  Filled 2016-07-13 (×2): qty 100

## 2016-07-13 MED ORDER — OXYCODONE HCL 5 MG PO TABS
5.0000 mg | ORAL_TABLET | ORAL | Status: DC | PRN
  Administered 2016-07-13 (×2): 5 mg via ORAL
  Administered 2016-07-13 – 2016-07-17 (×11): 10 mg via ORAL
  Filled 2016-07-13: qty 2
  Filled 2016-07-13: qty 1
  Filled 2016-07-13 (×11): qty 2

## 2016-07-13 MED ORDER — FUROSEMIDE 10 MG/ML IJ SOLN
40.0000 mg | Freq: Two times a day (BID) | INTRAMUSCULAR | Status: AC
  Administered 2016-07-13 (×2): 40 mg via INTRAVENOUS
  Filled 2016-07-13 (×2): qty 4

## 2016-07-13 MED ORDER — INSULIN ASPART 100 UNIT/ML ~~LOC~~ SOLN: 0.0000 [IU] | Freq: Every day | SUBCUTANEOUS | Status: DC

## 2016-07-13 MED ORDER — ACETAMINOPHEN 325 MG PO TABS: 650.0000 mg | ORAL_TABLET | Freq: Four times a day (QID) | ORAL | Status: DC | PRN

## 2016-07-13 MED ORDER — PANTOPRAZOLE SODIUM 40 MG PO TBEC
40.0000 mg | DELAYED_RELEASE_TABLET | Freq: Every day | ORAL | Status: DC
  Administered 2016-07-13 – 2016-07-16 (×4): 40 mg via ORAL
  Filled 2016-07-13 (×4): qty 1

## 2016-07-13 MED ORDER — POTASSIUM CHLORIDE CRYS ER 20 MEQ PO TBCR
40.0000 meq | EXTENDED_RELEASE_TABLET | Freq: Four times a day (QID) | ORAL | Status: AC
  Administered 2016-07-13 – 2016-07-14 (×4): 40 meq via ORAL
  Filled 2016-07-13 (×4): qty 2

## 2016-07-13 MED ORDER — POTASSIUM CHLORIDE 10 MEQ/100ML IV SOLN
10.0000 meq | INTRAVENOUS | Status: AC
  Administered 2016-07-13 (×2): 10 meq via INTRAVENOUS
  Filled 2016-07-13: qty 100

## 2016-07-13 MED ORDER — INSULIN ASPART 100 UNIT/ML ~~LOC~~ SOLN
0.0000 [IU] | Freq: Three times a day (TID) | SUBCUTANEOUS | Status: DC
  Administered 2016-07-13: 1 [IU] via SUBCUTANEOUS
  Administered 2016-07-14: 2 [IU] via SUBCUTANEOUS
  Administered 2016-07-14 (×2): 1 [IU] via SUBCUTANEOUS
  Administered 2016-07-15: 2 [IU] via SUBCUTANEOUS

## 2016-07-13 MED ORDER — AMLODIPINE BESYLATE 10 MG PO TABS
10.0000 mg | ORAL_TABLET | Freq: Every day | ORAL | Status: DC
  Administered 2016-07-14 – 2016-07-17 (×4): 10 mg via ORAL
  Filled 2016-07-13 (×4): qty 1

## 2016-07-13 MED ORDER — MAGNESIUM SULFATE 50 % IJ SOLN
1.0000 g | Freq: Once | INTRAMUSCULAR | Status: DC
  Filled 2016-07-13: qty 2

## 2016-07-13 NOTE — Progress Notes (Signed)
Nutrition Follow-up  DOCUMENTATION CODES:   Not applicable  INTERVENTION:   -Continue Boost Breeze po TID, each supplement provides 250 kcal and 9 grams of protein -RD will follow for diet advancement and supplement as appropriate -DM diet education provided; see education note for further details  NUTRITION DIAGNOSIS:   Predicted suboptimal nutrient intake related to acute illness (pancreatitis) as evidenced by percent weight loss.  Progressing  GOAL:   Patient will meet greater than or equal to 90% of their needs  Progressing  MONITOR:   PO intake, Supplement acceptance, Diet advancement, Labs, Weight trends, Skin, I & O's  REASON FOR ASSESSMENT:   Consult Diet education  ASSESSMENT:   Terri Morris is a 41 y.o. female with medical history significant of hypertension, hyperlipidemia, anxiety, tobacco abuse, alcohol use, who presents with nausea, vomiting, abdominal pain.  Spoke with pt, who reports feeling much better today. She reports tolerating the clear liquids a lot better today from previous days, but is feeling tired of the limitations of the diet.   Pt states she has experienced poor intake 1-2 weeks PTA, due to nausea and inability to keep foods down. Prior to this, pt was intentionally trying to lose weight by following a vegetarian diet- she initially thought she was experiencing these symptoms due to reintroducing meat back into her diet.   Pt reports UBW is around 175#, however, this is not consistent with wt hx.   Nutrition-Focused physical exam completed. Findings are no fat depletion, no muscle depletion, and no edema.   Pt with new dx of DM; see education note for further details.   Labs reviewed: CBGS: 127-240, K: 2.9 (on supplementation).   Diet Order:  Diet clear liquid Room service appropriate? Yes; Fluid consistency: Thin  Skin:  Reviewed, no issues  Last BM:  07/12/16  Height:   Ht Readings from Last 1 Encounters:  07/09/16 5\' 11"   (1.803 m)    Weight:   Wt Readings from Last 1 Encounters:  07/13/16 179 lb 9.6 oz (81.5 kg)    Ideal Body Weight:  70.5 kg  BMI:  Body mass index is 25.05 kg/m.  Estimated Nutritional Needs:   Kcal:  1800-2000  Protein:  105-120 grams  Fluid:  1.8-2.0 L  EDUCATION NEEDS:   No education needs identified at this time  Terri Morris A. Jimmye Norman, RD, LDN, CDE Pager: [phone] After hours Pager: [phone]

## 2016-07-13 NOTE — Progress Notes (Signed)
[facility] TEAM 1 - Stepdown/ICU TEAM  Terri Morris  FYB:[id number] DOB: [birth year] DOA: 07/09/2016 PCP: Mackie Pai, PA-C    Brief Narrative:  41yo F Hx EtOH abuse, Anxiety, HTN, HLD, and Thyroid disease who presented with 3 days of nausea, vomiting, and abdominal pain.  In the ED she was found to have a Lipase of 1875, AST 168, ALT 136, total bilirubin 2.4 and acute renal injury with creatinine 2.96.   Subjective: The pt c/o some ongoing intermittent epigastric pain.  She also notes signif DOE.  She denies cp, n/v, or HA.  She is tolerating her clear liquid diet w/o difficulty.    Assessment & Plan:  Acute alcohol associated pancreatitis Slowly improving - attempt to advance diet again - transition to oral pain meds only   Alcohol abuse No evidence of acute withdrawal - pt has been counseled on absolute need to stop EtOH entirely  Fatty infiltrate of the liver Noted on ultrasound of the abdomen 6/30 - counseled pt on potential for this to progress to cirrhosis and absolute need to d/c EtOH use entirely   Toxic metabolic encephalopathy Multifactorial to include acute liver failure, acute pancreatitis, metabolic acidosis, EtOH withdrawal - resolved - cont lactulose   DKA? - ?newly diagnosed DM A1c 6.1 therefore NOT diagnostic for DM - CBG quite variable at this time - stop lantus for now and follow trend - this will need to be followed closely as an outpt as it is possible the pt will not require any medical tx once her pancreatic inflammation resolves   Acute renal failure Resolved w/ crt now normal   Nonspecific liver lesion Nonspecific 13 x 10 mm lesion in the right lobe noted on CT scan abdomen - no liver lesion noted on MRCP - follow-up liver protocol CT abdomen with contrast suggested in 3-6 months  Acute hypoxic respiratory failure Likely due to obtundation plus acute pancreatitis - resolved  Severe hypokalemia  Due to total body deficit, as well as diuretic  use - replace and follow  Hypomagnesemia Replete and follow   Tobacco abuse  HTN BP variable - meds being adjusted by CHF Team   Newly diagnosed grade 2 Diastolic CHF  Diuresing w/ improvement in sx   HLD  DVT prophylaxis: Lovenox Code Status: FULL CODE Family Communication: no family present at time of exam  Disposition Plan: stable for transfer to tele bed    Consultants:  None  Procedures: none  Antimicrobials:  none   Objective: Blood pressure (!) 127/96, pulse (!) 108, temperature 98 F (36.7 C), temperature source Oral, resp. rate 19, height 5\' 11"  (1.803 m), weight 81.5 kg (179 lb 9.6 oz), last menstrual period 05/18/2016, SpO2 98 %.  Intake/Output Summary (Last 24 hours) at 07/13/16 1322 Last data filed at 07/13/16 1147  Gross per 24 hour  Intake             1580 ml  Output             2000 ml  Net             -420 ml   Filed Weights   07/10/16 1406 07/12/16 0400 07/13/16 0330  Weight: 80 kg (176 lb 6.4 oz) 81.6 kg (180 lb) 81.5 kg (179 lb 9.6 oz)    Examination: General: No acute respiratory distress while at rest  Lungs: fine bibasilar crackles - no wheezing  Cardiovascular: RRR w/o M or rub  Abdomen: still tender to palpation in epigastrium, no  rebound, nondistended, soft, BS+ Extremities: trace edema B LE   CBC:  Recent Labs Lab 07/09/16 1246  WBC 9.4  NEUTROABS 7.4  HGB 16.9*  HCT 49.5*  MCV 109.8*  PLT 621   Basic Metabolic Panel:  Recent Labs Lab 07/10/16 1307 07/10/16 1424 07/11/16 0239 07/12/16 0317 07/12/16 1321 07/12/16 1808 07/13/16 0227  NA 143 142 139 137  --   --  138  K 4.1 3.6 2.9* 2.6* 2.6* 3.2* 2.9*  CL 111 112* 108 104  --   --  103  CO2 15* 15* 18* 23  --   --  26  GLUCOSE 173* 195* 123* 128*  --   --  110*  BUN 11 11 10  <5*  --   --  <5*  CREATININE 1.55* 1.54* 1.18* 0.75  --   --  0.47  CALCIUM 8.8* 8.6* 8.3* 8.4*  --   --  8.8*  MG  --  1.7 1.6* 2.0  --  1.8 1.6*  PHOS  --   --   --  1.1*  --   --   --     GFR: Estimated Creatinine Clearance: 102.4 mL/min (by C-G formula based on SCr of 0.47 mg/dL).  Liver Function Tests:  Recent Labs Lab 07/09/16 1246 07/11/16 0239 07/12/16 0317 07/13/16 0227  AST 168* 60* 54* 61*  ALT 136* 58* 49 51  ALKPHOS 84 48 45 54  BILITOT 2.4* 1.9* 1.7* 1.3*  PROT 10.1* 6.2* 6.0* 6.7  ALBUMIN 5.6* 3.4* 3.2* 3.3*    Recent Labs Lab 07/09/16 1246 07/10/16 0755 07/11/16 0239 07/12/16 0317 07/13/16 0227  LIPASE 1,875* 2,009* 843* 324* 317*    Recent Labs Lab 07/10/16 1350 07/11/16 0239 07/12/16 0317 07/13/16 0227  AMMONIA 109* 95* 21 51*    Coagulation Profile:  Recent Labs Lab 07/10/16 2012 07/11/16 0239 07/12/16 0317 07/13/16 0227  INR 1.13 1.16 1.25 1.11    HbA1C: Hgb A1c MFr Bld  Date/Time Value Ref Range Status  07/10/2016 08:12 PM 6.1 (H) 4.8 - 5.6 % Final    Comment:    (NOTE)         Pre-diabetes: 5.7 - 6.4         Diabetes: >6.4         Glycemic control for adults with diabetes: <7.0   07/10/2016 03:32 AM 6.1 (H) 4.8 - 5.6 % Final    Comment:    (NOTE)         Pre-diabetes: 5.7 - 6.4         Diabetes: >6.4         Glycemic control for adults with diabetes: <7.0     CBG:  Recent Labs Lab 07/12/16 2007 07/12/16 2333 07/13/16 0332 07/13/16 0743 07/13/16 1200  GLUCAP 112* 170* 127* 131* 240*    Recent Results (from the past 240 hour(s))  MRSA PCR Screening     Status: None   Collection Time: 07/10/16  6:59 AM  Result Value Ref Range Status   MRSA by PCR NEGATIVE NEGATIVE Final    Comment:        The GeneXpert MRSA Assay (FDA approved for NASAL specimens only), is one component of a comprehensive MRSA colonization surveillance program. It is not intended to diagnose MRSA infection nor to guide or monitor treatment for MRSA infections.   Culture, blood (routine x 2)     Status: None (Preliminary result)   Collection Time: 07/10/16  8:12 PM  Result Value Ref Range  Status   Specimen  Description BLOOD RIGHT ANTECUBITAL  Final   Special Requests   Final    BOTTLES DRAWN AEROBIC AND ANAEROBIC Blood Culture adequate volume   Culture NO GROWTH 3 DAYS  Final   Report Status PENDING  Incomplete  Culture, blood (routine x 2)     Status: None (Preliminary result)   Collection Time: 07/10/16  8:21 PM  Result Value Ref Range Status   Specimen Description BLOOD LEFT ANTECUBITAL  Final   Special Requests   Final    BOTTLES DRAWN AEROBIC AND ANAEROBIC Blood Culture results may not be optimal due to an excessive volume of blood received in culture bottles   Culture NO GROWTH 3 DAYS  Final   Report Status PENDING  Incomplete  Culture, Urine     Status: Abnormal   Collection Time: 07/10/16  8:24 PM  Result Value Ref Range Status   Specimen Description URINE, RANDOM  Final   Special Requests NONE  Final   Culture MULTIPLE SPECIES PRESENT, SUGGEST RECOLLECTION (A)  Final   Report Status 07/12/2016 FINAL  Final     Scheduled Meds: . [START ON 07/14/2016] amLODipine  10 mg Oral Daily  . aspirin EC  81 mg Oral Daily  . enoxaparin (LOVENOX) injection  40 mg Subcutaneous Q24H  . feeding supplement  1 Container Oral TID BM  . folic acid  1 mg Oral Daily  . furosemide  40 mg Intravenous BID  . insulin aspart  0-9 Units Subcutaneous Q4H  . insulin glargine  10 Units Subcutaneous Daily  . lactulose  30 g Oral BID  . lisinopril  20 mg Oral Daily  . multivitamin with minerals  1 tablet Oral Daily  . nicotine  21 mg Transdermal Daily  . pantoprazole (PROTONIX) IV  40 mg Intravenous Q24H  . potassium chloride  40 mEq Oral Q6H  . thiamine  100 mg Oral Daily     LOS: 4 days   Cherene Altes, MD [facility] Office  [phone] Pager - Text Page per Shea Evans as per below:  On-Call/Text Page:      [email or web address]      password [email or web address]  If 7PM-7AM, please contact night-coverage [email or web address] Password [email or web address] 07/13/2016, 1:22 PM

## 2016-07-13 NOTE — Plan of Care (Signed)
Problem: Food- and Nutrition-Related Knowledge Deficit (NB-1.1) Goal: Nutrition education Formal process to instruct or train a patient/client in a skill or to impart knowledge to help patients/clients voluntarily manage or modify food choices and eating behavior to maintain or improve health. Outcome: Adequate for Discharge  RD consulted for nutrition education regarding diabetes.   Lab Results  Component Value Date   HGBA1C 6.1 (H) 07/10/2016   Pt recalls meeting with DM coordinator yesterday to discuss insulin administration. Per RN, pt intends to watch education videos when her husband arrives later today.   RD provided "Carbohydrate Counting for People with Diabetes" handout from the Academy of [employer] Dietetics. Discussed different food groups and their effects on blood sugar, emphasizing carbohydrate-containing foods. Provided list of carbohydrates and recommended serving sizes of common foods.  Discussed importance of controlled and consistent carbohydrate intake throughout the day. Provided examples of ways to balance meals/snacks and encouraged intake of high-fiber, whole grain complex carbohydrates. Teach back method used.  Expect fair to good compliance.  RD will continue to follow for acute nutrition issues.   Mat Stuard A. Jimmye Norman, RD, LDN, CDE Pager: [phone] After hours Pager: [phone]

## 2016-07-13 NOTE — Progress Notes (Signed)
Patient potassium level 2.9 and magnesium level 1.6 this morning.Text paged Forrest Moron NP. New orders received. Will continue to monitor.

## 2016-07-13 NOTE — Progress Notes (Signed)
Patient potassium level 3.2 this evening.Text paged Forrest Moron NP.New orders received .Will continue to monitor.

## 2016-07-13 NOTE — Progress Notes (Signed)
Patient ID: Terri Morris, female   DOB: 1975-08-18, 41 y.o.   MRN: [id number]     Advanced Heart Failure Rounding Note Primary Cardiologist: Aundra Dubin  Subjective:    Some diuresis with Lasix IV yesterday, weight down 1 lb.  Breathing better at rest but short of breath walking in room.   Objective:   Weight Range: 179 lb 9.6 oz (81.5 kg) Body mass index is 25.05 kg/m.   Vital Signs:   Temp:  [97.4 F (36.3 C)-98.6 F (37 C)] 98.4 F (36.9 C) (07/04 0758) Pulse Rate:  [82-110] 89 (07/04 0758) Resp:  [11-22] 14 (07/04 0758) BP: (130-150)/(87-97) 149/96 (07/04 0758) SpO2:  [98 %-100 %] 100 % (07/04 0758) Weight:  [179 lb 9.6 oz (81.5 kg)] 179 lb 9.6 oz (81.5 kg) (07/04 0330) Last BM Date: 07/12/16  Weight change: Filed Weights   07/10/16 1406 07/12/16 0400 07/13/16 0330  Weight: 176 lb 6.4 oz (80 kg) 180 lb (81.6 kg) 179 lb 9.6 oz (81.5 kg)    Intake/Output:   Intake/Output Summary (Last 24 hours) at 07/13/16 0933 Last data filed at 07/13/16 0859  Gross per 24 hour  Intake             1745 ml  Output             2000 ml  Net             -255 ml      Physical Exam    General:  Well appearing. No resp difficulty HEENT: Normal Neck: Supple. JVP 8-9 cm. Carotids 2+ bilat; no bruits. No lymphadenopathy or thyromegaly appreciated. Cor: PMI nondisplaced. Regular rate & rhythm. No rubs, gallops or murmurs. Lungs: Clear Abdomen: Soft, nontender, nondistended. No hepatosplenomegaly. No bruits or masses. Good bowel sounds. Extremities: No cyanosis, clubbing, rash, edema Neuro: Alert & orientedx3, cranial nerves grossly intact. moves all 4 extremities w/o difficulty. Affect pleasant   Telemetry   NSR, personally reviewed.   Labs    CBC No results for input(s): WBC, NEUTROABS, HGB, HCT, MCV, PLT in the last 72 hours. Basic Metabolic Panel  Recent Labs  07/12/16 0317  07/12/16 1808 07/13/16 0227  NA 137  --   --  138  K 2.6*  < > 3.2* 2.9*  CL 104  --   --   103  CO2 23  --   --  26  GLUCOSE 128*  --   --  110*  BUN <5*  --   --  <5*  CREATININE 0.75  --   --  0.47  CALCIUM 8.4*  --   --  8.8*  MG 2.0  --  1.8 1.6*  PHOS 1.1*  --   --   --   < > = values in this interval not displayed. Liver Function Tests  Recent Labs  07/12/16 0317 07/13/16 0227  AST 54* 61*  ALT 49 51  ALKPHOS 45 54  BILITOT 1.7* 1.3*  PROT 6.0* 6.7  ALBUMIN 3.2* 3.3*    Recent Labs  07/12/16 0317 07/13/16 0227  LIPASE 324* 317*   Cardiac Enzymes No results for input(s): CKTOTAL, CKMB, CKMBINDEX, TROPONINI in the last 72 hours.  BNP: BNP (last 3 results)  Recent Labs  07/12/16 1808  BNP 84.7    ProBNP (last 3 results) No results for input(s): PROBNP in the last 8760 hours.   D-Dimer No results for input(s): DDIMER in the last 72 hours. Hemoglobin A1C  Recent Labs  07/10/16  2012  HGBA1C 6.1*   Fasting Lipid Panel  Recent Labs  07/10/16 2012  CHOL 199  HDL 49  LDLCALC 115*  TRIG 173*  CHOLHDL 4.1   Thyroid Function Tests No results for input(s): TSH, T4TOTAL, T3FREE, THYROIDAB in the last 72 hours.  Invalid input(s): FREET3  Other results:   Imaging     No results found.   Medications:     Scheduled Medications: . [START ON 07/14/2016] amLODipine  10 mg Oral Daily  . amLODipine  5 mg Oral Once  . aspirin EC  81 mg Oral Daily  . enoxaparin (LOVENOX) injection  40 mg Subcutaneous Q24H  . feeding supplement  1 Container Oral TID BM  . folic acid  1 mg Oral Daily  . furosemide  40 mg Intravenous BID  . insulin aspart  0-9 Units Subcutaneous Q4H  . insulin glargine  10 Units Subcutaneous Daily  . lactulose  30 g Oral BID  . lisinopril  20 mg Oral Daily  . magnesium sulfate  1 g Intravenous Once  . multivitamin with minerals  1 tablet Oral Daily  . nicotine  21 mg Transdermal Daily  . pantoprazole (PROTONIX) IV  40 mg Intravenous Q24H  . potassium chloride  40 mEq Oral Q6H  . thiamine  100 mg Oral Daily      Infusions: . potassium chloride Stopped (07/13/16 1006)     PRN Medications:  hydrALAZINE, morphine injection, ondansetron (ZOFRAN) IV, pneumococcal 23 valent vaccine, zolpidem    Patient Profile   AILEA RHATIGAN is a 41 y.o. female with PMH of ETOH abuse, anxiety, HTN, HLD, and thyroid disease. Patient admitted for severe, central abdominal pain with N/V. 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 CHF team consulted for Echo with Grade 2 DD dysfunction and volume overload.   Assessment/Plan   1. Acute diastolic CHF: She was admitted with suspected ETOH pancreatitis.  For treatment of this, she got aggressive IV fluid. She developed acute diastolic CHF with hypoxemia in setting of volume infusion.  She is at risk due to history of hypertension.  BP remains high today.  Echo showed EF normal with grade II diastolic dysfunction.  On exam, she still has some volume overload with exertional dyspnea.  - Lasix 40 mg IV x 2 doses today.  2. EtOH induced pancreatitis:  PTA drinking 3 beers a day.  Aware that she needs to stop.  Abdominal pain improved.  3. HTN: BP remains elevated, can increase amlodipine to 10 mg daily.  4. Hypokalemia: Replace K and Mg aggressively with diuresis.   5. AKI: Resolved, follow with diuresis.    Length of Stay: 4  Loralie Champagne, MD  07/13/2016, 9:33 AM  Advanced Heart Failure Team Pager [phone] (M-F; 7a - 4p)  Please contact [facility] for night-coverage after hours (4p -7a ) and weekends on [email or web address]

## 2016-07-14 DIAGNOSIS — I10 Essential (primary) hypertension: Secondary | ICD-10-CM

## 2016-07-14 DIAGNOSIS — K852 Alcohol induced acute pancreatitis without necrosis or infection: Secondary | ICD-10-CM

## 2016-07-14 LAB — GLUCOSE, CAPILLARY
GLUCOSE-CAPILLARY: 140 mg/dL — AB (ref 65–99)
GLUCOSE-CAPILLARY: 157 mg/dL — AB (ref 65–99)
Glucose-Capillary: 144 mg/dL — ABNORMAL HIGH (ref 65–99)
Glucose-Capillary: 145 mg/dL — ABNORMAL HIGH (ref 65–99)

## 2016-07-14 LAB — COMPREHENSIVE METABOLIC PANEL
ALBUMIN: 3.5 g/dL (ref 3.5–5.0)
ALK PHOS: 55 U/L (ref 38–126)
ALT: 73 U/L — ABNORMAL HIGH (ref 14–54)
AST: 124 U/L — AB (ref 15–41)
Anion gap: 7 (ref 5–15)
BILIRUBIN TOTAL: 1 mg/dL (ref 0.3–1.2)
CALCIUM: 8.9 mg/dL (ref 8.9–10.3)
CO2: 28 mmol/L (ref 22–32)
Chloride: 99 mmol/L — ABNORMAL LOW (ref 101–111)
Creatinine, Ser: 0.51 mg/dL (ref 0.44–1.00)
GFR calc Af Amer: >60 mL/min (ref >60)
GFR calc non Af Amer: >60 mL/min (ref >60)
GLUCOSE: 149 mg/dL — AB (ref 65–99)
Potassium: 4.2 mmol/L (ref 3.5–5.1)
Sodium: 134 mmol/L — ABNORMAL LOW (ref 135–145)
TOTAL PROTEIN: 6.6 g/dL (ref 6.5–8.1)

## 2016-07-14 LAB — MAGNESIUM: MAGNESIUM: 1.7 mg/dL (ref 1.7–2.4)

## 2016-07-14 LAB — AMMONIA: Ammonia: 39 umol/L — ABNORMAL HIGH (ref 9–35)

## 2016-07-14 LAB — PROTIME-INR
INR: 1.08
Prothrombin Time: 14 seconds (ref 11.4–15.2)

## 2016-07-14 LAB — LIPASE, BLOOD: Lipase: 214 U/L — ABNORMAL HIGH (ref 11–51)

## 2016-07-14 MED ORDER — MAGNESIUM SULFATE 4 GM/100ML IV SOLN
4.0000 g | Freq: Once | INTRAVENOUS | Status: AC
  Administered 2016-07-14: 4 g via INTRAVENOUS
  Filled 2016-07-14: qty 100

## 2016-07-14 MED ORDER — FUROSEMIDE 10 MG/ML IJ SOLN
40.0000 mg | Freq: Once | INTRAMUSCULAR | Status: AC
  Administered 2016-07-14: 40 mg via INTRAVENOUS
  Filled 2016-07-14: qty 4

## 2016-07-14 MED ORDER — GLUCERNA SHAKE PO LIQD
237.0000 mL | Freq: Three times a day (TID) | ORAL | Status: DC
  Administered 2016-07-14 – 2016-07-17 (×6): 237 mL via ORAL

## 2016-07-14 MED ORDER — FAMOTIDINE 20 MG PO TABS: 20.0000 mg | ORAL_TABLET | Freq: Every day | ORAL | Status: DC | PRN

## 2016-07-14 NOTE — Progress Notes (Signed)
Brief Nutrition Follow-Up Note  Chart reviewed. RD last evaluated on 07/13/16 as well as provided DM diet education; see note for further details. Noted pt has been advanced to a full liquid diet. Meal completion 100%.   RD will discontinue Boost Breeze supplement and add Glucerna Shake po TID, each supplement provides 220 kcal and 10 grams of protein.   RD will continue to follow for acute nutrition issues.   Mirabella Hilario A. Jimmye Norman, RD, LDN, CDE Pager: [phone] After hours Pager: [phone]

## 2016-07-14 NOTE — Progress Notes (Signed)
OT Cancellation Note  Patient Details Name: Terri Morris MRN: [id number] DOB: 1975-09-07   Cancelled Treatment:    Reason Eval/Treat Not Completed: OT screened, no needs identified, will sign off. Pt is functioning independently in ADL per PT report.  Malka So 07/14/2016, 1:15 PM  [phone]

## 2016-07-14 NOTE — Progress Notes (Signed)
Patient ID: Terri Morris, female   DOB: 1975-11-21, 41 y.o.   MRN: [id number]                                                                PROGRESS NOTE                                                                                                                                                                                                             Patient Demographics:    Terri Morris, is a 41 y.o. female, DOB - 1975-04-16, WNI:[id number]  Admit date - 07/09/2016   Admitting Physician Ivor Costa, MD  Outpatient Primary MD for the patient is Saguier, Iris Pert  LOS - 5  [facility]patient Specialists  Chief Complaint  Patient presents with  . Abdominal Pain  . Nausea       Brief Narrative 41yo F HxEtOH abuse, Anxiety, HTN, HLD, and Thyroid disease who presented with 3 days of nausea, vomiting, and abdominal pain.  In the ED she was found to have a Lipase of 1875, AST 168, ALT 136, total bilirubin 2.4 and acute renal injury with creatinine 2.96.     Subjective:    Terri Morris today has slight abdominal pain.  Denies fever, chills, n/v, diarrhea, brbpr.  , No headache, No chest pain,- No Nausea, No new weakness tingling or numbness, No Cough - SOB.   Assessment  & Plan :    Principal Problem:   Acute pancreatitis Active Problems:   Hypertension   Smoking   DKA (diabetic ketoacidoses) (HCC)   AKI (acute kidney injury) ([place])   Hyperkalemia   Alcohol use   Metabolic acidosis   Acute respiratory failure with hypoxia (HCC)   Liver lesion   HLD (hyperlipidemia)   Acute diastolic CHF (congestive heart failure) (HCC)   Acute alcohol associated pancreatitis Slowly improving -  Full liquid diet due to abdominal pain  Gerd Add pepcid  20mg  po qday prn  Alcohol abuse No evidence of acute withdrawal - pt has been counseled on absolute need to stop EtOH entirely  Fatty infiltrate of the liver Noted on ultrasound of the abdomen 6/30 - counseled pt on  potential for this to progress to cirrhosis and absolute need to d/c EtOH use entirely   Toxic metabolic encephalopathy Multifactorial  to include acute liver failure, acute pancreatitis, metabolic acidosis, EtOH withdrawal - resolved - cont lactulose   DKA? - ?newly diagnosed DM A1c 6.1 therefore NOT diagnostic for DM - CBG quite variable at this time - stop lantus for now and follow trend - this will need to be followed closely as an outpt as it is possible the pt will not require any medical tx once her pancreatic inflammation resolves   Acute renal failure Resolved w/ crt now normal   Nonspecific liver lesion Nonspecific 13 x 10 mm lesion in the right lobe noted on CT scan abdomen - no liver lesion noted on MRCP - follow-up liver protocol CT abdomen with contrast suggested in 3-6 months  Acute hypoxic respiratory failure Likely due to obtundation plus acute pancreatitis - resolved  Severe hypokalemia  improved Due to total body deficit, as well as diuretic use - replace and follow  Hypomagnesemia Replete and follow   Tobacco abuse  HTN BP variable - meds being adjusted by CHF Team   Newly diagnosed grade 2 Diastolic CHF  Diuresing w/ improvement in sx     Code Status : FULL CODE  Family Communication  :  w patient  Disposition Plan  :  home  Barriers For Discharge :   Consults  :   none  Procedures  :    DVT Prophylaxis  :  Lovenox - SCDs   Lab Results  Component Value Date   PLT 269 07/09/2016    Antibiotics  :    Anti-infectives    None        Objective:   Vitals:   07/13/16 1659 07/13/16 1701 07/13/16 2015 07/14/16 0453  BP: 132/83  129/88 (!) 141/97  Pulse: 94  (!) 108 91  Resp: 17  18 18   Temp: 98.6 F (37 C)  98.6 F (37 C) (!) 97.5 F (36.4 C)  TempSrc:   Oral Oral  SpO2: 99%  100% 100%  Weight:  80.4 kg (177 lb 4.8 oz)    Height:  5\' 11"  (1.803 m)      Wt Readings from Last 3 Encounters:  07/13/16 80.4 kg (177 lb 4.8  oz)  06/24/16 82.1 kg (181 lb)  01/12/16 92.7 kg (204 lb 7 oz)     Intake/Output Summary (Last 24 hours) at 07/14/16 0830 Last data filed at 07/14/16 0453  Gross per 24 hour  Intake              940 ml  Output             3300 ml  Net            -2360 ml     Physical Exam  Awake Alert, Oriented X 3, No new F.N deficits, Normal affect [place].AT,PERRAL Supple Neck,No JVD, No cervical lymphadenopathy appriciated.  Symmetrical Chest wall movement, Good air movement bilaterally, CTAB RRR,No Gallops,Rubs or new Murmurs, No Parasternal Heave +ve B.Sounds, Abd Soft, slight tenderness,  No organomegaly appriciated, No rebound - guarding or rigidity. No Cyanosis, Clubbing or edema, No new Rash or bruise      Data Review:    CBC  Recent Labs Lab 07/09/16 1246  WBC 9.4  HGB 16.9*  HCT 49.5*  PLT 269  MCV 109.8*  MCH 37.5*  MCHC 34.1  RDW 17.8*  LYMPHSABS 1.0  MONOABS 1.0  EOSABS 0.0  BASOSABS 0.0    Chemistries   Recent Labs Lab 07/09/16 1246  07/10/16 1424 07/11/16 0239 07/12/16  3818 07/12/16 1321 07/12/16 1808 07/13/16 0227 07/14/16 0650  NA 138  < > 142 139 137  --   --  138 134*  K 5.4*  < > 3.6 2.9* 2.6* 2.6* 3.2* 2.9* 4.2  CL 101  < > 112* 108 104  --   --  103 99*  CO2 <7*  < > 15* 18* 23  --   --  26 28  GLUCOSE 249*  < > 195* 123* 128*  --   --  110* 149*  BUN 15  < > 11 10 <5*  --   --  <5* <5*  CREATININE 2.96*  < > 1.54* 1.18* 0.75  --   --  0.47 0.51  CALCIUM 9.9  < > 8.6* 8.3* 8.4*  --   --  8.8* 8.9  MG  --   < > 1.7 1.6* 2.0  --  1.8 1.6* 1.7  AST 168*  --   --  60* 54*  --   --  61* 124*  ALT 136*  --   --  58* 49  --   --  51 73*  ALKPHOS 84  --   --  48 45  --   --  54 55  BILITOT 2.4*  --   --  1.9* 1.7*  --   --  1.3* 1.0  < > = values in this interval not displayed. ------------------------------------------------------------------------------------------------------------------ No results for input(s): CHOL, HDL, LDLCALC, TRIG,  CHOLHDL, LDLDIRECT in the last 72 hours.  Lab Results  Component Value Date   HGBA1C 6.1 (H) 07/10/2016   ------------------------------------------------------------------------------------------------------------------ No results for input(s): TSH, T4TOTAL, T3FREE, THYROIDAB in the last 72 hours.  Invalid input(s): FREET3 ------------------------------------------------------------------------------------------------------------------ No results for input(s): VITAMINB12, FOLATE, FERRITIN, TIBC, IRON, RETICCTPCT in the last 72 hours.  Coagulation profile  Recent Labs Lab 07/10/16 2012 07/11/16 0239 07/12/16 0317 07/13/16 0227 07/14/16 0650  INR 1.13 1.16 1.25 1.11 1.08    No results for input(s): DDIMER in the last 72 hours.  Cardiac Enzymes No results for input(s): CKMB, TROPONINI, MYOGLOBIN in the last 168 hours.  Invalid input(s): CK ------------------------------------------------------------------------------------------------------------------    Component Value Date/Time   BNP 84.7 07/12/2016 1808    Inpatient Medications  Scheduled Meds: . amLODipine  10 mg Oral Daily  . aspirin EC  81 mg Oral Daily  . enoxaparin (LOVENOX) injection  40 mg Subcutaneous Q24H  . feeding supplement  1 Container Oral TID BM  . folic acid  1 mg Oral Daily  . insulin aspart  0-5 Units Subcutaneous QHS  . insulin aspart  0-9 Units Subcutaneous TID WC  . lactulose  30 g Oral BID  . lisinopril  20 mg Oral Daily  . multivitamin with minerals  1 tablet Oral Daily  . nicotine  21 mg Transdermal Daily  . pantoprazole  40 mg Oral Q1200  . thiamine  100 mg Oral Daily   Continuous Infusions: PRN Meds:.acetaminophen, hydrALAZINE, ondansetron (ZOFRAN) IV, oxyCODONE, pneumococcal 23 valent vaccine, traMADol, zolpidem  Micro Results Recent Results (from the past 240 hour(s))  MRSA PCR Screening     Status: None   Collection Time: 07/10/16  6:59 AM  Result Value Ref Range Status    MRSA by PCR NEGATIVE NEGATIVE Final    Comment:        The GeneXpert MRSA Assay (FDA approved for NASAL specimens only), is one component of a comprehensive MRSA colonization surveillance program. It is not intended to diagnose MRSA infection  nor to guide or monitor treatment for MRSA infections.   Culture, blood (routine x 2)     Status: None (Preliminary result)   Collection Time: 07/10/16  8:12 PM  Result Value Ref Range Status   Specimen Description BLOOD RIGHT ANTECUBITAL  Final   Special Requests   Final    BOTTLES DRAWN AEROBIC AND ANAEROBIC Blood Culture adequate volume   Culture NO GROWTH 3 DAYS  Final   Report Status PENDING  Incomplete  Culture, blood (routine x 2)     Status: None (Preliminary result)   Collection Time: 07/10/16  8:21 PM  Result Value Ref Range Status   Specimen Description BLOOD LEFT ANTECUBITAL  Final   Special Requests   Final    BOTTLES DRAWN AEROBIC AND ANAEROBIC Blood Culture results may not be optimal due to an excessive volume of blood received in culture bottles   Culture NO GROWTH 3 DAYS  Final   Report Status PENDING  Incomplete  Culture, Urine     Status: Abnormal   Collection Time: 07/10/16  8:24 PM  Result Value Ref Range Status   Specimen Description URINE, RANDOM  Final   Special Requests NONE  Final   Culture MULTIPLE SPECIES PRESENT, SUGGEST RECOLLECTION (A)  Final   Report Status 07/12/2016 FINAL  Final    Radiology Reports Dg Chest 2 View  Result Date: 07/09/2016 CLINICAL DATA:  Admitted for acute pancreatitis with shortness of breath and oxygen desaturation. EXAM: CHEST  2 VIEW COMPARISON:  05/29/2015 chest radiograph. FINDINGS: Stable heart size and mediastinal contours are within normal limits. Both lungs are clear. The visualized skeletal structures are unremarkable. IMPRESSION: No acute pulmonary process identified. Electronically Signed   By: Kristine Garbe M.D.   On: 07/09/2016 22:09   US Transvaginal  Non-ob  Result Date: 07/09/2016 CLINICAL DATA:  41 year old female with left lower quadrant and suprapubic pain for 3-4 days. Negative serum pregnancy test today. Premenopausal. LMP 5 weeks ago. EXAM: TRANSABDOMINAL AND TRANSVAGINAL ULTRASOUND OF PELVIS DOPPLER ULTRASOUND OF OVARIES TECHNIQUE: Both transabdominal and transvaginal ultrasound examinations of the pelvis were performed. Transabdominal technique was performed for global imaging of the pelvis including uterus, ovaries, adnexal regions, and pelvic cul-de-sac. It was necessary to proceed with endovaginal exam following the transabdominal exam to visualize the ovaries. Color and duplex Doppler ultrasound was utilized to evaluate blood flow to the ovaries. COMPARISON:  Ob ultrasound 11/03/2009. FINDINGS: Uterus Measurements: 6.9 x 3.4 x 4.4 cm. Small intramural 9 mm fibroid at the posterior body of the uterus (image 59). Larger 2.5 cm pedunculated subserosal fibroid at the anterior fundus directed toward the left (image 63). There is some vascularity detected within this fibroid (images 65 and 79). Endometrium Thickness: 4 mm. No focal abnormality visualized. Incidental nabothian cysts at the cervix. Right ovary Only visible trans abdominally. Measurements: 2.5 x 2.8 x 2.2 cm. Simple appearing 19 mm cyst associated with the right ovary (image 26). Left ovary Only visible trans abdominally. Measurements: 3.0 x 2.0 x 1.6 cm. Normal appearance/no adnexal mass. Pulsed Doppler evaluation of both ovaries was difficult to the ovaries could not be visualized with transvaginal technique. Color Doppler flow is evident in both ovaries (right image 19 and left image 31) but spectral imaging could not be obtained. Other findings No abnormal free fluid. IMPRESSION: 1. No strong evidence of ovarian torsion, but Doppler evaluation of both ovaries was difficult and suboptimal. 2. Pedunculated 2.5 cm subserosal fibroid directed to the left from the anterior fundus. Smaller  9  mm intramural fibroid at the posterior body. 3. No pelvic free fluid. Electronically Signed   By: Genevie Ann M.D.   On: 07/09/2016 15:31   US Pelvis Complete  Result Date: 07/09/2016 CLINICAL DATA:  41 year old female with left lower quadrant and suprapubic pain for 3-4 days. Negative serum pregnancy test today. Premenopausal. LMP 5 weeks ago. EXAM: TRANSABDOMINAL AND TRANSVAGINAL ULTRASOUND OF PELVIS DOPPLER ULTRASOUND OF OVARIES TECHNIQUE: Both transabdominal and transvaginal ultrasound examinations of the pelvis were performed. Transabdominal technique was performed for global imaging of the pelvis including uterus, ovaries, adnexal regions, and pelvic cul-de-sac. It was necessary to proceed with endovaginal exam following the transabdominal exam to visualize the ovaries. Color and duplex Doppler ultrasound was utilized to evaluate blood flow to the ovaries. COMPARISON:  Ob ultrasound 11/03/2009. FINDINGS: Uterus Measurements: 6.9 x 3.4 x 4.4 cm. Small intramural 9 mm fibroid at the posterior body of the uterus (image 59). Larger 2.5 cm pedunculated subserosal fibroid at the anterior fundus directed toward the left (image 63). There is some vascularity detected within this fibroid (images 65 and 79). Endometrium Thickness: 4 mm. No focal abnormality visualized. Incidental nabothian cysts at the cervix. Right ovary Only visible trans abdominally. Measurements: 2.5 x 2.8 x 2.2 cm. Simple appearing 19 mm cyst associated with the right ovary (image 26). Left ovary Only visible trans abdominally. Measurements: 3.0 x 2.0 x 1.6 cm. Normal appearance/no adnexal mass. Pulsed Doppler evaluation of both ovaries was difficult to the ovaries could not be visualized with transvaginal technique. Color Doppler flow is evident in both ovaries (right image 19 and left image 31) but spectral imaging could not be obtained. Other findings No abnormal free fluid. IMPRESSION: 1. No strong evidence of ovarian torsion, but Doppler  evaluation of both ovaries was difficult and suboptimal. 2. Pedunculated 2.5 cm subserosal fibroid directed to the left from the anterior fundus. Smaller 9 mm intramural fibroid at the posterior body. 3. No pelvic free fluid. Electronically Signed   By: Genevie Ann M.D.   On: 07/09/2016 15:31   [place] Art/ven Flow Abd Pelv Doppler  Result Date: 07/09/2016 CLINICAL DATA:  41 year old female with left lower quadrant and suprapubic pain for 3-4 days. Negative serum pregnancy test today. Premenopausal. LMP 5 weeks ago. EXAM: TRANSABDOMINAL AND TRANSVAGINAL ULTRASOUND OF PELVIS DOPPLER ULTRASOUND OF OVARIES TECHNIQUE: Both transabdominal and transvaginal ultrasound examinations of the pelvis were performed. Transabdominal technique was performed for global imaging of the pelvis including uterus, ovaries, adnexal regions, and pelvic cul-de-sac. It was necessary to proceed with endovaginal exam following the transabdominal exam to visualize the ovaries. Color and duplex Doppler ultrasound was utilized to evaluate blood flow to the ovaries. COMPARISON:  Ob ultrasound 11/03/2009. FINDINGS: Uterus Measurements: 6.9 x 3.4 x 4.4 cm. Small intramural 9 mm fibroid at the posterior body of the uterus (image 59). Larger 2.5 cm pedunculated subserosal fibroid at the anterior fundus directed toward the left (image 63). There is some vascularity detected within this fibroid (images 65 and 79). Endometrium Thickness: 4 mm. No focal abnormality visualized. Incidental nabothian cysts at the cervix. Right ovary Only visible trans abdominally. Measurements: 2.5 x 2.8 x 2.2 cm. Simple appearing 19 mm cyst associated with the right ovary (image 26). Left ovary Only visible trans abdominally. Measurements: 3.0 x 2.0 x 1.6 cm. Normal appearance/no adnexal mass. Pulsed Doppler evaluation of both ovaries was difficult to the ovaries could not be visualized with transvaginal technique. Color Doppler flow is evident in both ovaries (right image  19 and  left image 31) but spectral imaging could not be obtained. Other findings No abnormal free fluid. IMPRESSION: 1. No strong evidence of ovarian torsion, but Doppler evaluation of both ovaries was difficult and suboptimal. 2. Pedunculated 2.5 cm subserosal fibroid directed to the left from the anterior fundus. Smaller 9 mm intramural fibroid at the posterior body. 3. No pelvic free fluid. Electronically Signed   By: Genevie Ann M.D.   On: 07/09/2016 15:31   Dg Chest Port 1 View  Result Date: 07/10/2016 CLINICAL DATA:  Acute respiratory failure. EXAM: PORTABLE CHEST 1 VIEW COMPARISON:  07/09/2016 FINDINGS: Cardiomediastinal silhouette is normal. Mediastinal contours appear intact. There is no evidence of focal airspace consolidation, pleural effusion or pneumothorax. Mild volume loss with streaky airspace opacity in the left lung base. Osseous structures are without acute abnormality. Soft tissues are grossly normal. IMPRESSION: Mild volume loss in the left lung base, otherwise unremarkable. Electronically Signed   By: Fidela Salisbury M.D.   On: 07/10/2016 19:16   Mr Abdomen Mrcp Moise Boring Contast  Result Date: 07/11/2016 CLINICAL DATA:  Liver mass on CT, evaluate for hepatocellular carcinoma EXAM: MRI ABDOMEN WITHOUT AND WITH CONTRAST (INCLUDING MRCP) TECHNIQUE: Multiplanar multisequence MR imaging of the abdomen was performed both before and after the administration of intravenous contrast. Heavily T2-weighted images of the biliary and pancreatic ducts were obtained, and three-dimensional MRCP images were rendered by post processing. CONTRAST:  66mL MULTIHANCE GADOBENATE DIMEGLUMINE 529 MG/ML IV SOLN COMPARISON:  CT abdomen/ pelvis dated 07/09/2016 FINDINGS: Motion degraded images. Lower chest: Lung bases are essentially clear. Hepatobiliary: Severe hepatic steatosis. Apparent heterogeneous perfusion of the posterior right hepatic lobe is favored to reflect phase encoding artifact (for example, series 2302/image 30).  No suspicious/enhancing hepatic lesions. The focal hypoenhancing lesion on CT is not evident on MRI, although this may be related to technical limitations. Mildly wavy hepatic contour, without definite findings to suggest cirrhosis. Gallbladder is unremarkable.  No evidence of cholelithiasis. No intrahepatic or extrahepatic ductal dilatation. Common duct measures 3 mm. No choledocholithiasis is seen. Pancreas: Peripancreatic inflammatory changes/ fluid, reflecting acute pancreatitis. No underlying pancreatic mass. No pancreatic ductal dilatation. No evidence of pseudocyst. Spleen:  Within normal limits. Adrenals/Urinary Tract: 1.8 cm left adrenal nodule (series 18/ image 16) with suspected signal loss on opposed phase imaging, compatible with a benign adrenal adenoma. Right adrenal glands within normal limits. Kidneys are within normal limits.  No hydronephrosis. Stomach/Bowel: Stomach and visualized bowel are unremarkable. Vascular/Lymphatic:  No evidence of abdominal aortic aneurysm. No suspicious abdominal lymphadenopathy. Other: Small volume upper abdominal ascites/mesenteric fluid, related to acute pancreatitis. Musculoskeletal: No focal osseous lesions. IMPRESSION: Focal hepatic lesion on CT is not evident on MRI, although this may be related to technical limitations. Regardless, this is not considered concerning for hepatocellular carcinoma given the lack of cirrhosis, in the absence of known hepatitis B. Consider follow-up liver protocol CT abdomen with/without contrast in 3-6 months for further characterization. Underlying severe hepatic steatosis. 1.8 cm left adrenal nodule, favoring a benign adrenal adenoma. Acute uncomplicated pancreatitis. No cholelithiasis or choledocholithiasis is seen. Common duct measures 3 mm. Electronically Signed   By: Julian Hy M.D.   On: 07/11/2016 07:12   Ct Renal Stone Study  Result Date: 07/09/2016 CLINICAL DATA:  Nausea, vomiting, and diarrhea for 3 days,  elevated lipase, acute kidney injury, elevated bilirubin, history hypertension EXAM: CT ABDOMEN AND PELVIS WITHOUT CONTRAST TECHNIQUE: Multidetector CT imaging of the abdomen and pelvis was performed following the standard  protocol without IV contrast. Sagittal and coronal MPR images reconstructed from axial data set. Oral contrast was not administered. COMPARISON:  None FINDINGS: Lower chest: Lung bases clear Hepatobiliary: Diffuse fatty infiltration of liver. Nonspecific low-attenuation focus posterior RIGHT lobe liver image 26, 13 x 10 mm. Intermediate attenuation material within gallbladder which could represent sludge or numerous tiny calculi. No biliary dilatation or additional hepatic abnormalities Pancreas: Pancreas is enlarged with poorly defined peripancreatic planes compatible with acute pancreatitis. No discrete pancreatic mass or fluid collection. Spleen: Normal appearance Adrenals/Urinary Tract: Thickening of both adrenal glands with question mass at LEFT adrenal gland 19 x 14 mm image 20. Kidneys, ureters, and bladder normal appearance. Stomach/Bowel: Normal appendix. Stomach and bowel loops normal appearance Vascular/Lymphatic: Aorta normal caliber with atherosclerotic calcification at bifurcation. No adenopathy. Few scattered pelvic phleboliths. Reproductive: Exophytic leiomyoma at LEFT uterus, 2.7 x 1.9 x 1.7 cm. Uterus and adnexa otherwise unremarkable. Other: Umbilical hernia containing fat.  No free air or free fluid. Musculoskeletal: No acute osseous findings. IMPRESSION: Acute pancreatitis without peripancreatic fluid collection or pancreatic mass. Fatty infiltration of liver with a nonspecific 13 x 10 mm lesion in the RIGHT lobe. Intermediate attenuation material within gallbladder question sludge or stones, could potentially represent vicarious excretion of contrast material if patient has had recent contrast administration. 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 Small uterine leiomyoma. Umbilical hernia  containing fat. Electronically Signed   By: Lavonia Dana M.D.   On: 07/09/2016 16:31   US Abdomen Limited Ruq  Result Date: 07/09/2016 CLINICAL DATA:  Pancreatitis EXAM: ULTRASOUND ABDOMEN LIMITED RIGHT UPPER QUADRANT COMPARISON:  07/09/2016 FINDINGS: Gallbladder: No gallstones or wall thickening visualized. No sonographic Murphy sign noted by sonographer. Common bile duct: Diameter: 2 mm Liver: Increased echogenicity, heterogenous consistent with fatty infiltration. Unable to visualize the right posterior hepatic lobe CT lesion. Difficult visualization of flow in the portal vessels likely due to fatty infiltration. IMPRESSION: 1. Negative for gallstones or biliary dilatation 2. Coarse heterogeneous echogenic liver consistent with fatty infiltration. Difficult visualization of portal vessels, probably due to fatty infiltration ; follow-up contrast-enhanced CT could be obtained to confirm patency of the portal vein. 3. Previously described right hepatic lobe lesion is not well seen by ultrasound. Electronically Signed   By: Donavan Foil M.D.   On: 07/09/2016 22:58    Time Spent in minutes  30   Jani Gravel M.D on 07/14/2016 at 8:30 AM  Between 7am to 7pm - Pager - [phone]  After 7pm go to [email or web address] - password [facility]  [facility] -  Office  [phone]

## 2016-07-14 NOTE — Evaluation (Signed)
Physical Therapy Evaluation and discharge Patient Details Name: Terri Morris MRN: [id number] DOB: 09/26/1975 Today's Date: 07/14/2016   History of Present Illness  Pt is a 41 y/o female admitted secondary to ETOH induced pancreatitis which induced dCHF. PMH includes HTN, smoking, anxiety, and ETOH abuse.   Clinical Impression  Patient evaluated by Physical Therapy with no further acute PT needs identified. All education has been completed and the patient has no further questions. Pt overall steady with mobility and no LOB noted. Pt mod I to supervision with all tasks. HR elevated to 135 during ambulation, so limited distance, however, pt asymptomatic. Pt with no SOB during activity. Pt reports she is at baseline.  See below for any follow-up Physical Therapy or equipment needs. PT is signing off. Thank you for this referral. If needs change, please re-consult.      Follow Up Recommendations No PT follow up    Equipment Recommendations  None recommended by PT    Recommendations for Other Services       Precautions / Restrictions Precautions Precautions: None Restrictions Weight Bearing Restrictions: No      Mobility  Bed Mobility               General bed mobility comments: In bathroom upon entry   Transfers Overall transfer level: Modified independent Equipment used: None             General transfer comment: No assist needed. safe technique   Ambulation/Gait Ambulation/Gait assistance: Supervision Ambulation Distance (Feet): 200 Feet Assistive device: None Gait Pattern/deviations: Step-through pattern;Decreased stride length Gait velocity: WNL Gait velocity interpretation: at or above normal speed for age/gender General Gait Details: Steady gait with performance of dynamic balance activities. Pt HR elevated to 135, therefore limited tolerance, however, pt asymptomatic.   Stairs            Wheelchair Mobility    Modified Rankin (Stroke  Patients Only)       Balance Overall balance assessment: Needs assistance Sitting-balance support: No upper extremity supported;Feet supported Sitting balance-Leahy Scale: Good     Standing balance support: No upper extremity supported;During functional activity Standing balance-Leahy Scale: Good                   Standardized Balance Assessment Standardized Balance Assessment : Dynamic Gait Index   Dynamic Gait Index Level Surface: Normal Change in Gait Speed: Normal Gait with Horizontal Head Turns: Normal Gait with Vertical Head Turns: Normal Gait and Pivot Turn: Normal Step Over Obstacle: Normal Step Around Obstacles: Normal       Pertinent Vitals/Pain Pain Assessment: 0-10 Pain Score: 5  Pain Location: stomach  Pain Descriptors / Indicators: Sore;Sharp Pain Intervention(s): Limited activity within patient's tolerance;Monitored during session;Repositioned    Home Living Family/patient expects to be discharged to:: Private residence Living Arrangements: Spouse/significant other Available Help at Discharge: Family;Available PRN/intermittently Type of Home: House Home Access: Ramped entrance     Home Layout: One level Home Equipment: None      Prior Function Level of Independence: Independent         Comments: Working as Passenger transport manager.      Hand Dominance   Dominant Hand: Right    Extremity/Trunk Assessment   Upper Extremity Assessment Upper Extremity Assessment: Overall WFL for tasks assessed    Lower Extremity Assessment Lower Extremity Assessment: Overall WFL for tasks assessed    Cervical / Trunk Assessment Cervical / Trunk Assessment: Normal  Communication   Communication: No  difficulties  Cognition Arousal/Alertness: Awake/alert Behavior During Therapy: WFL for tasks assessed/performed Overall Cognitive Status: Within Functional Limits for tasks assessed                                         General Comments General comments (skin integrity, edema, etc.): Limited secondary to elevated HR (135 bpm), however, pt asymptomatic. Educated about importance of energy conservation and recognition of signs and symptoms of CHF.     Exercises     Assessment/Plan    PT Assessment Patent does not need any further PT services  PT Problem List         PT Treatment Interventions      PT Goals (Current goals can be found in the Care Plan section)  Acute Rehab PT Goals Patient Stated Goal: to go home  PT Goal Formulation: With patient Time For Goal Achievement: 07/14/16 Potential to Achieve Goals: Good    Frequency     Barriers to discharge        Co-evaluation               AM-PAC PT "6 Clicks" Daily Activity  Outcome Measure Difficulty turning over in bed (including adjusting bedclothes, sheets and blankets)?: None Difficulty moving from lying on back to sitting on the side of the bed? : None Difficulty sitting down on and standing up from a chair with arms (e.g., wheelchair, bedside commode, etc,.)?: None Help needed moving to and from a bed to chair (including a wheelchair)?: None Help needed walking in hospital room?: None Help needed climbing 3-5 steps with a railing? : A Little 6 Click Score: 23    End of Session Equipment Utilized During Treatment: Gait belt Activity Tolerance: Other (comment) (elevated HR ) Patient left: in chair;with call bell/phone within reach Nurse Communication: Mobility status PT Visit Diagnosis: Other abnormalities of gait and mobility (R26.89)    Time: 3335-4562 PT Time Calculation (min) (ACUTE ONLY): 17 min   Charges:   PT Evaluation $PT Eval Low Complexity: 1 Procedure     PT G Codes:        Leighton Ruff, PT, DPT  Acute Rehabilitation Services  Pager: [phone]   Rudean Hitt 07/14/2016, 2:16 PM

## 2016-07-14 NOTE — Progress Notes (Signed)
Patient ID: Terri Morris, female   DOB: October 31, 1975, 41 y.o.   MRN: [id number]     Advanced Heart Failure Rounding Note Primary Cardiologist: Terri Morris  Subjective:    Feeling better, although still with mild abd pain and DOE.   OK getting around the room, but SOB after showering.   Diet regressed back to clears last night with worsening abd pain with solids.   No weight this am. Good diuresis (Negative 2.2 L)  Objective:   Weight Range: 177 lb 4.8 oz (80.4 kg) Body mass index is 24.73 kg/m.   Vital Signs:   Temp:  [97.5 F (36.4 C)-98.6 F (37 C)] 97.5 F (36.4 C) (07/05 0453) Pulse Rate:  [91-108] 91 (07/05 0453) Resp:  [17-19] 18 (07/05 0453) BP: (108-146)/(83-105) 108/88 (07/05 1021) SpO2:  [98 %-100 %] 100 % (07/05 0453) Weight:  [177 lb 4.8 oz (80.4 kg)] 177 lb 4.8 oz (80.4 kg) (07/04 1701) Last BM Date: 07/13/16  Weight change: Filed Weights   07/12/16 0400 07/13/16 0330 07/13/16 1701  Weight: 180 lb (81.6 kg) 179 lb 9.6 oz (81.5 kg) 177 lb 4.8 oz (80.4 kg)    Intake/Output:   Intake/Output Summary (Last 24 hours) at 07/14/16 1042 Last data filed at 07/14/16 1004  Gross per 24 hour  Intake             1080 ml  Output             2700 ml  Net            -1620 ml      Physical Exam    General: Well appearing. No resp difficulty. HEENT: Normal Neck: Supple. JVP difficult to assess due to body habitus, Appears around 7-8 cm. Carotids 2+ bilat; no bruits. No thyromegaly or nodule noted. Cor: PMI nondisplaced. RRR, No M/G/R noted Lungs: CTAB, mildly diminished basilar sounds. . Abdomen: Soft, non-tender, non-distended, no HSM. No bruits or masses. +BS  Extremities: No cyanosis, clubbing, rash, R and LLE no edema.  Neuro: Alert & orientedx3, cranial nerves grossly intact. moves all 4 extremities w/o difficulty. Affect pleasant   Telemetry   Personally reviewed, NSR    Labs    CBC No results for input(s): WBC, NEUTROABS, HGB, HCT, MCV, PLT in the  last 72 hours. Basic Metabolic Panel  Recent Labs  07/12/16 0317  07/13/16 0227 07/14/16 0650  NA 137  --  138 134*  K 2.6*  < > 2.9* 4.2  CL 104  --  103 99*  CO2 23  --  26 28  GLUCOSE 128*  --  110* 149*  BUN <5*  --  <5* <5*  CREATININE 0.75  --  0.47 0.51  CALCIUM 8.4*  --  8.8* 8.9  MG 2.0  < > 1.6* 1.7  PHOS 1.1*  --   --   --   < > = values in this interval not displayed. Liver Function Tests  Recent Labs  07/13/16 0227 07/14/16 0650  AST 61* 124*  ALT 51 73*  ALKPHOS 54 55  BILITOT 1.3* 1.0  PROT 6.7 6.6  ALBUMIN 3.3* 3.5    Recent Labs  07/13/16 0227 07/14/16 0650  LIPASE 317* 214*   Cardiac Enzymes No results for input(s): CKTOTAL, CKMB, CKMBINDEX, TROPONINI in the last 72 hours.  BNP: BNP (last 3 results)  Recent Labs  07/12/16 1808  BNP 84.7    ProBNP (last 3 results) No results for input(s): PROBNP in the last  8760 hours.   D-Dimer No results for input(s): DDIMER in the last 72 hours. Hemoglobin A1C No results for input(s): HGBA1C in the last 72 hours. Fasting Lipid Panel No results for input(s): CHOL, HDL, LDLCALC, TRIG, CHOLHDL, LDLDIRECT in the last 72 hours. Thyroid Function Tests No results for input(s): TSH, T4TOTAL, T3FREE, THYROIDAB in the last 72 hours.  Invalid input(s): FREET3  Other results:   Imaging    No results found.   Medications:     Scheduled Medications: . amLODipine  10 mg Oral Daily  . aspirin EC  81 mg Oral Daily  . enoxaparin (LOVENOX) injection  40 mg Subcutaneous Q24H  . feeding supplement  1 Container Oral TID BM  . folic acid  1 mg Oral Daily  . insulin aspart  0-5 Units Subcutaneous QHS  . insulin aspart  0-9 Units Subcutaneous TID WC  . lactulose  30 g Oral BID  . lisinopril  20 mg Oral Daily  . multivitamin with minerals  1 tablet Oral Daily  . nicotine  21 mg Transdermal Daily  . pantoprazole  40 mg Oral Q1200  . thiamine  100 mg Oral Daily    Infusions:   PRN  Medications: acetaminophen, famotidine, hydrALAZINE, ondansetron (ZOFRAN) IV, oxyCODONE, pneumococcal 23 valent vaccine, traMADol, zolpidem  Patient Profile   Terri Morris is a 41 y.o. female with PMH of ETOH abuse, anxiety, HTN, HLD, and thyroid disease. Patient admitted for severe, central abdominal pain with N/V. 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 CHF team consulted for Echo with Grade 2 DD dysfunction and volume overload.   Assessment/Plan   1. Acute diastolic CHF: She was admitted with suspected ETOH pancreatitis.  For treatment of this, she got aggressive IV fluid. She developed acute diastolic CHF with hypoxemia in setting of volume infusion.  She is at risk due to history of hypertension.  - BP improved today.  - Echo showed EF normal with grade II diastolic dysfunction.   - Volume status remains mildly elevated with on-going DOE. Will repeat one dose 40 mg IV lasix today. Consider po tomorrow.  2. EtOH induced pancreatitis:   - PTA drinking 3 beers a day.  Aware that she needs to stop.  Abd pain improving.  3. HTN:  - BP improved. Continue current management.  4. Hypokalemia:  - K and stable.  5. Hypomagnesemia - Given 1 g yesterday with improvement to 1.7.  Will give 4 g today with IV lasix.   6. AKI:  - Resolved. Continue to follow.     Length of Stay: [address]  Terri Morris  07/14/2016, 10:42 AM  Advanced Heart Failure Team Pager [phone] (M-F; 7a - 4p)  Please contact [facility] for night-coverage after hours (4p -7a ) and weekends on [email or web address]  Patient seen with PA, agree with the above note.    Good diuresis yesterday, still some dyspnea walking in room today.  BP improved.   Will give 1 more dose of IV Lasix today, possibly to po tomorrow.  Replace K and Mg.   Terri Morris 07/14/2016 12:24 PM

## 2016-07-15 DIAGNOSIS — I5031 Acute diastolic (congestive) heart failure: Secondary | ICD-10-CM

## 2016-07-15 LAB — CBC
HCT: 38.3 % (ref 36.0–46.0)
Hemoglobin: 13 g/dL (ref 12.0–15.0)
MCH: 35.3 pg — AB (ref 26.0–34.0)
MCHC: 33.9 g/dL (ref 30.0–36.0)
MCV: 104.1 fL — AB (ref 78.0–100.0)
PLATELETS: 323 10*3/uL (ref 150–400)
RBC: 3.68 MIL/uL — ABNORMAL LOW (ref 3.87–5.11)
RDW: 16.6 % — AB (ref 11.5–15.5)
WBC: 8 10*3/uL (ref 4.0–10.5)

## 2016-07-15 LAB — COMPREHENSIVE METABOLIC PANEL
ALT: 111 U/L — ABNORMAL HIGH (ref 14–54)
ANION GAP: 8 (ref 5–15)
AST: 165 U/L — ABNORMAL HIGH (ref 15–41)
Albumin: 3.5 g/dL (ref 3.5–5.0)
Alkaline Phosphatase: 57 U/L (ref 38–126)
BUN: <5 mg/dL — ABNORMAL LOW (ref 6–20)
CHLORIDE: 97 mmol/L — AB (ref 101–111)
CO2: 29 mmol/L (ref 22–32)
Calcium: 9.1 mg/dL (ref 8.9–10.3)
Creatinine, Ser: 0.58 mg/dL (ref 0.44–1.00)
Glucose, Bld: 157 mg/dL — ABNORMAL HIGH (ref 65–99)
POTASSIUM: 3.8 mmol/L (ref 3.5–5.1)
SODIUM: 134 mmol/L — AB (ref 135–145)
Total Bilirubin: 0.9 mg/dL (ref 0.3–1.2)
Total Protein: 7.1 g/dL (ref 6.5–8.1)

## 2016-07-15 LAB — GLUCOSE, CAPILLARY
GLUCOSE-CAPILLARY: 158 mg/dL — AB (ref 65–99)
GLUCOSE-CAPILLARY: 140 mg/dL — AB (ref 65–99)
GLUCOSE-CAPILLARY: 124 mg/dL — AB (ref 65–99)
GLUCOSE-CAPILLARY: 184 mg/dL — AB (ref 65–99)

## 2016-07-15 LAB — CULTURE, BLOOD (ROUTINE X 2)
Culture: NO GROWTH
Culture: NO GROWTH
Special Requests: ADEQUATE

## 2016-07-15 LAB — MAGNESIUM: Magnesium: 2.6 mg/dL — ABNORMAL HIGH (ref 1.7–2.4)

## 2016-07-15 LAB — PROTIME-INR
INR: 1.08
PROTHROMBIN TIME: 14 s (ref 11.4–15.2)

## 2016-07-15 LAB — LIPASE, BLOOD: Lipase: 192 U/L — ABNORMAL HIGH (ref 11–51)

## 2016-07-15 LAB — AMMONIA: AMMONIA: 37 umol/L — AB (ref 9–35)

## 2016-07-15 MED ORDER — SODIUM CHLORIDE 0.9 % IV SOLN
INTRAVENOUS | Status: DC
  Administered 2016-07-15: 09:00:00 via INTRAVENOUS

## 2016-07-15 MED ORDER — FUROSEMIDE 40 MG PO TABS
40.0000 mg | ORAL_TABLET | Freq: Once | ORAL | Status: AC
  Administered 2016-07-15: 40 mg via ORAL
  Filled 2016-07-15: qty 1

## 2016-07-15 NOTE — Progress Notes (Signed)
Patient ID: Terri Morris, female   DOB: 1975-08-06, 41 y.o.   MRN: [id number]                                                                PROGRESS NOTE                                                                                                                                                                                                             Patient Demographics:    Terri Morris, is a 41 y.o. female, DOB - [birth year], HQI:[id number]  Admit date - 07/09/2016   Admitting Physician Ivor Costa, MD  Outpatient Primary MD for the patient is Saguier, Iris Pert  LOS - 6  Outpatient Specialists:     Chief Complaint  Patient presents with  . Abdominal Pain  . Nausea       Brief Narrative  41yo F HxEtOH abuse, Anxiety, HTN, HLD, and Thyroid disease who presented with 3 days of nausea, vomiting, and abdominal pain. In the ED she was found to have a Lipase of 1875, AST 168, ALT 136, total bilirubin 2.4 and acute renal injury with creatinine 2.96.     Subjective:    Terri Morris today has some abdominal pain.  Denies fever, chills, n/v, diarrhea, brbpr.    No headache, No chest pain, No abdominal pain - No Nausea, No new weakness tingling or numbness, No Cough - SOB.    Assessment  & Plan :    Principal Problem:   Acute pancreatitis Active Problems:   Hypertension   Smoking   DKA (diabetic ketoacidoses) (HCC)   AKI (acute kidney injury) ([place])   Hyperkalemia   Alcohol use   Metabolic acidosis   Acute respiratory failure with hypoxia (HCC)   Liver lesion   HLD (hyperlipidemia)   Acute diastolic CHF (congestive heart failure) (HCC)   Acute alcohol associated pancreatitis Slowly improving -  Full liquid diet due to abdominal pain Check lipase in am Hydrate with ns iv this morning, reassess in the afternoon.   Gerd Add pepcid  20mg  po qday prn  Alcohol abuse No evidence of acute withdrawal - pt has been counseled on absolute need to stop  EtOH entirely  Fatty infiltrate of the liver Noted on ultrasound of the abdomen 6/30 -  counseled pt on potential for this to progress to cirrhosis and absolute need to d/c EtOH use entirely   Toxic metabolic encephalopathy Multifactorial to include acute liver failure, acute pancreatitis, metabolic acidosis, EtOH withdrawal - resolved - cont lactulose   DKA? - ?newly diagnosed DM A1c 6.1 therefore NOT diagnostic for DM- CBG quite variable at this time - stop lantus for now and follow trend - this will need to be followed closely as an outpt as it is possible the pt will not require any medical tx once her pancreatic inflammation resolves   Acute renal failure Resolved w/ crt now normal   Nonspecific liver lesion Nonspecific 13 x 10 mm lesion in the right lobe noted on CT scan abdomen - no liver lesion noted on MRCP - follow-up liver protocol CT abdomen with contrast suggested in 3-6 months  Acute hypoxic respiratory failure Likely due to obtundation plus acute pancreatitis - resolved  Severe hypokalemia  improved Due to total body deficit, as well as diuretic use - replace and follow  Hypomagnesemia resolved  Tobacco abuse  HTN BP variable - meds being adjusted by CHF Team   Newly diagnosed grade 2 Diastolic CHF  Diuresing w/ improvement in sx      Code Status : FULL CODE  Family Communication  :  w patient  Disposition Plan  :  home  Barriers For Discharge :   Consults  :   none  Procedures  :    DVT Prophylaxis  :  Lovenox - SCDs   Recent Labs       Lab Results  Component Value Date   PLT 269 07/09/2016      Antibiotics  :       Anti-infectives    None      Lab Results  Component Value Date   PLT 323 07/15/2016        Objective:   Vitals:   07/14/16 2146 07/15/16 0531 07/15/16 0919 07/15/16 1300  BP: 98/68 112/74 109/72 102/69  Pulse: (!) 112 92  92  Resp: 18 20  18   Temp: 98.2 F (36.8 C) 98.3 F (36.8  C)  98.8 F (37.1 C)  TempSrc:  Oral  Oral  SpO2: 100% 100%  98%  Weight:  79.5 kg (175 lb 3.2 oz)    Height:        Wt Readings from Last 3 Encounters:  07/15/16 79.5 kg (175 lb 3.2 oz)  06/24/16 82.1 kg (181 lb)  01/12/16 92.7 kg (204 lb 7 oz)     Intake/Output Summary (Last 24 hours) at 07/15/16 1925 Last data filed at 07/15/16 0930  Gross per 24 hour  Intake              480 ml  Output             1200 ml  Net             -720 ml     Physical Exam  Awake Alert, Oriented X 3, No new F.N deficits, Normal affect [place].AT,PERRAL Supple Neck,No JVD, No cervical lymphadenopathy appriciated.  Symmetrical Chest wall movement, Good air movement bilaterally, CTAB RRR,No Gallops,Rubs or new Murmurs, No Parasternal Heave +ve B.Sounds, Abd Soft, No tenderness, No organomegaly appriciated, No rebound - guarding or rigidity. No Cyanosis, Clubbing or edema, No new Rash or bruise       Data Review:    CBC  Recent Labs Lab 07/09/16 1246 07/15/16 0449  WBC 9.4 8.0  HGB 16.9* 13.0  HCT 49.5* 38.3  PLT 269 323  MCV 109.8* 104.1*  MCH 37.5* 35.3*  MCHC 34.1 33.9  RDW 17.8* 16.6*  LYMPHSABS 1.0  --   MONOABS 1.0  --   EOSABS 0.0  --   BASOSABS 0.0  --     Chemistries   Recent Labs Lab 07/11/16 0239 07/12/16 0317 07/12/16 1321 07/12/16 1808 07/13/16 0227 07/14/16 0650 07/15/16 0449  NA 139 137  --   --  138 134* 134*  K 2.9* 2.6* 2.6* 3.2* 2.9* 4.2 3.8  CL 108 104  --   --  103 99* 97*  CO2 18* 23  --   --  26 28 29   GLUCOSE 123* 128*  --   --  110* 149* 157*  BUN 10 <5*  --   --  <5* <5* <5*  CREATININE 1.18* 0.75  --   --  0.47 0.51 0.58  CALCIUM 8.3* 8.4*  --   --  8.8* 8.9 9.1  MG 1.6* 2.0  --  1.8 1.6* 1.7 2.6*  AST 60* 54*  --   --  61* 124* 165*  ALT 58* 49  --   --  51 73* 111*  ALKPHOS 48 45  --   --  54 55 57  BILITOT 1.9* 1.7*  --   --  1.3* 1.0 0.9    ------------------------------------------------------------------------------------------------------------------ No results for input(s): CHOL, HDL, LDLCALC, TRIG, CHOLHDL, LDLDIRECT in the last 72 hours.  Lab Results  Component Value Date   HGBA1C 6.1 (H) 07/10/2016   ------------------------------------------------------------------------------------------------------------------ No results for input(s): TSH, T4TOTAL, T3FREE, THYROIDAB in the last 72 hours.  Invalid input(s): FREET3 ------------------------------------------------------------------------------------------------------------------ No results for input(s): VITAMINB12, FOLATE, FERRITIN, TIBC, IRON, RETICCTPCT in the last 72 hours.  Coagulation profile  Recent Labs Lab 07/11/16 0239 07/12/16 0317 07/13/16 0227 07/14/16 0650 07/15/16 0449  INR 1.16 1.25 1.11 1.08 1.08    No results for input(s): DDIMER in the last 72 hours.  Cardiac Enzymes No results for input(s): CKMB, TROPONINI, MYOGLOBIN in the last 168 hours.  Invalid input(s): CK ------------------------------------------------------------------------------------------------------------------    Component Value Date/Time   BNP 84.7 07/12/2016 1808    Inpatient Medications  Scheduled Meds: . amLODipine  10 mg Oral Daily  . aspirin EC  81 mg Oral Daily  . enoxaparin (LOVENOX) injection  40 mg Subcutaneous Q24H  . feeding supplement (GLUCERNA SHAKE)  237 mL Oral TID BM  . folic acid  1 mg Oral Daily  . insulin aspart  0-5 Units Subcutaneous QHS  . insulin aspart  0-9 Units Subcutaneous TID WC  . lactulose  30 g Oral BID  . lisinopril  20 mg Oral Daily  . multivitamin with minerals  1 tablet Oral Daily  . nicotine  21 mg Transdermal Daily  . pantoprazole  40 mg Oral Q1200  . thiamine  100 mg Oral Daily   Continuous Infusions: PRN Meds:.acetaminophen, famotidine, hydrALAZINE, ondansetron (ZOFRAN) IV, oxyCODONE, pneumococcal 23 valent  vaccine, traMADol, zolpidem  Micro Results Recent Results (from the past 240 hour(s))  MRSA PCR Screening     Status: None   Collection Time: 07/10/16  6:59 AM  Result Value Ref Range Status   MRSA by PCR NEGATIVE NEGATIVE Final    Comment:        The GeneXpert MRSA Assay (FDA approved for NASAL specimens only), is one component of a comprehensive MRSA colonization surveillance program. It is not intended to diagnose MRSA infection nor to  guide or monitor treatment for MRSA infections.   Culture, blood (routine x 2)     Status: None   Collection Time: 07/10/16  8:12 PM  Result Value Ref Range Status   Specimen Description BLOOD RIGHT ANTECUBITAL  Final   Special Requests   Final    BOTTLES DRAWN AEROBIC AND ANAEROBIC Blood Culture adequate volume   Culture NO GROWTH 5 DAYS  Final   Report Status 07/15/2016 FINAL  Final  Culture, blood (routine x 2)     Status: None   Collection Time: 07/10/16  8:21 PM  Result Value Ref Range Status   Specimen Description BLOOD LEFT ANTECUBITAL  Final   Special Requests   Final    BOTTLES DRAWN AEROBIC AND ANAEROBIC Blood Culture results may not be optimal due to an excessive volume of blood received in culture bottles   Culture NO GROWTH 5 DAYS  Final   Report Status 07/15/2016 FINAL  Final  Culture, Urine     Status: Abnormal   Collection Time: 07/10/16  8:24 PM  Result Value Ref Range Status   Specimen Description URINE, RANDOM  Final   Special Requests NONE  Final   Culture MULTIPLE SPECIES PRESENT, SUGGEST RECOLLECTION (A)  Final   Report Status 07/12/2016 FINAL  Final    Radiology Reports Dg Chest 2 View  Result Date: 07/09/2016 CLINICAL DATA:  Admitted for acute pancreatitis with shortness of breath and oxygen desaturation. EXAM: CHEST  2 VIEW COMPARISON:  05/29/2015 chest radiograph. FINDINGS: Stable heart size and mediastinal contours are within normal limits. Both lungs are clear. The visualized skeletal structures are  unremarkable. IMPRESSION: No acute pulmonary process identified. Electronically Signed   By: Kristine Garbe M.D.   On: 07/09/2016 22:09   US Transvaginal Non-ob  Result Date: 07/09/2016 CLINICAL DATA:  41 year old female with left lower quadrant and suprapubic pain for 3-4 days. Negative serum pregnancy test today. Premenopausal. LMP 5 weeks ago. EXAM: TRANSABDOMINAL AND TRANSVAGINAL ULTRASOUND OF PELVIS DOPPLER ULTRASOUND OF OVARIES TECHNIQUE: Both transabdominal and transvaginal ultrasound examinations of the pelvis were performed. Transabdominal technique was performed for global imaging of the pelvis including uterus, ovaries, adnexal regions, and pelvic cul-de-sac. It was necessary to proceed with endovaginal exam following the transabdominal exam to visualize the ovaries. Color and duplex Doppler ultrasound was utilized to evaluate blood flow to the ovaries. COMPARISON:  Ob ultrasound 11/03/2009. FINDINGS: Uterus Measurements: 6.9 x 3.4 x 4.4 cm. Small intramural 9 mm fibroid at the posterior body of the uterus (image 59). Larger 2.5 cm pedunculated subserosal fibroid at the anterior fundus directed toward the left (image 63). There is some vascularity detected within this fibroid (images 65 and 79). Endometrium Thickness: 4 mm. No focal abnormality visualized. Incidental nabothian cysts at the cervix. Right ovary Only visible trans abdominally. Measurements: 2.5 x 2.8 x 2.2 cm. Simple appearing 19 mm cyst associated with the right ovary (image 26). Left ovary Only visible trans abdominally. Measurements: 3.0 x 2.0 x 1.6 cm. Normal appearance/no adnexal mass. Pulsed Doppler evaluation of both ovaries was difficult to the ovaries could not be visualized with transvaginal technique. Color Doppler flow is evident in both ovaries (right image 19 and left image 31) but spectral imaging could not be obtained. Other findings No abnormal free fluid. IMPRESSION: 1. No strong evidence of ovarian torsion,  but Doppler evaluation of both ovaries was difficult and suboptimal. 2. Pedunculated 2.5 cm subserosal fibroid directed to the left from the anterior fundus. Smaller 9 mm intramural  fibroid at the posterior body. 3. No pelvic free fluid. Electronically Signed   By: Genevie Ann M.D.   On: 07/09/2016 15:31   US Pelvis Complete  Result Date: 07/09/2016 CLINICAL DATA:  41 year old female with left lower quadrant and suprapubic pain for 3-4 days. Negative serum pregnancy test today. Premenopausal. LMP 5 weeks ago. EXAM: TRANSABDOMINAL AND TRANSVAGINAL ULTRASOUND OF PELVIS DOPPLER ULTRASOUND OF OVARIES TECHNIQUE: Both transabdominal and transvaginal ultrasound examinations of the pelvis were performed. Transabdominal technique was performed for global imaging of the pelvis including uterus, ovaries, adnexal regions, and pelvic cul-de-sac. It was necessary to proceed with endovaginal exam following the transabdominal exam to visualize the ovaries. Color and duplex Doppler ultrasound was utilized to evaluate blood flow to the ovaries. COMPARISON:  Ob ultrasound 11/03/2009. FINDINGS: Uterus Measurements: 6.9 x 3.4 x 4.4 cm. Small intramural 9 mm fibroid at the posterior body of the uterus (image 59). Larger 2.5 cm pedunculated subserosal fibroid at the anterior fundus directed toward the left (image 63). There is some vascularity detected within this fibroid (images 65 and 79). Endometrium Thickness: 4 mm. No focal abnormality visualized. Incidental nabothian cysts at the cervix. Right ovary Only visible trans abdominally. Measurements: 2.5 x 2.8 x 2.2 cm. Simple appearing 19 mm cyst associated with the right ovary (image 26). Left ovary Only visible trans abdominally. Measurements: 3.0 x 2.0 x 1.6 cm. Normal appearance/no adnexal mass. Pulsed Doppler evaluation of both ovaries was difficult to the ovaries could not be visualized with transvaginal technique. Color Doppler flow is evident in both ovaries (right image 19 and  left image 31) but spectral imaging could not be obtained. Other findings No abnormal free fluid. IMPRESSION: 1. No strong evidence of ovarian torsion, but Doppler evaluation of both ovaries was difficult and suboptimal. 2. Pedunculated 2.5 cm subserosal fibroid directed to the left from the anterior fundus. Smaller 9 mm intramural fibroid at the posterior body. 3. No pelvic free fluid. Electronically Signed   By: Genevie Ann M.D.   On: 07/09/2016 15:31   [place] Art/ven Flow Abd Pelv Doppler  Result Date: 07/09/2016 CLINICAL DATA:  41 year old female with left lower quadrant and suprapubic pain for 3-4 days. Negative serum pregnancy test today. Premenopausal. LMP 5 weeks ago. EXAM: TRANSABDOMINAL AND TRANSVAGINAL ULTRASOUND OF PELVIS DOPPLER ULTRASOUND OF OVARIES TECHNIQUE: Both transabdominal and transvaginal ultrasound examinations of the pelvis were performed. Transabdominal technique was performed for global imaging of the pelvis including uterus, ovaries, adnexal regions, and pelvic cul-de-sac. It was necessary to proceed with endovaginal exam following the transabdominal exam to visualize the ovaries. Color and duplex Doppler ultrasound was utilized to evaluate blood flow to the ovaries. COMPARISON:  Ob ultrasound 11/03/2009. FINDINGS: Uterus Measurements: 6.9 x 3.4 x 4.4 cm. Small intramural 9 mm fibroid at the posterior body of the uterus (image 59). Larger 2.5 cm pedunculated subserosal fibroid at the anterior fundus directed toward the left (image 63). There is some vascularity detected within this fibroid (images 65 and 79). Endometrium Thickness: 4 mm. No focal abnormality visualized. Incidental nabothian cysts at the cervix. Right ovary Only visible trans abdominally. Measurements: 2.5 x 2.8 x 2.2 cm. Simple appearing 19 mm cyst associated with the right ovary (image 26). Left ovary Only visible trans abdominally. Measurements: 3.0 x 2.0 x 1.6 cm. Normal appearance/no adnexal mass. Pulsed Doppler evaluation  of both ovaries was difficult to the ovaries could not be visualized with transvaginal technique. Color Doppler flow is evident in both ovaries (right image 19 and left  image 31) but spectral imaging could not be obtained. Other findings No abnormal free fluid. IMPRESSION: 1. No strong evidence of ovarian torsion, but Doppler evaluation of both ovaries was difficult and suboptimal. 2. Pedunculated 2.5 cm subserosal fibroid directed to the left from the anterior fundus. Smaller 9 mm intramural fibroid at the posterior body. 3. No pelvic free fluid. Electronically Signed   By: Genevie Ann M.D.   On: 07/09/2016 15:31   Dg Chest Port 1 View  Result Date: 07/10/2016 CLINICAL DATA:  Acute respiratory failure. EXAM: PORTABLE CHEST 1 VIEW COMPARISON:  07/09/2016 FINDINGS: Cardiomediastinal silhouette is normal. Mediastinal contours appear intact. There is no evidence of focal airspace consolidation, pleural effusion or pneumothorax. Mild volume loss with streaky airspace opacity in the left lung base. Osseous structures are without acute abnormality. Soft tissues are grossly normal. IMPRESSION: Mild volume loss in the left lung base, otherwise unremarkable. Electronically Signed   By: Fidela Salisbury M.D.   On: 07/10/2016 19:16   Mr Abdomen Mrcp Moise Boring Contast  Result Date: 07/11/2016 CLINICAL DATA:  Liver mass on CT, evaluate for hepatocellular carcinoma EXAM: MRI ABDOMEN WITHOUT AND WITH CONTRAST (INCLUDING MRCP) TECHNIQUE: Multiplanar multisequence MR imaging of the abdomen was performed both before and after the administration of intravenous contrast. Heavily T2-weighted images of the biliary and pancreatic ducts were obtained, and three-dimensional MRCP images were rendered by post processing. CONTRAST:  51mL MULTIHANCE GADOBENATE DIMEGLUMINE 529 MG/ML IV SOLN COMPARISON:  CT abdomen/ pelvis dated 07/09/2016 FINDINGS: Motion degraded images. Lower chest: Lung bases are essentially clear. Hepatobiliary: Severe  hepatic steatosis. Apparent heterogeneous perfusion of the posterior right hepatic lobe is favored to reflect phase encoding artifact (for example, series 2302/image 30). No suspicious/enhancing hepatic lesions. The focal hypoenhancing lesion on CT is not evident on MRI, although this may be related to technical limitations. Mildly wavy hepatic contour, without definite findings to suggest cirrhosis. Gallbladder is unremarkable.  No evidence of cholelithiasis. No intrahepatic or extrahepatic ductal dilatation. Common duct measures 3 mm. No choledocholithiasis is seen. Pancreas: Peripancreatic inflammatory changes/ fluid, reflecting acute pancreatitis. No underlying pancreatic mass. No pancreatic ductal dilatation. No evidence of pseudocyst. Spleen:  Within normal limits. Adrenals/Urinary Tract: 1.8 cm left adrenal nodule (series 18/ image 16) with suspected signal loss on opposed phase imaging, compatible with a benign adrenal adenoma. Right adrenal glands within normal limits. Kidneys are within normal limits.  No hydronephrosis. Stomach/Bowel: Stomach and visualized bowel are unremarkable. Vascular/Lymphatic:  No evidence of abdominal aortic aneurysm. No suspicious abdominal lymphadenopathy. Other: Small volume upper abdominal ascites/mesenteric fluid, related to acute pancreatitis. Musculoskeletal: No focal osseous lesions. IMPRESSION: Focal hepatic lesion on CT is not evident on MRI, although this may be related to technical limitations. Regardless, this is not considered concerning for hepatocellular carcinoma given the lack of cirrhosis, in the absence of known hepatitis B. Consider follow-up liver protocol CT abdomen with/without contrast in 3-6 months for further characterization. Underlying severe hepatic steatosis. 1.8 cm left adrenal nodule, favoring a benign adrenal adenoma. Acute uncomplicated pancreatitis. No cholelithiasis or choledocholithiasis is seen. Common duct measures 3 mm. Electronically  Signed   By: Julian Hy M.D.   On: 07/11/2016 07:12   Ct Renal Stone Study  Result Date: 07/09/2016 CLINICAL DATA:  Nausea, vomiting, and diarrhea for 3 days, elevated lipase, acute kidney injury, elevated bilirubin, history hypertension EXAM: CT ABDOMEN AND PELVIS WITHOUT CONTRAST TECHNIQUE: Multidetector CT imaging of the abdomen and pelvis was performed following the standard protocol without IV contrast.  Sagittal and coronal MPR images reconstructed from axial data set. Oral contrast was not administered. COMPARISON:  None FINDINGS: Lower chest: Lung bases clear Hepatobiliary: Diffuse fatty infiltration of liver. Nonspecific low-attenuation focus posterior RIGHT lobe liver image 26, 13 x 10 mm. Intermediate attenuation material within gallbladder which could represent sludge or numerous tiny calculi. No biliary dilatation or additional hepatic abnormalities Pancreas: Pancreas is enlarged with poorly defined peripancreatic planes compatible with acute pancreatitis. No discrete pancreatic mass or fluid collection. Spleen: Normal appearance Adrenals/Urinary Tract: Thickening of both adrenal glands with question mass at LEFT adrenal gland 19 x 14 mm image 20. Kidneys, ureters, and bladder normal appearance. Stomach/Bowel: Normal appendix. Stomach and bowel loops normal appearance Vascular/Lymphatic: Aorta normal caliber with atherosclerotic calcification at bifurcation. No adenopathy. Few scattered pelvic phleboliths. Reproductive: Exophytic leiomyoma at LEFT uterus, 2.7 x 1.9 x 1.7 cm. Uterus and adnexa otherwise unremarkable. Other: Umbilical hernia containing fat.  No free air or free fluid. Musculoskeletal: No acute osseous findings. IMPRESSION: Acute pancreatitis without peripancreatic fluid collection or pancreatic mass. Fatty infiltration of liver with a nonspecific 13 x 10 mm lesion in the RIGHT lobe. Intermediate attenuation material within gallbladder question sludge or stones, could potentially  represent vicarious excretion of contrast material if patient has had recent contrast administration. 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 Small uterine leiomyoma. Umbilical hernia containing fat. Electronically Signed   By: Lavonia Dana M.D.   On: 07/09/2016 16:31   US Abdomen Limited Ruq  Result Date: 07/09/2016 CLINICAL DATA:  Pancreatitis EXAM: ULTRASOUND ABDOMEN LIMITED RIGHT UPPER QUADRANT COMPARISON:  07/09/2016 FINDINGS: Gallbladder: No gallstones or wall thickening visualized. No sonographic Murphy sign noted by sonographer. Common bile duct: Diameter: 2 mm Liver: Increased echogenicity, heterogenous consistent with fatty infiltration. Unable to visualize the right posterior hepatic lobe CT lesion. Difficult visualization of flow in the portal vessels likely due to fatty infiltration. IMPRESSION: 1. Negative for gallstones or biliary dilatation 2. Coarse heterogeneous echogenic liver consistent with fatty infiltration. Difficult visualization of portal vessels, probably due to fatty infiltration ; follow-up contrast-enhanced CT could be obtained to confirm patency of the portal vein. 3. Previously described right hepatic lobe lesion is not well seen by ultrasound. Electronically Signed   By: Donavan Foil M.D.   On: 07/09/2016 22:58    Time Spent in minutes  30   Jani Gravel M.D on 07/15/2016 at 7:25 PM  Between 7am to 7pm - Pager - [phone]  After 7pm go to [email or web address] - password [facility]  [facility] -  Office  [phone]

## 2016-07-15 NOTE — Progress Notes (Signed)
Patient ID: Terri Morris, female   DOB: 04/25/1975, 41 y.o.   MRN: [id number]     Advanced Heart Failure Rounding Note  Primary Cardiologist: Aundra Dubin  Subjective:    Abdominal pain better. Diet up to full liquids. Able to shower this am without DOE.    Weight down 2 lbs from several days ago. Back to discharge weight. Negative 720 cc with IV lasix yesterday.   Objective:   Weight Range: 175 lb 3.2 oz (79.5 kg) Body mass index is 24.44 kg/m.   Vital Signs:   Temp:  [98.2 F (36.8 C)-98.9 F (37.2 C)] 98.3 F (36.8 C) (07/06 0531) Pulse Rate:  [92-112] 92 (07/06 0531) Resp:  [18-20] 20 (07/06 0531) BP: (98-119)/(68-74) 109/72 (07/06 0919) SpO2:  [100 %] 100 % (07/06 0531) Weight:  [175 lb 3.2 oz (79.5 kg)] 175 lb 3.2 oz (79.5 kg) (07/06 0531) Last BM Date: 07/14/16  Weight change: Filed Weights   07/13/16 0330 07/13/16 1701 07/15/16 0531  Weight: 179 lb 9.6 oz (81.5 kg) 177 lb 4.8 oz (80.4 kg) 175 lb 3.2 oz (79.5 kg)    Intake/Output:   Intake/Output Summary (Last 24 hours) at 07/15/16 1029 Last data filed at 07/15/16 0142  Gross per 24 hour  Intake              240 ml  Output             1200 ml  Net             -960 ml      Physical Exam    General: Well appearing. No resp difficulty. HEENT: Normal Neck: Supple. JVP 7-8 cm. Carotids 2+ bilat; no bruits. No thyromegaly or nodule noted. Cor: PMI nondisplaced. RRR, No M/G/R noted Lungs: CTAB, normal effort. Abdomen: Soft, non-tender, non-distended, no HSM. No bruits or masses. +BS  Extremities: No cyanosis, clubbing, rash, R and LLE no edema.  Neuro: Alert & orientedx3, cranial nerves grossly intact. moves all 4 extremities w/o difficulty. Affect pleasant   Telemetry   Personally reviewed, NSR      Labs    CBC  Recent Labs  07/15/16 0449  WBC 8.0  HGB 13.0  HCT 38.3  MCV 104.1*  PLT 409   Basic Metabolic Panel  Recent Labs  07/14/16 0650 07/15/16 0449  NA 134* 134*  K 4.2 3.8  CL  99* 97*  CO2 28 29  GLUCOSE 149* 157*  BUN <5* <5*  CREATININE 0.51 0.58  CALCIUM 8.9 9.1  MG 1.7 2.6*   Liver Function Tests  Recent Labs  07/14/16 0650 07/15/16 0449  AST 124* 165*  ALT 73* 111*  ALKPHOS 55 57  BILITOT 1.0 0.9  PROT 6.6 7.1  ALBUMIN 3.5 3.5    Recent Labs  07/14/16 0650 07/15/16 0449  LIPASE 214* 192*   Cardiac Enzymes No results for input(s): CKTOTAL, CKMB, CKMBINDEX, TROPONINI in the last 72 hours.  BNP: BNP (last 3 results)  Recent Labs  07/12/16 1808  BNP 84.7    ProBNP (last 3 results) No results for input(s): PROBNP in the last 8760 hours.   D-Dimer No results for input(s): DDIMER in the last 72 hours. Hemoglobin A1C No results for input(s): HGBA1C in the last 72 hours. Fasting Lipid Panel No results for input(s): CHOL, HDL, LDLCALC, TRIG, CHOLHDL, LDLDIRECT in the last 72 hours. Thyroid Function Tests No results for input(s): TSH, T4TOTAL, T3FREE, THYROIDAB in the last 72 hours.  Invalid input(s): FREET3  Other results:   Imaging    No results found.   Medications:     Scheduled Medications: . amLODipine  10 mg Oral Daily  . aspirin EC  81 mg Oral Daily  . enoxaparin (LOVENOX) injection  40 mg Subcutaneous Q24H  . feeding supplement (GLUCERNA SHAKE)  237 mL Oral TID BM  . folic acid  1 mg Oral Daily  . insulin aspart  0-5 Units Subcutaneous QHS  . insulin aspart  0-9 Units Subcutaneous TID WC  . lactulose  30 g Oral BID  . lisinopril  20 mg Oral Daily  . multivitamin with minerals  1 tablet Oral Daily  . nicotine  21 mg Transdermal Daily  . pantoprazole  40 mg Oral Q1200  . thiamine  100 mg Oral Daily    Infusions: . sodium chloride 100 mL/hr at 07/15/16 0927    PRN Medications: acetaminophen, famotidine, hydrALAZINE, ondansetron (ZOFRAN) IV, oxyCODONE, pneumococcal 23 valent vaccine, traMADol, zolpidem  Patient Profile   Terri Morris is a 41 y.o. female with PMH of ETOH abuse, anxiety, HTN,  HLD, and thyroid disease. Patient admitted for severe, central abdominal pain with N/V. 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 CHF team consulted for Echo with Grade 2 DD dysfunction and volume overload.   Assessment/Plan   1. Acute diastolic CHF: She was admitted with suspected ETOH pancreatitis.  For treatment of this, she got aggressive IV fluid. She developed acute diastolic CHF with hypoxemia in setting of volume infusion.  She is at risk due to history of hypertension.  - BP stable on current meds.  - Echo showed EF normal with grade II diastolic dysfunction.   - Volume status improved. Only very mildly elevated in setting of NS gtt this am. Will give lasix 40 mg po once. And use PRN for home.  2. EtOH induced pancreatitis:   - PTA drinking 3 beers a day.  Aware that she needs to stop.  Abd pain much improved - Discussed plan with IM. Plan to discharge, likely today.   3. HTN:  - BP stable on current regimen.  4. Hypokalemia:  - Will supp gently today with dose of po lasix.  5. Hypomagnesemia - Resolved. No further supp.  6. AKI:  - Resolved. Continue to follow.      HF meds for home Lasix 40 mg AS NEEDED for weight gain of 3 lbs overnight or 5 lbs within one week. Lisinopril 20 mg daily AmLODipine 10 mg   We will see in clinic to make sure she is stable, and then graduate from AHF clinic to [facility] in the coming months.   Length of Stay: [address]  [name]  07/15/2016, 10:29 AM  Advanced Heart Failure Team Pager [phone] (M-F; 7a - 4p)  Please contact [facility] for night-coverage after hours (4p -7a ) and weekends on [email or web address]  Patient seen with PA, agree with the above note.  Volume status looks better.  BP is controlled.  Suspect acute diastolic CHF occurred in the setting of volume load with IV fluid for pancreatitis, so probably will not need standing Lasix at home.  Agree with prn po Lasix and continue antihypertensives.    Will arrange cardiology  followup.   Loralie Champagne 07/15/2016

## 2016-07-16 ENCOUNTER — Inpatient Hospital Stay (HOSPITAL_COMMUNITY): Payer: PRIVATE HEALTH INSURANCE

## 2016-07-16 DIAGNOSIS — R945 Abnormal results of liver function studies: Secondary | ICD-10-CM

## 2016-07-16 DIAGNOSIS — K7689 Other specified diseases of liver: Secondary | ICD-10-CM

## 2016-07-16 LAB — GLUCOSE, CAPILLARY
GLUCOSE-CAPILLARY: 161 mg/dL — AB (ref 65–99)
GLUCOSE-CAPILLARY: 148 mg/dL — AB (ref 65–99)
Glucose-Capillary: 126 mg/dL — ABNORMAL HIGH (ref 65–99)
Glucose-Capillary: 175 mg/dL — ABNORMAL HIGH (ref 65–99)

## 2016-07-16 LAB — COMPREHENSIVE METABOLIC PANEL
ALBUMIN: 3.3 g/dL — AB (ref 3.5–5.0)
ALK PHOS: 60 U/L (ref 38–126)
ALT: 156 U/L — AB (ref 14–54)
AST: 236 U/L — ABNORMAL HIGH (ref 15–41)
Anion gap: 10 (ref 5–15)
BUN: <5 mg/dL — ABNORMAL LOW (ref 6–20)
CALCIUM: 9 mg/dL (ref 8.9–10.3)
CO2: 28 mmol/L (ref 22–32)
CREATININE: 0.6 mg/dL (ref 0.44–1.00)
Chloride: 96 mmol/L — ABNORMAL LOW (ref 101–111)
GFR calc Af Amer: >60 mL/min (ref >60)
GFR calc non Af Amer: >60 mL/min (ref >60)
GLUCOSE: 164 mg/dL — AB (ref 65–99)
Potassium: 3.7 mmol/L (ref 3.5–5.1)
SODIUM: 134 mmol/L — AB (ref 135–145)
Total Bilirubin: 0.9 mg/dL (ref 0.3–1.2)
Total Protein: 6.7 g/dL (ref 6.5–8.1)

## 2016-07-16 LAB — CBC
HCT: 36.4 % (ref 36.0–46.0)
Hemoglobin: 12.5 g/dL (ref 12.0–15.0)
MCH: 35.6 pg — ABNORMAL HIGH (ref 26.0–34.0)
MCHC: 34.3 g/dL (ref 30.0–36.0)
MCV: 103.7 fL — ABNORMAL HIGH (ref 78.0–100.0)
Platelets: 382 10*3/uL (ref 150–400)
RBC: 3.51 MIL/uL — AB (ref 3.87–5.11)
RDW: 16.5 % — ABNORMAL HIGH (ref 11.5–15.5)
WBC: 8.8 10*3/uL (ref 4.0–10.5)

## 2016-07-16 LAB — RAPID URINE DRUG SCREEN, HOSP PERFORMED
Amphetamines: NOT DETECTED
BENZODIAZEPINES: NOT DETECTED
Barbiturates: NOT DETECTED
Cocaine: NOT DETECTED
OPIATES: POSITIVE — AB
TETRAHYDROCANNABINOL: NOT DETECTED

## 2016-07-16 LAB — PROTIME-INR
INR: 1.08
Prothrombin Time: 14 seconds (ref 11.4–15.2)

## 2016-07-16 LAB — ETHANOL: Alcohol, Ethyl (B): <5 mg/dL (ref <5)

## 2016-07-16 LAB — MAGNESIUM: MAGNESIUM: 2 mg/dL (ref 1.7–2.4)

## 2016-07-16 LAB — LIPASE, BLOOD: Lipase: 175 U/L — ABNORMAL HIGH (ref 11–51)

## 2016-07-16 LAB — AMMONIA: Ammonia: 36 umol/L — ABNORMAL HIGH (ref 9–35)

## 2016-07-16 MED ORDER — INSULIN ASPART 100 UNIT/ML ~~LOC~~ SOLN: 0.0000 [IU] | Freq: Three times a day (TID) | SUBCUTANEOUS | Status: DC

## 2016-07-16 MED ORDER — INSULIN ASPART 100 UNIT/ML ~~LOC~~ SOLN
0.0000 [IU] | Freq: Three times a day (TID) | SUBCUTANEOUS | Status: DC
  Administered 2016-07-16: 1 [IU] via SUBCUTANEOUS
  Administered 2016-07-16: 2 [IU] via SUBCUTANEOUS
  Administered 2016-07-16 – 2016-07-17 (×2): 1 [IU] via SUBCUTANEOUS

## 2016-07-16 NOTE — Progress Notes (Signed)
Patient ID: Terri Morris, female   DOB: [birth year], 41 y.o.   MRN: [id number]                                                                PROGRESS NOTE                                                                                                                                                                                                             Patient Demographics:    Terri Morris, is a 41 y.o. female, DOB - 13-Jan-1975, IPJ:[id number]  Admit date - 07/09/2016   Admitting Physician Ivor Costa, MD  Outpatient Primary MD for the patient is Saguier, Iris Pert  LOS - 7  Outpatient Specialists:  Chief Complaint  Patient presents with  . Abdominal Pain  . Nausea       Brief Narrative     41yo F HxEtOH abuse, Anxiety, HTN, HLD, and Thyroid disease who presented with 3 days of nausea, vomiting, and abdominal pain. In the ED she was found to have a Lipase of 1875, AST 168, ALT 136, total bilirubin 2.4 and acute renal injury with creatinine 2.96.     Subjective:    Terri Morris today feels like abdominal pain improving.  Pt tolerated full liquid diet yesterday.  Denies fever, chills, n/v, diarrhea.    No headache, No chest pain, No new weakness tingling or numbness, No Cough - SOB.    Assessment  & Plan :    Principal Problem:   Acute pancreatitis Active Problems:   Hypertension   Smoking   DKA (diabetic ketoacidoses) (HCC)   AKI (acute kidney injury) ([place])   Hyperkalemia   Alcohol use   Metabolic acidosis   Acute respiratory failure with hypoxia (HCC)   Liver lesion   HLD (hyperlipidemia)   Acute diastolic CHF (congestive heart failure) (HCC)   Abnormal liver function Worsening  Check ETOH Check urine drug screen Repeat RUQ ultrasound  Acute alcohol associated pancreatitis Slowly improving -  Full liquid diet due to abdominal pain Check lipase in am  Gerd Add pepcid  20mg  po qday prn  Alcohol abuse No evidence of acute  withdrawal - pt has been counseled on absolute need to stop EtOH entirely  Fatty infiltrate of the liver Noted on ultrasound  of the abdomen 6/30 - counseled pt on potential for this to progress to cirrhosis and absolute need to d/c EtOH use entirely   Toxic metabolic encephalopathy Multifactorial to include acute liver failure, acute pancreatitis, metabolic acidosis, EtOH withdrawal - resolved - cont lactulose   DKA? - ?newly diagnosed DM A1c 6.1 therefore NOT diagnostic for DM- CBG quite variable at this time - stop lantus for now and follow trend - this will need to be followed closely as an outpt as it is possible the pt will not require any medical tx once her pancreatic inflammation resolves   Acute renal failure Resolved w/ crt now normal   Nonspecific liver lesion Nonspecific 13 x 10 mm lesion in the right lobe noted on CT scan abdomen - no liver lesion noted on MRCP - follow-up liver protocol CT abdomen with contrast suggested in 3-6 months  Acute hypoxic respiratory failure Likely due to obtundation plus acute pancreatitis - resolved  Severe hypokalemia  improved Due to total body deficit, as well as diuretic use - replace and follow Resolved, check cmp in am  Hypomagnesemia resolved  Tobacco abuse  HTN BP variable - meds being adjusted by CHF Team   Newly diagnosed grade 2 Diastolic CHF  Diuresing w/ improvement in sx  Appreciate cardiology input    Code Status : FULL CODE  Family Communication  :  w patient  Disposition Plan  :  home  Barriers For Discharge :   Consults  :   none  Procedures  :    DVT Prophylaxis  :  Lovenox - SCDs       Lab Results  Component Value Date   PLT 382 07/16/2016      Anti-infectives    None        Objective:   Vitals:   07/15/16 2200 07/16/16 0203 07/16/16 0456 07/16/16 0501  BP: 111/67 (!) 90/59 (!) 96/56 98/68  Pulse: (!) 108 (!) 101 96 77  Resp: 18 18 20 18   Temp: 99.5 F (37.5  C)  98.8 F (37.1 C) 98.6 F (37 C)  TempSrc: Oral  Oral Oral  SpO2: 100% 96% 100% 91%  Weight:   78.5 kg (173 lb 1.6 oz)   Height:        Wt Readings from Last 3 Encounters:  07/16/16 78.5 kg (173 lb 1.6 oz)  06/24/16 82.1 kg (181 lb)  01/12/16 92.7 kg (204 lb 7 oz)     Intake/Output Summary (Last 24 hours) at 07/16/16 0750 Last data filed at 07/15/16 0930  Gross per 24 hour  Intake              240 ml  Output                0 ml  Net              240 ml     Physical Exam  Awake Alert, Oriented X 3, No new F.N deficits, Normal affect [place].AT,PERRAL Supple Neck,No JVD, No cervical lymphadenopathy appriciated.  Symmetrical Chest wall movement, Good air movement bilaterally, CTAB RRR,No Gallops,Rubs or new Murmurs, No Parasternal Heave +ve B.Sounds, Abd Soft, No tenderness, No organomegaly appriciated, No rebound - guarding or rigidity. No Cyanosis, Clubbing or edema, No new Rash or bruise      Data Review:    CBC  Recent Labs Lab 07/09/16 1246 07/15/16 0449 07/16/16 0247  WBC 9.4 8.0 8.8  HGB 16.9* 13.0 12.5  HCT 49.5* 38.3  36.4  PLT 269 323 382  MCV 109.8* 104.1* 103.7*  MCH 37.5* 35.3* 35.6*  MCHC 34.1 33.9 34.3  RDW 17.8* 16.6* 16.5*  LYMPHSABS 1.0  --   --   MONOABS 1.0  --   --   EOSABS 0.0  --   --   BASOSABS 0.0  --   --     Chemistries   Recent Labs Lab 07/12/16 0317  07/12/16 1808 07/13/16 0227 07/14/16 0650 07/15/16 0449 07/16/16 0247  NA 137  --   --  138 134* 134* 134*  K 2.6*  < > 3.2* 2.9* 4.2 3.8 3.7  CL 104  --   --  103 99* 97* 96*  CO2 23  --   --  26 28 29 28   GLUCOSE 128*  --   --  110* 149* 157* 164*  BUN <5*  --   --  <5* <5* <5* <5*  CREATININE 0.75  --   --  0.47 0.51 0.58 0.60  CALCIUM 8.4*  --   --  8.8* 8.9 9.1 9.0  MG 2.0  --  1.8 1.6* 1.7 2.6* 2.0  AST 54*  --   --  61* 124* 165* 236*  ALT 49  --   --  51 73* 111* 156*  ALKPHOS 45  --   --  54 55 57 60  BILITOT 1.7*  --   --  1.3* 1.0 0.9 0.9  < > = values  in this interval not displayed. ------------------------------------------------------------------------------------------------------------------ No results for input(s): CHOL, HDL, LDLCALC, TRIG, CHOLHDL, LDLDIRECT in the last 72 hours.  Lab Results  Component Value Date   HGBA1C 6.1 (H) 07/10/2016   ------------------------------------------------------------------------------------------------------------------ No results for input(s): TSH, T4TOTAL, T3FREE, THYROIDAB in the last 72 hours.  Invalid input(s): FREET3 ------------------------------------------------------------------------------------------------------------------ No results for input(s): VITAMINB12, FOLATE, FERRITIN, TIBC, IRON, RETICCTPCT in the last 72 hours.  Coagulation profile  Recent Labs Lab 07/12/16 0317 07/13/16 0227 07/14/16 0650 07/15/16 0449 07/16/16 0247  INR 1.25 1.11 1.08 1.08 1.08    No results for input(s): DDIMER in the last 72 hours.  Cardiac Enzymes No results for input(s): CKMB, TROPONINI, MYOGLOBIN in the last 168 hours.  Invalid input(s): CK ------------------------------------------------------------------------------------------------------------------    Component Value Date/Time   BNP 84.7 07/12/2016 1808    Inpatient Medications  Scheduled Meds: . amLODipine  10 mg Oral Daily  . aspirin EC  81 mg Oral Daily  . enoxaparin (LOVENOX) injection  40 mg Subcutaneous Q24H  . feeding supplement (GLUCERNA SHAKE)  237 mL Oral TID BM  . folic acid  1 mg Oral Daily  . insulin aspart  0-5 Units Subcutaneous QHS  . insulin aspart  0-9 Units Subcutaneous TID WC  . lactulose  30 g Oral BID  . lisinopril  20 mg Oral Daily  . multivitamin with minerals  1 tablet Oral Daily  . nicotine  21 mg Transdermal Daily  . pantoprazole  40 mg Oral Q1200  . thiamine  100 mg Oral Daily   Continuous Infusions: PRN Meds:.acetaminophen, famotidine, hydrALAZINE, ondansetron (ZOFRAN) IV, oxyCODONE,  pneumococcal 23 valent vaccine, traMADol, zolpidem  Micro Results Recent Results (from the past 240 hour(s))  MRSA PCR Screening     Status: None   Collection Time: 07/10/16  6:59 AM  Result Value Ref Range Status   MRSA by PCR NEGATIVE NEGATIVE Final    Comment:        The GeneXpert MRSA Assay (FDA approved for NASAL specimens only),  is one component of a comprehensive MRSA colonization surveillance program. It is not intended to diagnose MRSA infection nor to guide or monitor treatment for MRSA infections.   Culture, blood (routine x 2)     Status: None   Collection Time: 07/10/16  8:12 PM  Result Value Ref Range Status   Specimen Description BLOOD RIGHT ANTECUBITAL  Final   Special Requests   Final    BOTTLES DRAWN AEROBIC AND ANAEROBIC Blood Culture adequate volume   Culture NO GROWTH 5 DAYS  Final   Report Status 07/15/2016 FINAL  Final  Culture, blood (routine x 2)     Status: None   Collection Time: 07/10/16  8:21 PM  Result Value Ref Range Status   Specimen Description BLOOD LEFT ANTECUBITAL  Final   Special Requests   Final    BOTTLES DRAWN AEROBIC AND ANAEROBIC Blood Culture results may not be optimal due to an excessive volume of blood received in culture bottles   Culture NO GROWTH 5 DAYS  Final   Report Status 07/15/2016 FINAL  Final  Culture, Urine     Status: Abnormal   Collection Time: 07/10/16  8:24 PM  Result Value Ref Range Status   Specimen Description URINE, RANDOM  Final   Special Requests NONE  Final   Culture MULTIPLE SPECIES PRESENT, SUGGEST RECOLLECTION (A)  Final   Report Status 07/12/2016 FINAL  Final    Radiology Reports Dg Chest 2 View  Result Date: 07/09/2016 CLINICAL DATA:  Admitted for acute pancreatitis with shortness of breath and oxygen desaturation. EXAM: CHEST  2 VIEW COMPARISON:  05/29/2015 chest radiograph. FINDINGS: Stable heart size and mediastinal contours are within normal limits. Both lungs are clear. The visualized skeletal  structures are unremarkable. IMPRESSION: No acute pulmonary process identified. Electronically Signed   By: Kristine Garbe M.D.   On: 07/09/2016 22:09   US Transvaginal Non-ob  Result Date: 07/09/2016 CLINICAL DATA:  41 year old female with left lower quadrant and suprapubic pain for 3-4 days. Negative serum pregnancy test today. Premenopausal. LMP 5 weeks ago. EXAM: TRANSABDOMINAL AND TRANSVAGINAL ULTRASOUND OF PELVIS DOPPLER ULTRASOUND OF OVARIES TECHNIQUE: Both transabdominal and transvaginal ultrasound examinations of the pelvis were performed. Transabdominal technique was performed for global imaging of the pelvis including uterus, ovaries, adnexal regions, and pelvic cul-de-sac. It was necessary to proceed with endovaginal exam following the transabdominal exam to visualize the ovaries. Color and duplex Doppler ultrasound was utilized to evaluate blood flow to the ovaries. COMPARISON:  Ob ultrasound 11/03/2009. FINDINGS: Uterus Measurements: 6.9 x 3.4 x 4.4 cm. Small intramural 9 mm fibroid at the posterior body of the uterus (image 59). Larger 2.5 cm pedunculated subserosal fibroid at the anterior fundus directed toward the left (image 63). There is some vascularity detected within this fibroid (images 65 and 79). Endometrium Thickness: 4 mm. No focal abnormality visualized. Incidental nabothian cysts at the cervix. Right ovary Only visible trans abdominally. Measurements: 2.5 x 2.8 x 2.2 cm. Simple appearing 19 mm cyst associated with the right ovary (image 26). Left ovary Only visible trans abdominally. Measurements: 3.0 x 2.0 x 1.6 cm. Normal appearance/no adnexal mass. Pulsed Doppler evaluation of both ovaries was difficult to the ovaries could not be visualized with transvaginal technique. Color Doppler flow is evident in both ovaries (right image 19 and left image 31) but spectral imaging could not be obtained. Other findings No abnormal free fluid. IMPRESSION: 1. No strong evidence of  ovarian torsion, but Doppler evaluation of both ovaries was difficult  and suboptimal. 2. Pedunculated 2.5 cm subserosal fibroid directed to the left from the anterior fundus. Smaller 9 mm intramural fibroid at the posterior body. 3. No pelvic free fluid. Electronically Signed   By: Genevie Ann M.D.   On: 07/09/2016 15:31   US Pelvis Complete  Result Date: 07/09/2016 CLINICAL DATA:  42 year old female with left lower quadrant and suprapubic pain for 3-4 days. Negative serum pregnancy test today. Premenopausal. LMP 5 weeks ago. EXAM: TRANSABDOMINAL AND TRANSVAGINAL ULTRASOUND OF PELVIS DOPPLER ULTRASOUND OF OVARIES TECHNIQUE: Both transabdominal and transvaginal ultrasound examinations of the pelvis were performed. Transabdominal technique was performed for global imaging of the pelvis including uterus, ovaries, adnexal regions, and pelvic cul-de-sac. It was necessary to proceed with endovaginal exam following the transabdominal exam to visualize the ovaries. Color and duplex Doppler ultrasound was utilized to evaluate blood flow to the ovaries. COMPARISON:  Ob ultrasound 11/03/2009. FINDINGS: Uterus Measurements: 6.9 x 3.4 x 4.4 cm. Small intramural 9 mm fibroid at the posterior body of the uterus (image 59). Larger 2.5 cm pedunculated subserosal fibroid at the anterior fundus directed toward the left (image 63). There is some vascularity detected within this fibroid (images 65 and 79). Endometrium Thickness: 4 mm. No focal abnormality visualized. Incidental nabothian cysts at the cervix. Right ovary Only visible trans abdominally. Measurements: 2.5 x 2.8 x 2.2 cm. Simple appearing 19 mm cyst associated with the right ovary (image 26). Left ovary Only visible trans abdominally. Measurements: 3.0 x 2.0 x 1.6 cm. Normal appearance/no adnexal mass. Pulsed Doppler evaluation of both ovaries was difficult to the ovaries could not be visualized with transvaginal technique. Color Doppler flow is evident in both ovaries  (right image 19 and left image 31) but spectral imaging could not be obtained. Other findings No abnormal free fluid. IMPRESSION: 1. No strong evidence of ovarian torsion, but Doppler evaluation of both ovaries was difficult and suboptimal. 2. Pedunculated 2.5 cm subserosal fibroid directed to the left from the anterior fundus. Smaller 9 mm intramural fibroid at the posterior body. 3. No pelvic free fluid. Electronically Signed   By: Genevie Ann M.D.   On: 07/09/2016 15:31   [place] Art/ven Flow Abd Pelv Doppler  Result Date: 07/09/2016 CLINICAL DATA:  41 year old female with left lower quadrant and suprapubic pain for 3-4 days. Negative serum pregnancy test today. Premenopausal. LMP 5 weeks ago. EXAM: TRANSABDOMINAL AND TRANSVAGINAL ULTRASOUND OF PELVIS DOPPLER ULTRASOUND OF OVARIES TECHNIQUE: Both transabdominal and transvaginal ultrasound examinations of the pelvis were performed. Transabdominal technique was performed for global imaging of the pelvis including uterus, ovaries, adnexal regions, and pelvic cul-de-sac. It was necessary to proceed with endovaginal exam following the transabdominal exam to visualize the ovaries. Color and duplex Doppler ultrasound was utilized to evaluate blood flow to the ovaries. COMPARISON:  Ob ultrasound 11/03/2009. FINDINGS: Uterus Measurements: 6.9 x 3.4 x 4.4 cm. Small intramural 9 mm fibroid at the posterior body of the uterus (image 59). Larger 2.5 cm pedunculated subserosal fibroid at the anterior fundus directed toward the left (image 63). There is some vascularity detected within this fibroid (images 65 and 79). Endometrium Thickness: 4 mm. No focal abnormality visualized. Incidental nabothian cysts at the cervix. Right ovary Only visible trans abdominally. Measurements: 2.5 x 2.8 x 2.2 cm. Simple appearing 19 mm cyst associated with the right ovary (image 26). Left ovary Only visible trans abdominally. Measurements: 3.0 x 2.0 x 1.6 cm. Normal appearance/no adnexal mass.  Pulsed Doppler evaluation of both ovaries was difficult to the ovaries  could not be visualized with transvaginal technique. Color Doppler flow is evident in both ovaries (right image 19 and left image 31) but spectral imaging could not be obtained. Other findings No abnormal free fluid. IMPRESSION: 1. No strong evidence of ovarian torsion, but Doppler evaluation of both ovaries was difficult and suboptimal. 2. Pedunculated 2.5 cm subserosal fibroid directed to the left from the anterior fundus. Smaller 9 mm intramural fibroid at the posterior body. 3. No pelvic free fluid. Electronically Signed   By: Genevie Ann M.D.   On: 07/09/2016 15:31   Dg Chest Port 1 View  Result Date: 07/10/2016 CLINICAL DATA:  Acute respiratory failure. EXAM: PORTABLE CHEST 1 VIEW COMPARISON:  07/09/2016 FINDINGS: Cardiomediastinal silhouette is normal. Mediastinal contours appear intact. There is no evidence of focal airspace consolidation, pleural effusion or pneumothorax. Mild volume loss with streaky airspace opacity in the left lung base. Osseous structures are without acute abnormality. Soft tissues are grossly normal. IMPRESSION: Mild volume loss in the left lung base, otherwise unremarkable. Electronically Signed   By: Fidela Salisbury M.D.   On: 07/10/2016 19:16   Mr Abdomen Mrcp Moise Boring Contast  Result Date: 07/11/2016 CLINICAL DATA:  Liver mass on CT, evaluate for hepatocellular carcinoma EXAM: MRI ABDOMEN WITHOUT AND WITH CONTRAST (INCLUDING MRCP) TECHNIQUE: Multiplanar multisequence MR imaging of the abdomen was performed both before and after the administration of intravenous contrast. Heavily T2-weighted images of the biliary and pancreatic ducts were obtained, and three-dimensional MRCP images were rendered by post processing. CONTRAST:  60mL MULTIHANCE GADOBENATE DIMEGLUMINE 529 MG/ML IV SOLN COMPARISON:  CT abdomen/ pelvis dated 07/09/2016 FINDINGS: Motion degraded images. Lower chest: Lung bases are essentially clear.  Hepatobiliary: Severe hepatic steatosis. Apparent heterogeneous perfusion of the posterior right hepatic lobe is favored to reflect phase encoding artifact (for example, series 2302/image 30). No suspicious/enhancing hepatic lesions. The focal hypoenhancing lesion on CT is not evident on MRI, although this may be related to technical limitations. Mildly wavy hepatic contour, without definite findings to suggest cirrhosis. Gallbladder is unremarkable.  No evidence of cholelithiasis. No intrahepatic or extrahepatic ductal dilatation. Common duct measures 3 mm. No choledocholithiasis is seen. Pancreas: Peripancreatic inflammatory changes/ fluid, reflecting acute pancreatitis. No underlying pancreatic mass. No pancreatic ductal dilatation. No evidence of pseudocyst. Spleen:  Within normal limits. Adrenals/Urinary Tract: 1.8 cm left adrenal nodule (series 18/ image 16) with suspected signal loss on opposed phase imaging, compatible with a benign adrenal adenoma. Right adrenal glands within normal limits. Kidneys are within normal limits.  No hydronephrosis. Stomach/Bowel: Stomach and visualized bowel are unremarkable. Vascular/Lymphatic:  No evidence of abdominal aortic aneurysm. No suspicious abdominal lymphadenopathy. Other: Small volume upper abdominal ascites/mesenteric fluid, related to acute pancreatitis. Musculoskeletal: No focal osseous lesions. IMPRESSION: Focal hepatic lesion on CT is not evident on MRI, although this may be related to technical limitations. Regardless, this is not considered concerning for hepatocellular carcinoma given the lack of cirrhosis, in the absence of known hepatitis B. Consider follow-up liver protocol CT abdomen with/without contrast in 3-6 months for further characterization. Underlying severe hepatic steatosis. 1.8 cm left adrenal nodule, favoring a benign adrenal adenoma. Acute uncomplicated pancreatitis. No cholelithiasis or choledocholithiasis is seen. Common duct measures 3  mm. Electronically Signed   By: Julian Hy M.D.   On: 07/11/2016 07:12   Ct Renal Stone Study  Result Date: 07/09/2016 CLINICAL DATA:  Nausea, vomiting, and diarrhea for 3 days, elevated lipase, acute kidney injury, elevated bilirubin, history hypertension EXAM: CT ABDOMEN AND PELVIS  WITHOUT CONTRAST TECHNIQUE: Multidetector CT imaging of the abdomen and pelvis was performed following the standard protocol without IV contrast. Sagittal and coronal MPR images reconstructed from axial data set. Oral contrast was not administered. COMPARISON:  None FINDINGS: Lower chest: Lung bases clear Hepatobiliary: Diffuse fatty infiltration of liver. Nonspecific low-attenuation focus posterior RIGHT lobe liver image 26, 13 x 10 mm. Intermediate attenuation material within gallbladder which could represent sludge or numerous tiny calculi. No biliary dilatation or additional hepatic abnormalities Pancreas: Pancreas is enlarged with poorly defined peripancreatic planes compatible with acute pancreatitis. No discrete pancreatic mass or fluid collection. Spleen: Normal appearance Adrenals/Urinary Tract: Thickening of both adrenal glands with question mass at LEFT adrenal gland 19 x 14 mm image 20. Kidneys, ureters, and bladder normal appearance. Stomach/Bowel: Normal appendix. Stomach and bowel loops normal appearance Vascular/Lymphatic: Aorta normal caliber with atherosclerotic calcification at bifurcation. No adenopathy. Few scattered pelvic phleboliths. Reproductive: Exophytic leiomyoma at LEFT uterus, 2.7 x 1.9 x 1.7 cm. Uterus and adnexa otherwise unremarkable. Other: Umbilical hernia containing fat.  No free air or free fluid. Musculoskeletal: No acute osseous findings. IMPRESSION: Acute pancreatitis without peripancreatic fluid collection or pancreatic mass. Fatty infiltration of liver with a nonspecific 13 x 10 mm lesion in the RIGHT lobe. Intermediate attenuation material within gallbladder question sludge or  stones, could potentially represent vicarious excretion of contrast material if patient has had recent contrast administration. 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 Small uterine leiomyoma. Umbilical hernia containing fat. Electronically Signed   By: Lavonia Dana M.D.   On: 07/09/2016 16:31   US Abdomen Limited Ruq  Result Date: 07/09/2016 CLINICAL DATA:  Pancreatitis EXAM: ULTRASOUND ABDOMEN LIMITED RIGHT UPPER QUADRANT COMPARISON:  07/09/2016 FINDINGS: Gallbladder: No gallstones or wall thickening visualized. No sonographic Murphy sign noted by sonographer. Common bile duct: Diameter: 2 mm Liver: Increased echogenicity, heterogenous consistent with fatty infiltration. Unable to visualize the right posterior hepatic lobe CT lesion. Difficult visualization of flow in the portal vessels likely due to fatty infiltration. IMPRESSION: 1. Negative for gallstones or biliary dilatation 2. Coarse heterogeneous echogenic liver consistent with fatty infiltration. Difficult visualization of portal vessels, probably due to fatty infiltration ; follow-up contrast-enhanced CT could be obtained to confirm patency of the portal vein. 3. Previously described right hepatic lobe lesion is not well seen by ultrasound. Electronically Signed   By: Donavan Foil M.D.   On: 07/09/2016 22:58    Time Spent in minutes  30   Jani Gravel M.D on 07/16/2016 at 7:50 AM  Between 7am to 7pm - Pager - [phone]  After 7pm go to [email or web address] - password [email or web address]  [facility] -  Office  [phone]

## 2016-07-17 DIAGNOSIS — Z789 Other specified health status: Secondary | ICD-10-CM

## 2016-07-17 DIAGNOSIS — N179 Acute kidney failure, unspecified: Secondary | ICD-10-CM

## 2016-07-17 LAB — COMPREHENSIVE METABOLIC PANEL
ALBUMIN: 3.3 g/dL — AB (ref 3.5–5.0)
ALK PHOS: 61 U/L (ref 38–126)
ALT: 165 U/L — ABNORMAL HIGH (ref 14–54)
ANION GAP: 10 (ref 5–15)
AST: 198 U/L — ABNORMAL HIGH (ref 15–41)
BILIRUBIN TOTAL: 0.7 mg/dL (ref 0.3–1.2)
BUN: <5 mg/dL — ABNORMAL LOW (ref 6–20)
CALCIUM: 9.1 mg/dL (ref 8.9–10.3)
CO2: 26 mmol/L (ref 22–32)
Chloride: 95 mmol/L — ABNORMAL LOW (ref 101–111)
Creatinine, Ser: 0.62 mg/dL (ref 0.44–1.00)
GFR calc non Af Amer: >60 mL/min (ref >60)
GLUCOSE: 166 mg/dL — AB (ref 65–99)
POTASSIUM: 3.7 mmol/L (ref 3.5–5.1)
Sodium: 131 mmol/L — ABNORMAL LOW (ref 135–145)
TOTAL PROTEIN: 6.9 g/dL (ref 6.5–8.1)

## 2016-07-17 LAB — CBC
HEMATOCRIT: 36.3 % (ref 36.0–46.0)
HEMOGLOBIN: 12.3 g/dL (ref 12.0–15.0)
MCH: 35.2 pg — AB (ref 26.0–34.0)
MCHC: 33.9 g/dL (ref 30.0–36.0)
MCV: 104 fL — AB (ref 78.0–100.0)
Platelets: 494 10*3/uL — ABNORMAL HIGH (ref 150–400)
RBC: 3.49 MIL/uL — ABNORMAL LOW (ref 3.87–5.11)
RDW: 16.5 % — AB (ref 11.5–15.5)
WBC: 8.4 10*3/uL (ref 4.0–10.5)

## 2016-07-17 LAB — GLUCOSE, CAPILLARY: Glucose-Capillary: 149 mg/dL — ABNORMAL HIGH (ref 65–99)

## 2016-07-17 LAB — AMMONIA: AMMONIA: 27 umol/L (ref 9–35)

## 2016-07-17 LAB — ETHANOL

## 2016-07-17 LAB — MAGNESIUM: Magnesium: 1.9 mg/dL (ref 1.7–2.4)

## 2016-07-17 LAB — PROTIME-INR
INR: 1.05
PROTHROMBIN TIME: 13.7 s (ref 11.4–15.2)

## 2016-07-17 LAB — LIPASE, BLOOD: LIPASE: 162 U/L — AB (ref 11–51)

## 2016-07-17 MED ORDER — PANTOPRAZOLE SODIUM 40 MG PO TBEC: 40.0000 mg | DELAYED_RELEASE_TABLET | Freq: Every day | ORAL | 0 refills | Status: DC

## 2016-07-17 MED ORDER — THIAMINE HCL 100 MG PO TABS: 100.0000 mg | ORAL_TABLET | Freq: Every day | ORAL | 0 refills | Status: DC

## 2016-07-17 MED ORDER — NICOTINE 21 MG/24HR TD PT24: 21.0000 mg | MEDICATED_PATCH | Freq: Every day | TRANSDERMAL | 0 refills | Status: DC

## 2016-07-17 MED ORDER — FOLIC ACID 1 MG PO TABS: 1.0000 mg | ORAL_TABLET | Freq: Every day | ORAL | 0 refills | Status: DC

## 2016-07-17 MED ORDER — LACTULOSE 10 GM/15ML PO SOLN
30.0000 g | Freq: Two times a day (BID) | ORAL | 0 refills | Status: DC
Start: 2016-07-17 — End: 2016-07-26

## 2016-07-17 MED ORDER — TRAMADOL HCL 50 MG PO TABS: 50.0000 mg | ORAL_TABLET | Freq: Four times a day (QID) | ORAL | 0 refills | Status: DC | PRN

## 2016-07-17 NOTE — Care Management Note (Signed)
Case Management Note  Patient Details  Name: Terri Morris MRN: [id number] Date of Birth: [birth year]  Subjective/Objective:   Pt admitted with abd pain, n/v             Action/Plan:  PTA from home with husband   Expected Discharge Date:  07/17/16               Expected Discharge Plan:  Home/Self Care  In-House Referral:     Discharge planning Services  CM Consult  Post Acute Care Choice:    Choice offered to:     DME Arranged:    DME Agency:     HH Arranged:    [place] Agency:     Status of Service:     If discussed at [employer], dates discussed:    Additional Comments: 07/17/2016 Pt to discharge home today.  Pt alert and oriented - denied all barriers to returning home.  Pt is completely independent, has PCP and denied barriers to discharging home.  No CM needs identified prior to discharge Maryclare Labrador, RN 07/17/2016, 8:57 AM

## 2016-07-17 NOTE — Discharge Summary (Signed)
Terri Morris, is a 41 y.o. female  DOB May 19, 1975  MRN [id number].  Admission date:  07/09/2016  Admitting Physician  Ivor Costa, MD  Discharge Date:  07/17/2016   Primary MD  Saguier, Percell Miller, PA-C  Recommendations for primary care physician for things to follow:     Abnormal liver function likely due to ETOH use/Fatty liver Probably due to fatty liver Pt can have outpatient f/u with GI Recommended that she stop etoh use.  Check CMP in 1-2 weeks If LFT persistently abnormal then check  ferritin, iron/tibc, ceruloplasmin, alpha-1 antitrypsin Ana, anti smooth muscle ab, Celiac panel, AMA  Acute alcohol associated pancreatitis Improved Please f/u with GI  Alcohol abuse No evidence of acute withdrawal - pt has been counseled on absolute need to stop EtOH entirely  Nonspecific liver lesion Nonspecific 13 x 10 mm lesion in the right lobe noted on CT scan abdomen - no liver lesion noted on MRCP - follow-up liver protocol CT abdomen with contrast suggested in 3-6 months  Gerd Cont protonix  Toxic metabolic encephalopathy Multifactorial to include acute liver failure, acute pancreatitis, metabolic acidosis, EtOH withdrawal - resolved - cont lactulose , may be able to taper off as outpatient  Glucose intolerance,  During hospitalization, metabolic acidosis seconadry to mild DKA?  Please check Hga1c in 31months.   Acute renal failure resolved Check cmp in 1-2 weeks.   Acute hypoxic respiratory failure Likely due to obtundation plus acute pancreatitis - Note she did have opioids on her UDS on admission  CHF (diastolic) likely due to aggressive hydration No CXR to show CHF  Please check daily weight, f/u with cardiology   Severe hypokalemia resolved Check cmp in 1-2 weeks  Hypomagnesemia resolved Check magnesium in 1-2 weeks  Hyponatremia Check cmp in 1-2 weeks  Tobacco  abuse Nicotine patch, pt counselled on smoking cessation x 42minutes.   HTN Resume lisinopril, resume amlodipine at home dose      Admission Diagnosis  Hyperkalemia [T55.7] Metabolic acidosis [D22.0] AKI (acute kidney injury) ([place]) [N17.9] Diabetic ketoacidosis without coma associated with other specified diabetes mellitus ([place]) [E13.10] Acute pancreatitis, unspecified complication status, unspecified pancreatitis type [K85.90]   Discharge Diagnosis  Hyperkalemia [U54.2] Metabolic acidosis [H06.2] AKI (acute kidney injury) ([place]) [N17.9] Diabetic ketoacidosis without coma associated with other specified diabetes mellitus ([place]) [E13.10] Acute pancreatitis, unspecified complication status, unspecified pancreatitis type [K85.90]    Principal Problem:   Acute pancreatitis Active Problems:   Hypertension   Smoking   DKA (diabetic ketoacidoses) ([place])   AKI (acute kidney injury) ([place])   Hyperkalemia   Alcohol use   Metabolic acidosis   Acute respiratory failure with hypoxia ([facility])   Liver lesion   HLD (hyperlipidemia)   Acute diastolic CHF (congestive heart failure) ([place])   Abnormal liver function      Past Medical History:  Diagnosis Date  . AKI (acute kidney injury) ([place]) 07/09/2016  . Anxiety   . HLD (hyperlipidemia) 07/09/2016  . Hypertension   . Thyroid disease   .  Vaginal Pap smear, abnormal     Past Surgical History:  Procedure Laterality Date  . ECTOPIC PREGNANCY SURGERY    . LEEP         HPI  from the history and physical done on the day of admission:   41 y.o. female with medical history significant of hypertension, hyperlipidemia, anxiety, tobacco abuse, alcohol use, who presents with nausea, vomiting, abdominal pain.  Patient states that her abdominal pain started 3 days ago. It is located in the central abdomen, constant, 10 out of 10 in severity, sharp, radiating to the back. It is not aggravated or admitted any factors. It is associated with nausea  and vomiting. She vomited 2-4 times each day. No diarrhea. No hematochezia or hematemesis. Denies symptoms of UTI. She has mild shortness of breath on exertion, no cough or chest pain. Denies fever or chills. . No unilateral weakness.Patient states that she drinks beer, approximately 3 beers each day.  ED Course: pt was found to have Lipase 1875, abnormal liver functions with AST 168, ALT 136, total bilirubin 2.4 and ALP 84, potassium of 5.4, TG 370, bicarbonate less than 7, acute renal injury with creatinine 2.96, blood sugar 249, ABG with pH of 7.003, PCO2 24, PO2 30. WBC 9.4, lactate acid 1.89, UDS positive for opiates, negative pregnancy test, Tylenol level less than 10, salicylate level less than 7, negative urinalysis for UTI, but positive for ketone, temperature normal, tachycardia, oxygen saturation 85 earlier, which is 100% on room air now. Patient is admitted to stepdown bed as inpatient.   CT-renal stone study showed: 1. Acute pancreatitis without peripancreatic fluid collection or pancreatic mass. 2. Fatty infiltration of liver with a nonspecific 13 x 10 mm lesion in the RIGHT lobe. 3. Intermediate attenuation material within gallbladder question sludge or stones, could potentially represent vicarious excretion of contrast material if patient has had recent contrast administration. 4. 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 5. Small uterine leiomyoma. 6. Umbilical hernia containing fat.  US-pelvise showed  1. No strong evidence of ovarian torsion, but Doppler evaluation of both ovaries was difficult and suboptimal. 2. Pedunculated 2.5 cm subserosal fibroid directed to the left from the anterior fundus. Smaller 9 mm intramural fibroid at the posterior body. 3. No pelvic free fluid      Hospital Course:     Pt was admitted and appeared obtunded due to her alcohol use as well as possibly mild hepatic encephalopathy.  Pt was started on laculose via NGT.  Pt was tx with CIWA.  Her pancreatitis  was thought to be secondary to ETOH use/acalculous cholcystitis.  Pt had metabolic acidosis ? Due to DKA (mild)  which resolved after 1 day.  She was converted from glucose stabilizer to SSI.   Due to AKI her lisinopril was held.  There was a nonspecific liver lesion on her CT and an MRI/MRCP  Abdomen 7/2=> Focal hepatic lesion on CT is not evident on MRI, although this may be related to technical limitations. Regardless, this is not considered concerning for hepatocellular carcinoma given the lack of cirrhosis, in the absence of known hepatitis B. Consider follow-up liver protocol CT abdomen with/without contrast in 3-6 months for further characterization. Underlying severe hepatic steatosis.  Acute uncomplicated pancreatitis. No cholelithiasis or choledocholithiasis is seen. Common duct measures 3 mm.  Pt had apparently acute hypoxic respiratory failure on 7/1 CXR 6/30 => negative ,  and Cardiac echo 7/2=> 10-62%, grade 2 diastolic dysfunction. Pt was thought to have acute diastolic CHF on 7/3 and  cardiology consulted. This was thought to be secondary to aggressive IV hydration.  Pt was treated with Lasix IV.  With her diuresis she had hypokalemia and hypomagnesemia which were repleted.    Her liver function trended up.  ? Surreptitious etoh use.  She was told to not to drink and her liver function is improving this am.    Pt abdominal pain improved over the past 3 days.  She is currently pain free and stable and requesting discharge.        Follow UP  Follow-up Information    [facility] Follow up on 07/26/2016.   Specialty:  Cardiology Why:  at 1130 am for post hospital follow up. Please bring all of your medications to your visit. The code for parking is [phone]. Leisure centre manager through [employer] off of [address]. Underground parking on your right. Can also park in lower ED lot and enter blue awning Contact information: [address] [phone] [place]       [place], PA-C Follow up in 1 week(s).   Specialties:  Internal Medicine, Family Medicine Contact information: [address] [place] [address] [phone]        Gatha Mayer, MD Follow up in 2 week(s).   Specialty:  Gastroenterology Contact information: [address] [place] [address] [phone]            Consults obtained - Cardiology  Discharge Condition: stable  Diet and Activity recommendation: See Discharge Instructions below  Discharge Instructions         Discharge Medications     Allergies as of 07/17/2016      Reactions   Ferrous Sulfate Other (See Comments)   Severe stomach pains and constipation      Medication List    STOP taking these medications   Fenofibric Acid 45 MG Cpdr   ibuprofen 200 MG tablet Commonly known as:  ADVIL,MOTRIN     TAKE these medications   amLODipine 5 MG tablet Commonly known as:  NORVASC Take 1 tablet (5 mg total) by mouth daily. What changed:  when to take this   aspirin EC 81 MG tablet Take 162 mg by mouth daily.   ferrous sulfate 325 (65 FE) MG EC tablet Take 1 tablet (325 mg total) by mouth 3 (three) times daily with meals.   folic acid 1 MG tablet Commonly known as:  FOLVITE Take 1 tablet (1 mg total) by mouth daily.   lactulose 10 GM/15ML solution Commonly known as:  CHRONULAC Take 45 mLs (30 g total) by mouth 2 (two) times daily.   lisinopril 20 MG tablet Commonly known as:  PRINIVIL,ZESTRIL TAKE 1 TABLET(20 MG) BY MOUTH DAILY What changed:  how much to take  how to take this  when to take this  additional instructions   nicotine 21 mg/24hr patch Commonly known as:  NICODERM CQ - dosed in mg/24 hours Place 1 patch (21 mg total) onto the skin daily.   pantoprazole 40 MG tablet Commonly known as:  PROTONIX Take 1 tablet (40 mg total) by mouth daily at 12 noon.    thiamine 100 MG tablet Take 1 tablet (100 mg total) by mouth daily.   traMADol 50 MG tablet Commonly known as:  ULTRAM Take 1 tablet (50 mg total) by mouth every 6 (six) hours as needed for moderate pain.       Major procedures and Radiology Reports -  PLEASE review detailed and final reports for all details, in brief -      Dg Chest 2 View  Result Date: 07/09/2016 CLINICAL DATA:  Admitted for acute pancreatitis with shortness of breath and oxygen desaturation. EXAM: CHEST  2 VIEW COMPARISON:  05/29/2015 chest radiograph. FINDINGS: Stable heart size and mediastinal contours are within normal limits. Both lungs are clear. The visualized skeletal structures are unremarkable. IMPRESSION: No acute pulmonary process identified. Electronically Signed   By: Kristine Garbe M.D.   On: 07/09/2016 22:09   US Transvaginal Non-ob  Result Date: 07/09/2016 CLINICAL DATA:  40 year old female with left lower quadrant and suprapubic pain for 3-4 days. Negative serum pregnancy test today. Premenopausal. LMP 5 weeks ago. EXAM: TRANSABDOMINAL AND TRANSVAGINAL ULTRASOUND OF PELVIS DOPPLER ULTRASOUND OF OVARIES TECHNIQUE: Both transabdominal and transvaginal ultrasound examinations of the pelvis were performed. Transabdominal technique was performed for global imaging of the pelvis including uterus, ovaries, adnexal regions, and pelvic cul-de-sac. It was necessary to proceed with endovaginal exam following the transabdominal exam to visualize the ovaries. Color and duplex Doppler ultrasound was utilized to evaluate blood flow to the ovaries. COMPARISON:  Ob ultrasound 11/03/2009. FINDINGS: Uterus Measurements: 6.9 x 3.4 x 4.4 cm. Small intramural 9 mm fibroid at the posterior body of the uterus (image 59). Larger 2.5 cm pedunculated subserosal fibroid at the anterior fundus directed toward the left (image 63). There is some vascularity detected within this fibroid (images 65 and 79). Endometrium Thickness:  4 mm. No focal abnormality visualized. Incidental nabothian cysts at the cervix. Right ovary Only visible trans abdominally. Measurements: 2.5 x 2.8 x 2.2 cm. Simple appearing 19 mm cyst associated with the right ovary (image 26). Left ovary Only visible trans abdominally. Measurements: 3.0 x 2.0 x 1.6 cm. Normal appearance/no adnexal mass. Pulsed Doppler evaluation of both ovaries was difficult to the ovaries could not be visualized with transvaginal technique. Color Doppler flow is evident in both ovaries (right image 19 and left image 31) but spectral imaging could not be obtained. Other findings No abnormal free fluid. IMPRESSION: 1. No strong evidence of ovarian torsion, but Doppler evaluation of both ovaries was difficult and suboptimal. 2. Pedunculated 2.5 cm subserosal fibroid directed to the left from the anterior fundus. Smaller 9 mm intramural fibroid at the posterior body. 3. No pelvic free fluid. Electronically Signed   By: Genevie Ann M.D.   On: 07/09/2016 15:31   US Pelvis Complete  Result Date: 07/09/2016 CLINICAL DATA:  41 year old female with left lower quadrant and suprapubic pain for 3-4 days. Negative serum pregnancy test today. Premenopausal. LMP 5 weeks ago. EXAM: TRANSABDOMINAL AND TRANSVAGINAL ULTRASOUND OF PELVIS DOPPLER ULTRASOUND OF OVARIES TECHNIQUE: Both transabdominal and transvaginal ultrasound examinations of the pelvis were performed. Transabdominal technique was performed for global imaging of the pelvis including uterus, ovaries, adnexal regions, and pelvic cul-de-sac. It was necessary to proceed with endovaginal exam following the transabdominal exam to visualize the ovaries. Color and duplex Doppler ultrasound was utilized to evaluate blood flow to the ovaries. COMPARISON:  Ob ultrasound 11/03/2009. FINDINGS: Uterus Measurements: 6.9 x 3.4 x 4.4 cm. Small intramural 9 mm fibroid at the posterior body of the uterus (image 59). Larger 2.5 cm pedunculated subserosal fibroid at the  anterior fundus directed toward the left (image 63). There is some vascularity detected within this fibroid (images 65 and 79). Endometrium Thickness: 4 mm. No focal abnormality visualized. Incidental nabothian cysts at the cervix. Right ovary Only visible trans abdominally. Measurements: 2.5 x 2.8  x 2.2 cm. Simple appearing 19 mm cyst associated with the right ovary (image 26). Left ovary Only visible trans abdominally. Measurements: 3.0 x 2.0 x 1.6 cm. Normal appearance/no adnexal mass. Pulsed Doppler evaluation of both ovaries was difficult to the ovaries could not be visualized with transvaginal technique. Color Doppler flow is evident in both ovaries (right image 19 and left image 31) but spectral imaging could not be obtained. Other findings No abnormal free fluid. IMPRESSION: 1. No strong evidence of ovarian torsion, but Doppler evaluation of both ovaries was difficult and suboptimal. 2. Pedunculated 2.5 cm subserosal fibroid directed to the left from the anterior fundus. Smaller 9 mm intramural fibroid at the posterior body. 3. No pelvic free fluid. Electronically Signed   By: Genevie Ann M.D.   On: 07/09/2016 15:31   [place] Art/ven Flow Abd Pelv Doppler  Result Date: 07/09/2016 CLINICAL DATA:  41 year old female with left lower quadrant and suprapubic pain for 3-4 days. Negative serum pregnancy test today. Premenopausal. LMP 5 weeks ago. EXAM: TRANSABDOMINAL AND TRANSVAGINAL ULTRASOUND OF PELVIS DOPPLER ULTRASOUND OF OVARIES TECHNIQUE: Both transabdominal and transvaginal ultrasound examinations of the pelvis were performed. Transabdominal technique was performed for global imaging of the pelvis including uterus, ovaries, adnexal regions, and pelvic cul-de-sac. It was necessary to proceed with endovaginal exam following the transabdominal exam to visualize the ovaries. Color and duplex Doppler ultrasound was utilized to evaluate blood flow to the ovaries. COMPARISON:  Ob ultrasound 11/03/2009. FINDINGS: Uterus  Measurements: 6.9 x 3.4 x 4.4 cm. Small intramural 9 mm fibroid at the posterior body of the uterus (image 59). Larger 2.5 cm pedunculated subserosal fibroid at the anterior fundus directed toward the left (image 63). There is some vascularity detected within this fibroid (images 65 and 79). Endometrium Thickness: 4 mm. No focal abnormality visualized. Incidental nabothian cysts at the cervix. Right ovary Only visible trans abdominally. Measurements: 2.5 x 2.8 x 2.2 cm. Simple appearing 19 mm cyst associated with the right ovary (image 26). Left ovary Only visible trans abdominally. Measurements: 3.0 x 2.0 x 1.6 cm. Normal appearance/no adnexal mass. Pulsed Doppler evaluation of both ovaries was difficult to the ovaries could not be visualized with transvaginal technique. Color Doppler flow is evident in both ovaries (right image 19 and left image 31) but spectral imaging could not be obtained. Other findings No abnormal free fluid. IMPRESSION: 1. No strong evidence of ovarian torsion, but Doppler evaluation of both ovaries was difficult and suboptimal. 2. Pedunculated 2.5 cm subserosal fibroid directed to the left from the anterior fundus. Smaller 9 mm intramural fibroid at the posterior body. 3. No pelvic free fluid. Electronically Signed   By: Genevie Ann M.D.   On: 07/09/2016 15:31   Dg Chest Port 1 View  Result Date: 07/10/2016 CLINICAL DATA:  Acute respiratory failure. EXAM: PORTABLE CHEST 1 VIEW COMPARISON:  07/09/2016 FINDINGS: Cardiomediastinal silhouette is normal. Mediastinal contours appear intact. There is no evidence of focal airspace consolidation, pleural effusion or pneumothorax. Mild volume loss with streaky airspace opacity in the left lung base. Osseous structures are without acute abnormality. Soft tissues are grossly normal. IMPRESSION: Mild volume loss in the left lung base, otherwise unremarkable. Electronically Signed   By: Fidela Salisbury M.D.   On: 07/10/2016 19:16   Mr Abdomen Mrcp Moise Boring Contast  Result Date: 07/11/2016 CLINICAL DATA:  Liver mass on CT, evaluate for hepatocellular carcinoma EXAM: MRI ABDOMEN WITHOUT AND WITH CONTRAST (INCLUDING MRCP) TECHNIQUE: Multiplanar multisequence MR imaging of the abdomen  was performed both before and after the administration of intravenous contrast. Heavily T2-weighted images of the biliary and pancreatic ducts were obtained, and three-dimensional MRCP images were rendered by post processing. CONTRAST:  80mL MULTIHANCE GADOBENATE DIMEGLUMINE 529 MG/ML IV SOLN COMPARISON:  CT abdomen/ pelvis dated 07/09/2016 FINDINGS: Motion degraded images. Lower chest: Lung bases are essentially clear. Hepatobiliary: Severe hepatic steatosis. Apparent heterogeneous perfusion of the posterior right hepatic lobe is favored to reflect phase encoding artifact (for example, series 2302/image 30). No suspicious/enhancing hepatic lesions. The focal hypoenhancing lesion on CT is not evident on MRI, although this may be related to technical limitations. Mildly wavy hepatic contour, without definite findings to suggest cirrhosis. Gallbladder is unremarkable.  No evidence of cholelithiasis. No intrahepatic or extrahepatic ductal dilatation. Common duct measures 3 mm. No choledocholithiasis is seen. Pancreas: Peripancreatic inflammatory changes/ fluid, reflecting acute pancreatitis. No underlying pancreatic mass. No pancreatic ductal dilatation. No evidence of pseudocyst. Spleen:  Within normal limits. Adrenals/Urinary Tract: 1.8 cm left adrenal nodule (series 18/ image 16) with suspected signal loss on opposed phase imaging, compatible with a benign adrenal adenoma. Right adrenal glands within normal limits. Kidneys are within normal limits.  No hydronephrosis. Stomach/Bowel: Stomach and visualized bowel are unremarkable. Vascular/Lymphatic:  No evidence of abdominal aortic aneurysm. No suspicious abdominal lymphadenopathy. Other: Small volume upper abdominal ascites/mesenteric  fluid, related to acute pancreatitis. Musculoskeletal: No focal osseous lesions. IMPRESSION: Focal hepatic lesion on CT is not evident on MRI, although this may be related to technical limitations. Regardless, this is not considered concerning for hepatocellular carcinoma given the lack of cirrhosis, in the absence of known hepatitis B. Consider follow-up liver protocol CT abdomen with/without contrast in 3-6 months for further characterization. Underlying severe hepatic steatosis. 1.8 cm left adrenal nodule, favoring a benign adrenal adenoma. Acute uncomplicated pancreatitis. No cholelithiasis or choledocholithiasis is seen. Common duct measures 3 mm. Electronically Signed   By: Julian Hy M.D.   On: 07/11/2016 07:12   Ct Renal Stone Study  Result Date: 07/09/2016 CLINICAL DATA:  Nausea, vomiting, and diarrhea for 3 days, elevated lipase, acute kidney injury, elevated bilirubin, history hypertension EXAM: CT ABDOMEN AND PELVIS WITHOUT CONTRAST TECHNIQUE: Multidetector CT imaging of the abdomen and pelvis was performed following the standard protocol without IV contrast. Sagittal and coronal MPR images reconstructed from axial data set. Oral contrast was not administered. COMPARISON:  None FINDINGS: Lower chest: Lung bases clear Hepatobiliary: Diffuse fatty infiltration of liver. Nonspecific low-attenuation focus posterior RIGHT lobe liver image 26, 13 x 10 mm. Intermediate attenuation material within gallbladder which could represent sludge or numerous tiny calculi. No biliary dilatation or additional hepatic abnormalities Pancreas: Pancreas is enlarged with poorly defined peripancreatic planes compatible with acute pancreatitis. No discrete pancreatic mass or fluid collection. Spleen: Normal appearance Adrenals/Urinary Tract: Thickening of both adrenal glands with question mass at LEFT adrenal gland 19 x 14 mm image 20. Kidneys, ureters, and bladder normal appearance. Stomach/Bowel: Normal appendix.  Stomach and bowel loops normal appearance Vascular/Lymphatic: Aorta normal caliber with atherosclerotic calcification at bifurcation. No adenopathy. Few scattered pelvic phleboliths. Reproductive: Exophytic leiomyoma at LEFT uterus, 2.7 x 1.9 x 1.7 cm. Uterus and adnexa otherwise unremarkable. Other: Umbilical hernia containing fat.  No free air or free fluid. Musculoskeletal: No acute osseous findings. IMPRESSION: Acute pancreatitis without peripancreatic fluid collection or pancreatic mass. Fatty infiltration of liver with a nonspecific 13 x 10 mm lesion in the RIGHT lobe. Intermediate attenuation material within gallbladder question sludge or stones, could potentially represent vicarious  excretion of contrast material if patient has had recent contrast administration. 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 Small uterine leiomyoma. Umbilical hernia containing fat. Electronically Signed   By: Lavonia Dana M.D.   On: 07/09/2016 16:31   US Abdomen Limited Ruq  Result Date: 07/16/2016 CLINICAL DATA:  Abnormal liver function. Abdominal pain since 07/09/2016. EXAM: ULTRASOUND ABDOMEN LIMITED RIGHT UPPER QUADRANT COMPARISON:  Abdominal MRI 07/10/2016 FINDINGS: Gallbladder: No gallstones or wall thickening visualized. No sonographic Murphy sign noted by sonographer. Common bile duct: Diameter: 5 mm.  Where visualized, no filling defect. Liver: No focal lesion identified. Echogenic liver with poor acoustic penetration consistent steatosis seen on prior CT and MRI. No evidence of mass lesion. IMPRESSION: 1. Negative gallbladder and common bile duct. 2. Hepatic steatosis. Electronically Signed   By: Monte Fantasia M.D.   On: 07/16/2016 08:56   US Abdomen Limited Ruq  Result Date: 07/09/2016 CLINICAL DATA:  Pancreatitis EXAM: ULTRASOUND ABDOMEN LIMITED RIGHT UPPER QUADRANT COMPARISON:  07/09/2016 FINDINGS: Gallbladder: No gallstones or wall thickening visualized. No sonographic Murphy sign noted by sonographer. Common bile duct:  Diameter: 2 mm Liver: Increased echogenicity, heterogenous consistent with fatty infiltration. Unable to visualize the right posterior hepatic lobe CT lesion. Difficult visualization of flow in the portal vessels likely due to fatty infiltration. IMPRESSION: 1. Negative for gallstones or biliary dilatation 2. Coarse heterogeneous echogenic liver consistent with fatty infiltration. Difficult visualization of portal vessels, probably due to fatty infiltration ; follow-up contrast-enhanced CT could be obtained to confirm patency of the portal vein. 3. Previously described right hepatic lobe lesion is not well seen by ultrasound. Electronically Signed   By: Donavan Foil M.D.   On: 07/09/2016 22:58    Micro Results     Recent Results (from the past 240 hour(s))  MRSA PCR Screening     Status: None   Collection Time: 07/10/16  6:59 AM  Result Value Ref Range Status   MRSA by PCR NEGATIVE NEGATIVE Final    Comment:        The GeneXpert MRSA Assay (FDA approved for NASAL specimens only), is one component of a comprehensive MRSA colonization surveillance program. It is not intended to diagnose MRSA infection nor to guide or monitor treatment for MRSA infections.   Culture, blood (routine x 2)     Status: None   Collection Time: 07/10/16  8:12 PM  Result Value Ref Range Status   Specimen Description BLOOD RIGHT ANTECUBITAL  Final   Special Requests   Final    BOTTLES DRAWN AEROBIC AND ANAEROBIC Blood Culture adequate volume   Culture NO GROWTH 5 DAYS  Final   Report Status 07/15/2016 FINAL  Final  Culture, blood (routine x 2)     Status: None   Collection Time: 07/10/16  8:21 PM  Result Value Ref Range Status   Specimen Description BLOOD LEFT ANTECUBITAL  Final   Special Requests   Final    BOTTLES DRAWN AEROBIC AND ANAEROBIC Blood Culture results may not be optimal due to an excessive volume of blood received in culture bottles   Culture NO GROWTH 5 DAYS  Final   Report Status  07/15/2016 FINAL  Final  Culture, Urine     Status: Abnormal   Collection Time: 07/10/16  8:24 PM  Result Value Ref Range Status   Specimen Description URINE, RANDOM  Final   Special Requests NONE  Final   Culture MULTIPLE SPECIES PRESENT, SUGGEST RECOLLECTION (A)  Final   Report Status 07/12/2016 FINAL  Final  Today   Subjective    Terri Morris today has no abdominal pain, tolerating diet.  No headache,no chest pain, ,no new weakness tingling or numbness, feels much better wants to go home today.    Objective   Blood pressure 103/62, pulse 94, temperature 98 F (36.7 C), temperature source Oral, resp. rate 16, height 5\' 11"  (1.803 m), weight 80.5 kg (177 lb 6.4 oz), last menstrual period 05/18/2016, SpO2 96 %.   Intake/Output Summary (Last 24 hours) at 07/17/16 0810 Last data filed at 07/17/16 0556  Gross per 24 hour  Intake              480 ml  Output              300 ml  Net              180 ml    Exam Awake Alert, Oriented x 3, No new F.N deficits, Normal affect [place].AT,PERRAL Supple Neck,No JVD, No cervical lymphadenopathy appriciated.  Symmetrical Chest wall movement, Good air movement bilaterally, CTAB RRR,No Gallops,Rubs or new Murmurs, No Parasternal Heave +ve B.Sounds, Abd Soft, Non tender, No organomegaly appriciated, No rebound -guarding or rigidity. No Cyanosis, Clubbing or edema, No new Rash or bruise   Data Review   CBC w Diff: Lab Results  Component Value Date   WBC 8.4 07/17/2016   HGB 12.3 07/17/2016   HGB 8.0 (L) 12/31/2015   HCT 36.3 07/17/2016   HCT 26.4 (L) 12/31/2015   PLT 494 (H) 07/17/2016   PLT 348 12/31/2015   LYMPHOPCT 10 07/09/2016   LYMPHOPCT 30.9 12/31/2015   MONOPCT 11 07/09/2016   MONOPCT 11.4 12/31/2015   EOSPCT 0 07/09/2016   EOSPCT 1.8 12/31/2015   BASOPCT 0 07/09/2016   BASOPCT 0.2 12/31/2015    CMP: Lab Results  Component Value Date   NA 131 (L) 07/17/2016   K 3.7 07/17/2016   CL 95 (L) 07/17/2016   CO2  26 07/17/2016   BUN <5 (L) 07/17/2016   CREATININE 0.62 07/17/2016   PROT 6.9 07/17/2016   ALBUMIN 3.3 (L) 07/17/2016   BILITOT 0.7 07/17/2016   ALKPHOS 61 07/17/2016   AST 198 (H) 07/17/2016   ALT 165 (H) 07/17/2016  .   Total Time in preparing paper work, data evaluation and todays exam - 70 minutes  Jani Gravel M.D on 07/17/2016 at 8:10 AM  [facility]   Office  [phone]

## 2016-07-18 ENCOUNTER — Telehealth: Payer: Self-pay | Admitting: Medical

## 2016-07-18 ENCOUNTER — Telehealth: Payer: Self-pay

## 2016-07-18 ENCOUNTER — Ambulatory Visit (INDEPENDENT_AMBULATORY_CARE_PROVIDER_SITE_OTHER): Payer: PRIVATE HEALTH INSURANCE | Admitting: Medical

## 2016-07-18 ENCOUNTER — Ambulatory Visit (HOSPITAL_BASED_OUTPATIENT_CLINIC_OR_DEPARTMENT_OTHER)
Admission: RE | Admit: 2016-07-18 | Discharge: 2016-07-18 | Disposition: A | Discharge status: Home / Self Care | Payer: PRIVATE HEALTH INSURANCE | Source: Ambulatory Visit | Attending: Medical | Admitting: Medical

## 2016-07-18 VITALS — Ht 71.0 in

## 2016-07-18 DIAGNOSIS — R748 Abnormal levels of other serum enzymes: Secondary | ICD-10-CM

## 2016-07-18 DIAGNOSIS — K769 Liver disease, unspecified: Secondary | ICD-10-CM | POA: Insufficient documentation

## 2016-07-18 DIAGNOSIS — I509 Heart failure, unspecified: Secondary | ICD-10-CM | POA: Diagnosis not present

## 2016-07-18 DIAGNOSIS — K861 Other chronic pancreatitis: Secondary | ICD-10-CM | POA: Diagnosis not present

## 2016-07-18 DIAGNOSIS — K828 Other specified diseases of gallbladder: Secondary | ICD-10-CM

## 2016-07-18 DIAGNOSIS — E118 Type 2 diabetes mellitus with unspecified complications: Secondary | ICD-10-CM

## 2016-07-18 LAB — HEPATITIS PANEL, ACUTE
HEP A IGM: NEGATIVE
HEP B C IGM: NEGATIVE
Hepatitis B Surface Ag: NEGATIVE

## 2016-07-18 LAB — AMMONIA: Ammonia: 44 umol/L — ABNORMAL HIGH (ref 11–35)

## 2016-07-18 MED ORDER — METFORMIN HCL 500 MG PO TABS: 500.0000 mg | ORAL_TABLET | Freq: Two times a day (BID) | ORAL | 0 refills | Status: DC

## 2016-07-18 NOTE — Patient Instructions (Addendum)
For your elevated liver enzymes will repeat cmp, ammonia and get ggt.  For hx of chf will get bnp today and want you to keep follow up with cardiologist. Get a scale and weight your self daily. Couneled on improtance of notifying us on weight gain or shortness of breath.  Will refer you to GI for evaluation of liver lesion, liver disease, pancreatitis, gallbadder sludge and clinically ascites. I want you to go down stairs to radiology for [place] to evaluate potential ascites. Stay on tramadol for diffuse abdomen discomfort. Hesitant to rx anything stronger if ascites the cause. Can take 2 tab tramadol if needed. If new symptom occur in abd let us know.  For your elevated sugar and liver enzyme elevation stay away from any alchohol. Make sure some protein in your diet. Low sugar diet as well. Discussed your sugar history with Dr. Charlett Blake. She agrees on not prescribing insulin. We want you to check sugar level 3-4 times daily and give me update on your readings this Friday am. I will prescribe metformin today.  For hyperkalemia will check electrolytes.  Will check pancrease enzymes as well.  Follow up this coming Monday or as needed.(ask for 30 minute appointment)

## 2016-07-18 NOTE — Progress Notes (Signed)
Subjective:    Patient ID: Terri Morris, female    DOB: Jun 23, 1975, 41 y.o.   MRN: [id number]  HPI  Pt in for follow up from ED and hospitalization.  Pt states initially felt nausea and vomiting on day of admisstion July 09, 2016.Marland Kitchen Pt was states had diagnosis of pancreatitis, acute kidney injury, and metabolic acidosis.  She was hospitalized and just recently discharge July 17, 2016.  Pt had some sludge in her gallbladder found on work up. Liver lesion as well but nonspecific.  Regarding pancreatitis diagnosist in past she  drank about 3 beers a day(she states has done this for years). Pt did have some liver enzyme elevation in ED and in the hospital. Ammonia level was high and was given lactulose. She is still on lactulose. She has not had any itching of skin. Hepatitis testing was negative on review.  Pt lipase still high yesterday.  Pt is on b1, folic acid, protonix, lactulose and nicotine patch.  Pt a1-c was 6.1.  8 days ago. Sugars high in hospital given insulin but not discharge on any. Pt was under the impression should be on insulin. She was educated on how to use insulin but no med on dc. No glucometer given as well.   Pt was advised to follow up with cardiologist to evaluation chf(diastolic). Cardiologist  thought maybe related to aggressive overhydration. Per pt hospitalist wanted to hydrate her but cardiologist thought adverse effect chf. Pt had no swelling of legs. No sob. She has not been weighing her self.   Pt has some distension of abdomen. This is new which came on hospital admission. Pt feels like abdomen more bloated than before. Mild diffuse pain.     Review of Systems  Constitutional: Negative for chills, fatigue and unexpected weight change.  Respiratory: Negative for cough, chest tightness, shortness of breath and wheezing.   Cardiovascular: Negative for chest pain and palpitations.  Gastrointestinal: Negative for abdominal distention, constipation,  diarrhea and rectal pain.       Mild diffuse pain. Pt states bloated appearance of abdomen occurred while in hospital.  Genitourinary: Negative for difficulty urinating, flank pain, frequency, pelvic pain and urgency.  Musculoskeletal: Negative for back pain.  Skin: Negative for rash.  Neurological: Negative for dizziness, syncope, weakness, numbness and headaches.  Hematological: Negative for adenopathy. Does not bruise/bleed easily.  Psychiatric/Behavioral: Negative for behavioral problems, confusion and sleep disturbance. The patient is not nervous/anxious.     Past Medical History:  Diagnosis Date  . AKI (acute kidney injury) ([place]) 07/09/2016  . Anxiety   . HLD (hyperlipidemia) 07/09/2016  . Hypertension   . Thyroid disease   . Vaginal Pap smear, abnormal      Social History   Social History  . Marital status: Married    Spouse name: N/A  . Number of children: N/A  . Years of education: N/A   Occupational History  . Not on file.   Social History Main Topics  . Smoking status: Heavy Tobacco Smoker    Packs/day: 0.50    Years: 24.00    Types: Cigarettes  . Smokeless tobacco: Never Used  . Alcohol use 1.8 oz/week    3 Cans of beer per week     Comment: 3 glasses of wine on weekends  . Drug use: No  . Sexual activity: Yes    Birth control/ protection: None   Other Topics Concern  . Not on file   Social History Narrative  . No narrative  on file    Past Surgical History:  Procedure Laterality Date  . ECTOPIC PREGNANCY SURGERY    . LEEP      Family History  Problem Relation Age of Onset  . Hypertension Mother   . Stroke Mother   . Arthritis Mother   . Alcohol abuse Mother   . Diabetes Mother   . Hypertension Father   . Alcohol abuse Father   . Diabetes Father     Allergies  Allergen Reactions  . Ferrous Sulfate Other (See Comments)    Severe stomach pains and constipation    Current Outpatient Prescriptions on File Prior to Visit  Medication Sig  Dispense Refill  . amLODipine (NORVASC) 5 MG tablet Take 1 tablet (5 mg total) by mouth daily. (Patient taking differently: Take 5 mg by mouth at bedtime. ) 90 tablet 1  . aspirin EC 81 MG tablet Take 162 mg by mouth daily.    . ferrous sulfate 325 (65 FE) MG EC tablet Take 1 tablet (325 mg total) by mouth 3 (three) times daily with meals. 90 tablet 2  . folic acid (FOLVITE) 1 MG tablet Take 1 tablet (1 mg total) by mouth daily. 30 tablet 0  . lactulose (CHRONULAC) 10 GM/15ML solution Take 45 mLs (30 g total) by mouth 2 (two) times daily. 240 mL 0  . lisinopril (PRINIVIL,ZESTRIL) 20 MG tablet TAKE 1 TABLET(20 MG) BY MOUTH DAILY (Patient taking differently: Take 20 mg by mouth daily. ) 90 tablet 1  . nicotine (NICODERM CQ - DOSED IN MG/24 HOURS) 21 mg/24hr patch Place 1 patch (21 mg total) onto the skin daily. 28 patch 0  . pantoprazole (PROTONIX) 40 MG tablet Take 1 tablet (40 mg total) by mouth daily at 12 noon. 30 tablet 0  . thiamine 100 MG tablet Take 1 tablet (100 mg total) by mouth daily. 30 tablet 0  . traMADol (ULTRAM) 50 MG tablet Take 1 tablet (50 mg total) by mouth every 6 (six) hours as needed for moderate pain. 30 tablet 0   No current facility-administered medications on file prior to visit.     Ht 5\' 11"  (1.803 m)       Objective:   Physical Exam  General Mental Status- Alert. General Appearance- Not in acute distress.   Skin General: Color- Normal Color. Moisture- Normal Moisture.  Neck Carotid Arteries- Normal color. Moisture- Normal Moisture. No carotid bruits. No JVD.  Chest and Lung Exam Auscultation: Breath Sounds:-Normal.  Cardiovascular Auscultation:Rythm- Regular. Murmurs & Other Heart Sounds:Auscultation of the heart reveals- No Murmurs.  Abdomen Inspection:-Inspeection Normal. Palpation/Percussion:Note:No mass. Palpation and Percussion of the abdomen reveal- Non Tender but on inspection and palpation has mild ascitic appearance,  + BS, no rebound or  guarding.   Neurologic Cranial Nerve exam:- CN III-XII intact(No nystagmus), symmetric smile. Strength:- 5/5 equal and symmetric strength both upper and lower extremities.   Lower ext- no pedal edema. Negative homans signs.     Assessment & Plan:  For your elevated liver enzymes will repeat cmp and get ggt.  For hx of chf will get bnp today and want you to keep follow up with cardiologist. Get a scale and weight your self daily.  Will refer you to GI for evaluation of liver lesion, liver disease, pancreatitis, gallbadder sludge and clinically ascites. I want you to go down stairs to radiology for [place] to evaluate potential ascites. Stay on tramadol for diffuse abdomen discomfort. Hesitant to rx anything stronger if ascites the cause. Can  take 2 tab tramadol if needed. If new symptom occur in abd let us know.  For your elevated sugar and liver enzyme elevation stay away from any alchohol. Make sure some protein in your diet. Low sugar diet as well. Discussed your sugar history with Dr. Charlett Blake. She agrees on not prescribing insulin. We want you to check sugar level 3-4 times daily and give me update on your readings this Friday am. I will prescribe metformin today.  Follow up this coming Monday or as needed.(ask for 30 minute appointment)  Long discussion with pt on importance of checking blood sugars and letting us know results on follow up. If she gets any sugar above 300 let us know before follow up.  Also explained extreme importance on using no alcohol use and potential severe complications that could result if continues to use.  Daiya Tamer, Percell Miller, PA-C

## 2016-07-18 NOTE — Telephone Encounter (Signed)
07/18/16   Hospital Follow Up  Transition Care Management Follow-up Telephone Call  ADMISSION DATE:   DISCHARGE DATE:    How have you been since you were released from the hospital? Patient states she has been in a lot of pain. Seeing provider today for hospital follow up.    Do you understand why you were in the hospital? Yes   Do you understand the discharge instrcutions? Yes   Yes just did not understand why she was not discharged with RX's.    Items Reviewed:  Medications reviewed: Yes  Allergies reviewed: NKDA  Dietary changes reviewed: Heart Healthy   Referrals reviewed: Yes, Appointment scheduled for today with Mackie Pai, PA   Functional Questionnaire:   Activities of Daily Living (ADLs): Can perform all independently.  Any patient concerns?  Pain. Medications she needs to be taking.   Confirmed importance and date/time of follow-up visits scheduled: Yes Confirmed with patient if condition begins to worsen call PCP or go to the ER. Yes   Patient was given the office number and encouragred to call back with questions or concerns. Yes

## 2016-07-18 NOTE — Telephone Encounter (Signed)
I put in pancrease enzyme amylase and lipase at the end of exam. But did not sign. Can you run this tomorrow. Please let me know. Sorry for updating you late.

## 2016-07-19 ENCOUNTER — Telehealth: Payer: Self-pay | Admitting: Medical

## 2016-07-19 LAB — GAMMA GT: GGT: 268 U/L — AB (ref 7–51)

## 2016-07-19 LAB — CBC WITH DIFFERENTIAL/PLATELET
BASOS PCT: 1.2 % (ref 0.0–3.0)
Basophils Absolute: 0.1 10*3/uL (ref 0.0–0.1)
EOS ABS: 0.1 10*3/uL (ref 0.0–0.7)
EOS PCT: 1.7 % (ref 0.0–5.0)
HCT: 40.1 % (ref 36.0–46.0)
HEMOGLOBIN: 13.9 g/dL (ref 12.0–15.0)
LYMPHS ABS: 2.1 10*3/uL (ref 0.7–4.0)
Lymphocytes Relative: 24.7 % (ref 12.0–46.0)
MCHC: 34.8 g/dL (ref 30.0–36.0)
MCV: 106.2 fl — ABNORMAL HIGH (ref 78.0–100.0)
MONO ABS: 1.9 10*3/uL — AB (ref 0.1–1.0)
Monocytes Relative: 22.6 % — ABNORMAL HIGH (ref 3.0–12.0)
NEUTROS PCT: 49.8 % (ref 43.0–77.0)
Neutro Abs: 4.2 10*3/uL (ref 1.4–7.7)
Platelets: 775 10*3/uL — ABNORMAL HIGH (ref 150.0–400.0)
RBC: 3.77 Mil/uL — ABNORMAL LOW (ref 3.87–5.11)
RDW: 17.9 % — AB (ref 11.5–15.5)
WBC: 8.5 10*3/uL (ref 4.0–10.5)

## 2016-07-19 LAB — COMPREHENSIVE METABOLIC PANEL
ALBUMIN: 4.3 g/dL (ref 3.5–5.2)
ALK PHOS: 77 U/L (ref 39–117)
ALT: 227 U/L — ABNORMAL HIGH (ref 0–35)
AST: 318 U/L — AB (ref 0–37)
BUN: 4 mg/dL — AB (ref 6–23)
CHLORIDE: 93 meq/L — AB (ref 96–112)
CO2: 28 mEq/L (ref 19–32)
Calcium: 10.6 mg/dL — ABNORMAL HIGH (ref 8.4–10.5)
Creatinine, Ser: 0.66 mg/dL (ref 0.40–1.20)
GFR: 125.72 mL/min (ref >60.00)
GLUCOSE: 149 mg/dL — AB (ref 70–99)
Potassium: 4.1 mEq/L (ref 3.5–5.1)
SODIUM: 133 meq/L — AB (ref 135–145)
TOTAL PROTEIN: 8.5 g/dL — AB (ref 6.0–8.3)
Total Bilirubin: 0.7 mg/dL (ref 0.2–1.2)

## 2016-07-19 LAB — BRAIN NATRIURETIC PEPTIDE: PRO B NATRI PEPTIDE: 30 pg/mL (ref 0.0–100.0)

## 2016-07-19 NOTE — Telephone Encounter (Signed)
Will you call them lab tomorrow to make sure they got the fax.

## 2016-07-19 NOTE — Telephone Encounter (Signed)
Where you able to add amylase and lipase I sent message last night. Please let me know.

## 2016-07-19 NOTE — Telephone Encounter (Signed)
Add on sheet faxed 

## 2016-07-19 NOTE — Telephone Encounter (Signed)
Pt is a new relatively new diabetic. We gave glucometer other day. She is going to get ct tomorrow of abdomen and labs. But she needs to be educated on how to use her glucometer. Can someone show her when she is in for lab and actually get her to demonstrate she can check successfully.

## 2016-07-19 NOTE — Addendum Note (Signed)
Addended by: Caffie Pinto on: 07/19/2016 04:53 PM   Modules accepted: Orders

## 2016-07-19 NOTE — Telephone Encounter (Signed)
Hey Terri Morris I'm not sure why I never got this message but the Add on has been faxed to [facility].

## 2016-07-19 NOTE — Telephone Encounter (Signed)
I double checked the paper work in the lab, Shanon Brow had faxed the add on earlier today but maybe it did not go through, but I did just fax it again.

## 2016-07-20 ENCOUNTER — Telehealth: Payer: Self-pay | Admitting: Medical

## 2016-07-20 ENCOUNTER — Other Ambulatory Visit (INDEPENDENT_AMBULATORY_CARE_PROVIDER_SITE_OTHER): Payer: PRIVATE HEALTH INSURANCE

## 2016-07-20 DIAGNOSIS — K861 Other chronic pancreatitis: Secondary | ICD-10-CM

## 2016-07-20 DIAGNOSIS — K859 Acute pancreatitis without necrosis or infection, unspecified: Secondary | ICD-10-CM

## 2016-07-20 LAB — AMYLASE: AMYLASE: 25 U/L — AB (ref 27–131)

## 2016-07-20 LAB — LIPASE: LIPASE: 395 U/L — AB (ref 11.0–59.0)

## 2016-07-20 NOTE — Telephone Encounter (Signed)
Please cancel the request for education on operating machine. I accidentaly place note in the wrong chart.

## 2016-07-20 NOTE — Telephone Encounter (Signed)
SHe will need to be put on Nurse Visit schedule for patient teaching.

## 2016-07-21 NOTE — Telephone Encounter (Signed)
I was not here yesterday but rsults are complete for Amylase & Lipase. KMP

## 2016-07-21 NOTE — Telephone Encounter (Signed)
Per E. Saguier note dated 07/20/16 for patient teaching placed in patients chart in error.

## 2016-07-22 ENCOUNTER — Telehealth: Payer: Self-pay | Admitting: Medical

## 2016-07-22 ENCOUNTER — Encounter: Payer: Self-pay | Admitting: Internal Medicine

## 2016-07-22 NOTE — Telephone Encounter (Signed)
GI has been trying to call pt. I think they left message. Will you call pt and remind/ask if she has called. Document she was advised. If she called when is her appointment.

## 2016-07-22 NOTE — Telephone Encounter (Signed)
Called pt and provided ph# for [facility] per request from Percell Miller (tel notes)

## 2016-07-25 ENCOUNTER — Other Ambulatory Visit: Payer: Self-pay | Admitting: Medical

## 2016-07-25 ENCOUNTER — Ambulatory Visit (INDEPENDENT_AMBULATORY_CARE_PROVIDER_SITE_OTHER): Payer: PRIVATE HEALTH INSURANCE | Admitting: Medical

## 2016-07-25 VITALS — BP 102/60 | HR 99 | Temp 98.7°F | Resp 16 | Ht 70.0 in | Wt 170.8 lb

## 2016-07-25 DIAGNOSIS — R739 Hyperglycemia, unspecified: Secondary | ICD-10-CM

## 2016-07-25 DIAGNOSIS — I10 Essential (primary) hypertension: Secondary | ICD-10-CM

## 2016-07-25 DIAGNOSIS — E119 Type 2 diabetes mellitus without complications: Secondary | ICD-10-CM | POA: Diagnosis not present

## 2016-07-25 DIAGNOSIS — R634 Abnormal weight loss: Secondary | ICD-10-CM | POA: Diagnosis not present

## 2016-07-25 DIAGNOSIS — K861 Other chronic pancreatitis: Secondary | ICD-10-CM

## 2016-07-25 DIAGNOSIS — R109 Unspecified abdominal pain: Secondary | ICD-10-CM

## 2016-07-25 DIAGNOSIS — R748 Abnormal levels of other serum enzymes: Secondary | ICD-10-CM

## 2016-07-25 LAB — POC URINALSYSI DIPSTICK (AUTOMATED)
Bilirubin, UA: NEGATIVE
Blood, UA: NEGATIVE
GLUCOSE UA: NEGATIVE
Ketones, UA: NEGATIVE
LEUKOCYTES UA: NEGATIVE
Nitrite, UA: NEGATIVE
Protein, UA: NEGATIVE
UROBILINOGEN UA: 0.2 U/dL
pH, UA: 6 (ref 5.0–8.0)

## 2016-07-25 MED ORDER — TRAMADOL HCL 50 MG PO TABS: 50.0000 mg | ORAL_TABLET | Freq: Four times a day (QID) | ORAL | 0 refills | Status: DC | PRN

## 2016-07-25 NOTE — Progress Notes (Signed)
PCP: Primary HF Cardiologist:  HPI: Ms Ewalt is a 41 year old with a history of diastolic heart failure, pancreatitis, ETOH, hyperlipidemia, and HTN.   Admitted to [facility] July of this year with volume overload and pancreatitis. Diuresed with IV lasix then transitioned to lasix as needed for 3-5 pound weight gain. Weight at that time was 175 pounds.   Today she returns for post hospital follow up. Overall feeling ok. Denies SOB/Orthopnea. Weight at home 170 pounds. Appetite. No fever. Taking all medications. She has not been drinking alcohol.   ROS: All systems negative except as listed in HPI, PMH and Problem List.  SH:  Social History   Social History  . Marital status: Married    Spouse name: N/A  . Number of children: N/A  . Years of education: N/A   Occupational History  . Not on file.   Social History Main Topics  . Smoking status: Heavy Tobacco Smoker    Packs/day: 0.50    Years: 24.00    Types: Cigarettes  . Smokeless tobacco: Never Used  . Alcohol use 1.8 oz/week    3 Cans of beer per week     Comment: 3 glasses of wine on weekends  . Drug use: No  . Sexual activity: Yes    Birth control/ protection: None   Other Topics Concern  . Not on file   Social History Narrative  . No narrative on file    FH:  Family History  Problem Relation Age of Onset  . Hypertension Mother   . Stroke Mother   . Arthritis Mother   . Alcohol abuse Mother   . Diabetes Mother   . Hypertension Father   . Alcohol abuse Father   . Diabetes Father     Past Medical History:  Diagnosis Date  . AKI (acute kidney injury) ([place]) 07/09/2016  . Anxiety   . HLD (hyperlipidemia) 07/09/2016  . Hypertension   . Thyroid disease   . Vaginal Pap smear, abnormal     Current Outpatient Prescriptions  Medication Sig Dispense Refill  . amLODipine (NORVASC) 5 MG tablet Take 2.5 mg by mouth daily.    Marland Kitchen aspirin EC 81 MG tablet Take 162 mg by mouth daily.    . ferrous sulfate 325 (65  FE) MG EC tablet Take 1 tablet (325 mg total) by mouth 3 (three) times daily with meals. 90 tablet 2  . folic acid (FOLVITE) 1 MG tablet Take 1 tablet (1 mg total) by mouth daily. 30 tablet 0  . lisinopril (PRINIVIL,ZESTRIL) 20 MG tablet TAKE 1 TABLET(20 MG) BY MOUTH DAILY (Patient taking differently: Take 20 mg by mouth daily. ) 90 tablet 1  . metFORMIN (GLUCOPHAGE) 500 MG tablet Take 1 tablet (500 mg total) by mouth 2 (two) times daily with a meal. 60 tablet 0  . nicotine (NICODERM CQ - DOSED IN MG/24 HOURS) 21 mg/24hr patch Place 1 patch (21 mg total) onto the skin daily. 28 patch 0  . pantoprazole (PROTONIX) 40 MG tablet Take 1 tablet (40 mg total) by mouth daily at 12 noon. 30 tablet 0  . thiamine 100 MG tablet Take 1 tablet (100 mg total) by mouth daily. 30 tablet 0  . traMADol (ULTRAM) 50 MG tablet Take 1-2 tablets (50-100 mg total) by mouth every 6 (six) hours as needed (pain). 24 tablet 0   No current facility-administered medications for this encounter.     Vitals:   07/26/16 1151  BP: 100/62  Pulse: Marland Kitchen)  107  SpO2: 99%  Weight: 169 lb 3.2 oz (76.7 kg)    PHYSICAL EXAM:  General:  Well appearing. No resp difficulty HEENT: normal Neck: supple. JVP flat. Carotids 2+ bilaterally; no bruits. No lymphadenopathy or thryomegaly appreciated. Cor: PMI normal. Regular rate & rhythm. No rubs, gallops or murmurs. Lungs: clear Abdomen: soft, nontender, nondistended. No hepatosplenomegaly. No bruits or masses. Good bowel sounds. Extremities: no cyanosis, clubbing, rash, edema Neuro: alert & orientedx3, cranial nerves grossly intact. Moves all 4 extremities w/o difficulty. Affect pleasant.  ASSESSMENT & PLAN:  1. Chronic Diastolic HF Volume status stable. Continue current diuretic regimen. Instructed to take an extra lasix for 5 pound weight gain.  Do the following things EVERYDAY: 1) Weigh yourself in the morning before breakfast. Write it down and keep it in a log. 2) Take your  medicines as prescribed 3) Eat low salt foods-Limit salt (sodium) to 2000 mg per day.  4) Stay as active as you can everyday 5) Limit all fluids for the day to less than 2 liters 2. HTN- Stable.  3.  ETOH abuse- counseled to stop drinking alcohol.  4. H/O AKI Check BMET today.   Follow up in 6 months.   Amy Clegg NP-C

## 2016-07-25 NOTE — Progress Notes (Signed)
Subjective:    Patient ID: Terri Morris, female    DOB: 09/07/1975, 41 y.o.   MRN: [id number]  HPI  Pt in for follow up.   Pt states she feels fine. She states no obvious fatigue. She has been taking it easy.  She has not been drinking any alcohol since last visit.  Pt hepatitis studies in hospital were negative.   She is scheduled to see GI on August 31st. Pt not sure which GI MD she will see but does have appointment.  Prior visit [place] abd negative for ascites.  Pt seen by gynecologist. Pt had pap before and sent to gyn for further work up. Seen in June 2018. Told to repeat in December this year.  Mammogram was normal. Chest xray in hospital was negative. Pt stools don't look black or bloody. But no ifob done recently.    Recent blood sugar readings fating 157, 142, 161 and 166. Other nonfasting 252, 161, and 215. She is on metformin twice a day.    Review of Systems  Constitutional: Negative for chills and fatigue.  HENT: Negative for congestion, ear discharge, ear pain, nosebleeds, rhinorrhea and sinus pressure.   Respiratory: Negative for cough, chest tightness, shortness of breath and wheezing.   Cardiovascular: Negative for chest pain and palpitations.  Gastrointestinal: Negative for abdominal pain, blood in stool, constipation and nausea.       Rare transient rt flank pain.  Genitourinary: Negative for difficulty urinating, dysuria, flank pain and frequency.  Musculoskeletal: Negative for back pain, gait problem and myalgias.  Skin: Negative for rash.       No skin itching.  Psychiatric/Behavioral: Negative for behavioral problems and confusion.   Past Medical History:  Diagnosis Date  . AKI (acute kidney injury) ([place]) 07/09/2016  . Anxiety   . HLD (hyperlipidemia) 07/09/2016  . Hypertension   . Thyroid disease   . Vaginal Pap smear, abnormal      Social History   Social History  . Marital status: Married    Spouse name: N/A  . Number of children:  N/A  . Years of education: N/A   Occupational History  . Not on file.   Social History Main Topics  . Smoking status: Heavy Tobacco Smoker    Packs/day: 0.50    Years: 24.00    Types: Cigarettes  . Smokeless tobacco: Never Used  . Alcohol use 1.8 oz/week    3 Cans of beer per week     Comment: 3 glasses of wine on weekends  . Drug use: No  . Sexual activity: Yes    Birth control/ protection: None   Other Topics Concern  . Not on file   Social History Narrative  . No narrative on file    Past Surgical History:  Procedure Laterality Date  . ECTOPIC PREGNANCY SURGERY    . LEEP      Family History  Problem Relation Age of Onset  . Hypertension Mother   . Stroke Mother   . Arthritis Mother   . Alcohol abuse Mother   . Diabetes Mother   . Hypertension Father   . Alcohol abuse Father   . Diabetes Father     Allergies  Allergen Reactions  . Ferrous Sulfate Other (See Comments)    Severe stomach pains and constipation    Current Outpatient Prescriptions on File Prior to Visit  Medication Sig Dispense Refill  . amLODipine (NORVASC) 5 MG tablet Take 1 tablet (5 mg total) by mouth  daily. (Patient taking differently: Take 5 mg by mouth at bedtime. ) 90 tablet 1  . aspirin EC 81 MG tablet Take 162 mg by mouth daily.    . ferrous sulfate 325 (65 FE) MG EC tablet Take 1 tablet (325 mg total) by mouth 3 (three) times daily with meals. 90 tablet 2  . folic acid (FOLVITE) 1 MG tablet Take 1 tablet (1 mg total) by mouth daily. 30 tablet 0  . lactulose (CHRONULAC) 10 GM/15ML solution Take 45 mLs (30 g total) by mouth 2 (two) times daily. 240 mL 0  . lisinopril (PRINIVIL,ZESTRIL) 20 MG tablet TAKE 1 TABLET(20 MG) BY MOUTH DAILY (Patient taking differently: Take 20 mg by mouth daily. ) 90 tablet 1  . metFORMIN (GLUCOPHAGE) 500 MG tablet Take 1 tablet (500 mg total) by mouth 2 (two) times daily with a meal. 60 tablet 0  . nicotine (NICODERM CQ - DOSED IN MG/24 HOURS) 21 mg/24hr  patch Place 1 patch (21 mg total) onto the skin daily. 28 patch 0  . pantoprazole (PROTONIX) 40 MG tablet Take 1 tablet (40 mg total) by mouth daily at 12 noon. 30 tablet 0  . thiamine 100 MG tablet Take 1 tablet (100 mg total) by mouth daily. 30 tablet 0  . traMADol (ULTRAM) 50 MG tablet Take 1 tablet (50 mg total) by mouth every 6 (six) hours as needed for moderate pain. 30 tablet 0   No current facility-administered medications on file prior to visit.     BP 102/60   Pulse 99   Temp 98.7 F (37.1 C) (Oral)   Resp 16   Ht 5\' 10"  (1.778 m)   Wt 170 lb 12.8 oz (77.5 kg)   SpO2 99%   BMI 24.51 kg/m       Objective:   Physical Exam   General Mental Status- Alert. General Appearance- Not in acute distress.   Skin General: Color- Normal Color. Moisture- Normal Moisture.  Neck Carotid Arteries- Normal color. Moisture- Normal Moisture. No carotid bruits. No JVD.  Chest and Lung Exam Auscultation: Breath Sounds:-Normal.  Cardiovascular Auscultation:Rythm- Regular. Murmurs & Other Heart Sounds:Auscultation of the heart reveals- No Murmurs.  Abdomen Inspection:-Inspeection Normal. Palpation/Percussion:Note:No mass. Palpation and Percussion of the abdomen reveal- Non Tender, mild  Distended(still mild bloated/appearance like mild ascites possible(but abd [place] was negative) + BS, no rebound or guarding.   Neurologic Cranial Nerve exam:- CN III-XII intact(No nystagmus), symmetric smile. Strength:- 5/5 equal and symmetric strength both upper and lower extremities.     Assessment & Plan:  For recent liver enzyme elevation repeating labs to make sure stable/not increasing.  Please turn in the ifob test to see if you have any blood in your stool. Very important to do test.  Please give urine sample to check for blood   Continue metformin twice daily. Keep checking sugars and update [place] on readings this Friday.  Your bp is low today. Start amlodipine just 2.5 mg a day. Split  the 5 mg tab. See cardiologist tomorrow. He may decide to cut back on lisinopril as well.  Follow up in 2 weeks or as needed  If ammonia elevated will refill lactulose.  Will follow studies and if severe worse labs try to expedite the gi referral.  Refill another short course of tramadol. Note hesitant to write this chronically but do understand abd pain uncomfortable.  Again discussed and explained to pt extreme importance no alcohol use.  Conor Filsaime, Percell Miller, PA-C

## 2016-07-25 NOTE — Patient Instructions (Addendum)
For recent liver enzyme elevation repeating labs to make sure stable/not increasing.  Please turn in the ifob test to see if you have any blood in your stool. Very important to do test.  Please give urine sample to check for blood   Continue metformin twice daily. Keep checking sugars and update [place] on readings this Friday.  Your bp is low today. Start amlodipine just 2.5 mg a day. Split the 5 mg tab. See caridologist tomorrow. He may decide to cut back on lisinopril as well.  Follow up in 2 weeks or as needed  If ammonia elevated will refill lactulose.  Will follow studies and if severe worse labs try to expedite the gi referral.  Refill another short course of tramadol. Note hesitant to write this chronically but do understand abd pain uncomfortable.  Again discussed and explained to pt extreme importance no alcohol use.

## 2016-07-26 ENCOUNTER — Telehealth: Payer: Self-pay | Admitting: Medical

## 2016-07-26 ENCOUNTER — Ambulatory Visit (HOSPITAL_COMMUNITY)
Admission: RE | Admit: 2016-07-26 | Discharge: 2016-07-26 | Disposition: A | Discharge status: Home / Self Care | Payer: PRIVATE HEALTH INSURANCE | Source: Ambulatory Visit | Attending: Cardiology | Admitting: Cardiology

## 2016-07-26 ENCOUNTER — Encounter (HOSPITAL_COMMUNITY): Payer: Self-pay

## 2016-07-26 ENCOUNTER — Telehealth (HOSPITAL_COMMUNITY): Payer: Self-pay

## 2016-07-26 VITALS — BP 100/62 | HR 107 | Wt 169.2 lb

## 2016-07-26 DIAGNOSIS — F17210 Nicotine dependence, cigarettes, uncomplicated: Secondary | ICD-10-CM | POA: Insufficient documentation

## 2016-07-26 DIAGNOSIS — I10 Essential (primary) hypertension: Secondary | ICD-10-CM

## 2016-07-26 DIAGNOSIS — K859 Acute pancreatitis without necrosis or infection, unspecified: Secondary | ICD-10-CM | POA: Insufficient documentation

## 2016-07-26 DIAGNOSIS — E785 Hyperlipidemia, unspecified: Secondary | ICD-10-CM | POA: Diagnosis not present

## 2016-07-26 DIAGNOSIS — N179 Acute kidney failure, unspecified: Secondary | ICD-10-CM | POA: Insufficient documentation

## 2016-07-26 DIAGNOSIS — Z79899 Other long term (current) drug therapy: Secondary | ICD-10-CM | POA: Insufficient documentation

## 2016-07-26 DIAGNOSIS — I110 Hypertensive heart disease with heart failure: Secondary | ICD-10-CM | POA: Insufficient documentation

## 2016-07-26 DIAGNOSIS — Z7984 Long term (current) use of oral hypoglycemic drugs: Secondary | ICD-10-CM | POA: Diagnosis not present

## 2016-07-26 DIAGNOSIS — Z7982 Long term (current) use of aspirin: Secondary | ICD-10-CM | POA: Insufficient documentation

## 2016-07-26 DIAGNOSIS — D473 Essential (hemorrhagic) thrombocythemia: Secondary | ICD-10-CM

## 2016-07-26 DIAGNOSIS — R634 Abnormal weight loss: Secondary | ICD-10-CM

## 2016-07-26 DIAGNOSIS — D75839 Thrombocytosis, unspecified: Secondary | ICD-10-CM

## 2016-07-26 DIAGNOSIS — F1010 Alcohol abuse, uncomplicated: Secondary | ICD-10-CM | POA: Insufficient documentation

## 2016-07-26 DIAGNOSIS — I5032 Chronic diastolic (congestive) heart failure: Secondary | ICD-10-CM | POA: Diagnosis not present

## 2016-07-26 LAB — COMPREHENSIVE METABOLIC PANEL
ALBUMIN: 4 g/dL (ref 3.5–5.2)
ALT: 70 U/L — ABNORMAL HIGH (ref 0–35)
AST: 54 U/L — AB (ref 0–37)
Alkaline Phosphatase: 58 U/L (ref 39–117)
BILIRUBIN TOTAL: 0.5 mg/dL (ref 0.2–1.2)
BUN: 5 mg/dL — AB (ref 6–23)
CALCIUM: 10.4 mg/dL (ref 8.4–10.5)
CHLORIDE: 101 meq/L (ref 96–112)
CO2: 24 meq/L (ref 19–32)
Creatinine, Ser: 0.68 mg/dL (ref 0.40–1.20)
GFR: 121.45 mL/min (ref >60.00)
Glucose, Bld: 99 mg/dL (ref 70–99)
Potassium: 4.2 mEq/L (ref 3.5–5.1)
SODIUM: 134 meq/L — AB (ref 135–145)
Total Protein: 7.5 g/dL (ref 6.0–8.3)

## 2016-07-26 LAB — CBC WITH DIFFERENTIAL/PLATELET
BASOS PCT: 1.4 % (ref 0.0–3.0)
Basophils Absolute: 0.1 10*3/uL (ref 0.0–0.1)
Eosinophils Absolute: 0.2 10*3/uL (ref 0.0–0.7)
Eosinophils Relative: 2.2 % (ref 0.0–5.0)
HEMATOCRIT: 40.5 % (ref 36.0–46.0)
Hemoglobin: 13.7 g/dL (ref 12.0–15.0)
LYMPHS ABS: 2.4 10*3/uL (ref 0.7–4.0)
LYMPHS PCT: 24.5 % (ref 12.0–46.0)
MCHC: 33.9 g/dL (ref 30.0–36.0)
MCV: 107 fl — AB (ref 78.0–100.0)
MONOS PCT: 4.9 % (ref 3.0–12.0)
Monocytes Absolute: 0.5 10*3/uL (ref 0.1–1.0)
NEUTROS ABS: 6.6 10*3/uL (ref 1.4–7.7)
NEUTROS PCT: 67 % (ref 43.0–77.0)
PLATELETS: 664 10*3/uL — AB (ref 150.0–400.0)
RBC: 3.78 Mil/uL — ABNORMAL LOW (ref 3.87–5.11)
RDW: 17.4 % — AB (ref 11.5–15.5)
WBC: 9.9 10*3/uL (ref 4.0–10.5)

## 2016-07-26 LAB — LIPASE: LIPASE: 216 U/L — AB (ref 11.0–59.0)

## 2016-07-26 LAB — GAMMA GT: GGT: 159 U/L — ABNORMAL HIGH (ref 7–51)

## 2016-07-26 LAB — AMYLASE: AMYLASE: 22 U/L — AB (ref 27–131)

## 2016-07-26 MED ORDER — FUROSEMIDE 20 MG PO TABS: 20.0000 mg | ORAL_TABLET | ORAL | 3 refills | Status: DC | PRN

## 2016-07-26 NOTE — Telephone Encounter (Signed)
Referral to hematologist.

## 2016-07-26 NOTE — Patient Instructions (Signed)
START taking lasix 20 mg (1 Tablet) as needed for weight greater than 178 lbs.  Follow up in 6 months, we will contact you to schedule appointment.

## 2016-07-26 NOTE — Telephone Encounter (Signed)
[facility] appointment reminder call placed to patient for upcoming post-hospital follow up.  LVMTCB to confirm apt.  Patient also reminded to take all medications as prescribed on the day of his/her appointment and to bring all medications to this appointment.  Advised to call our office for tardiness or cancellations/rescheduling needs.  .Bradley, Megan Genevea  

## 2016-07-27 ENCOUNTER — Telehealth: Payer: Self-pay | Admitting: *Deleted

## 2016-07-27 NOTE — Telephone Encounter (Signed)
Received STD paperwork on Monday 07/25/16, for Terri Morris without DOB; could not locate in Epic, so called [facility] to speak with sender. Informed that Ms. Terri Morris was out of the office on vacation all this week, but that I could leave a message on her VM as she would be checking it. LMOM with contact name and number for return call RE: clarification on paperwork. Discovered patient to be employee at [employer] and verified information; paperwork has been started and it is very detailed, so it will be most likely Friday before it is finished/SLS 07/18

## 2016-07-28 ENCOUNTER — Telehealth: Payer: Self-pay | Admitting: Medical

## 2016-07-28 ENCOUNTER — Other Ambulatory Visit: Payer: Self-pay | Admitting: *Deleted

## 2016-07-28 MED ORDER — LACTULOSE 10 GM/15ML PO SOLN: 30.0000 g | Freq: Two times a day (BID) | ORAL | 0 refills | Status: DC

## 2016-07-28 NOTE — Telephone Encounter (Signed)
I did go ahead and refill her lactulose for high ammonia. Notify pt please.  Has she had any itching? Also remind her no alcohol at all.  When is her appointment with GI?

## 2016-07-28 NOTE — Telephone Encounter (Signed)
Rx lactulose sent.

## 2016-07-29 ENCOUNTER — Encounter: Payer: Self-pay | Admitting: Medical

## 2016-07-29 ENCOUNTER — Ambulatory Visit (HOSPITAL_BASED_OUTPATIENT_CLINIC_OR_DEPARTMENT_OTHER): Payer: PRIVATE HEALTH INSURANCE | Admitting: Hematology & Oncology

## 2016-07-29 ENCOUNTER — Other Ambulatory Visit (HOSPITAL_BASED_OUTPATIENT_CLINIC_OR_DEPARTMENT_OTHER): Payer: PRIVATE HEALTH INSURANCE

## 2016-07-29 VITALS — BP 112/77 | HR 88 | Temp 98.5°F | Resp 17 | Wt 171.0 lb

## 2016-07-29 DIAGNOSIS — Z7901 Long term (current) use of anticoagulants: Secondary | ICD-10-CM

## 2016-07-29 DIAGNOSIS — Z86718 Personal history of other venous thrombosis and embolism: Secondary | ICD-10-CM | POA: Diagnosis not present

## 2016-07-29 DIAGNOSIS — D75839 Thrombocytosis, unspecified: Secondary | ICD-10-CM

## 2016-07-29 DIAGNOSIS — D473 Essential (hemorrhagic) thrombocythemia: Secondary | ICD-10-CM | POA: Diagnosis not present

## 2016-07-29 DIAGNOSIS — D6862 Lupus anticoagulant syndrome: Secondary | ICD-10-CM | POA: Diagnosis not present

## 2016-07-29 LAB — CMP (CANCER CENTER ONLY)
ALT(SGPT): 85 U/L — ABNORMAL HIGH (ref 10–47)
AST: 105 U/L — AB (ref 11–38)
Albumin: 3.5 g/dL (ref 3.3–5.5)
Alkaline Phosphatase: 63 U/L (ref 26–84)
BUN, Bld: 4 mg/dL — ABNORMAL LOW (ref 7–22)
CALCIUM: 9.5 mg/dL (ref 8.0–10.3)
CO2: 26 meq/L (ref 18–33)
Chloride: 100 mEq/L (ref 98–108)
Creat: 0.8 mg/dl (ref 0.6–1.2)
GLUCOSE: 147 mg/dL — AB (ref 73–118)
POTASSIUM: 3.4 meq/L (ref 3.3–4.7)
Sodium: 138 mEq/L (ref 128–145)
Total Bilirubin: 0.7 mg/dl (ref 0.20–1.60)
Total Protein: 7.8 g/dL (ref 6.4–8.1)

## 2016-07-29 LAB — CBC WITH DIFFERENTIAL (CANCER CENTER ONLY)
BASO#: 0 10*3/uL (ref 0.0–0.2)
BASO%: 0.4 % (ref 0.0–2.0)
EOS%: 4 % (ref 0.0–7.0)
Eosinophils Absolute: 0.3 10*3/uL (ref 0.0–0.5)
HEMATOCRIT: 39.5 % (ref 34.8–46.6)
HGB: 14.2 g/dL (ref 11.6–15.9)
LYMPH#: 1.8 10*3/uL (ref 0.9–3.3)
LYMPH%: 23.9 % (ref 14.0–48.0)
MCH: 36.4 pg — ABNORMAL HIGH (ref 26.0–34.0)
MCHC: 35.9 g/dL (ref 32.0–36.0)
MCV: 101 fL (ref 81–101)
MONO#: 0.5 10*3/uL (ref 0.1–0.9)
MONO%: 6.7 % (ref 0.0–13.0)
NEUT#: 4.8 10*3/uL (ref 1.5–6.5)
NEUT%: 65 % (ref 39.6–80.0)
RBC: 3.9 10*6/uL (ref 3.70–5.32)
RDW: 14.9 % (ref 11.1–15.7)
WBC: 7.3 10*3/uL (ref 3.9–10.0)

## 2016-07-29 LAB — IRON AND TIBC
%SAT: 26 % (ref 21–57)
IRON: 87 ug/dL (ref 41–142)
TIBC: 330 ug/dL (ref 236–444)
UIBC: 243 ug/dL (ref 120–384)

## 2016-07-29 LAB — FERRITIN: Ferritin: 242 ng/ml (ref 9–269)

## 2016-07-29 LAB — CHCC SATELLITE - SMEAR

## 2016-07-29 NOTE — Progress Notes (Signed)
Hematology and Oncology Follow Up Visit  Terri Morris [id number] Nov 19, 1975 41 y.o. 07/29/2016   Principle Diagnosis:   thrombus of the LEFT basilic vein - (+) lupus anticoagulant Thrombocytosis-reactive  Current Therapy:    Xarelto 20 mg po q day - complete 6 months in 12/2015  EC ASA 162 mg po Q day     Interim History:  Ms. Terri Morris is back for follow-up. Shockley enough, she was just hospitalized. She apparently had congestive heart failure. She had hepatic and renal failure. She had pancreatitis. This sounds like this may have been alcohol related. Hopefully, alcohol will not be an issue in the future.  Her platelet count has been on the high side now. 11 days ago, her platelet count was 775,000.  She was hospitalized for 8 days. She had echocardiogram done. The echocardiogram showed a good ejection fraction of 60-65%.  She is not working right now. Hopefully she will be able to go back to work soon.  She's had no issues with respect to blood clot. She is taking baby aspirin. She is doing well with baby aspirin.  Overall, her performance status is ECOG 0.   Medications:  Current Outpatient Prescriptions:  .  amLODipine (NORVASC) 5 MG tablet, TAKE 1 TABLET(5 MG) BY MOUTH DAILY, Disp: 90 tablet, Rfl: 0 .  aspirin EC 81 MG tablet, Take 162 mg by mouth daily., Disp: , Rfl:  .  ferrous sulfate 325 (65 FE) MG EC tablet, Take 1 tablet (325 mg total) by mouth 3 (three) times daily with meals., Disp: 90 tablet, Rfl: 2 .  folic acid (FOLVITE) 1 MG tablet, Take 1 tablet (1 mg total) by mouth daily., Disp: 30 tablet, Rfl: 0 .  furosemide (LASIX) 20 MG tablet, Take 1 tablet (20 mg total) by mouth as needed (for weight greater than 178 lbs)., Disp: 10 tablet, Rfl: 3 .  lactulose (CHRONULAC) 10 GM/15ML solution, Take 45 mLs (30 g total) by mouth 2 (two) times daily., Disp: 240 mL, Rfl: 0 .  lisinopril (PRINIVIL,ZESTRIL) 20 MG tablet, TAKE 1 TABLET(20 MG) BY MOUTH DAILY (Patient  taking differently: Take 20 mg by mouth daily. ), Disp: 90 tablet, Rfl: 1 .  metFORMIN (GLUCOPHAGE) 500 MG tablet, Take 1 tablet (500 mg total) by mouth 2 (two) times daily with a meal., Disp: 60 tablet, Rfl: 0 .  nicotine (NICODERM CQ - DOSED IN MG/24 HOURS) 21 mg/24hr patch, Place 1 patch (21 mg total) onto the skin daily., Disp: 28 patch, Rfl: 0 .  pantoprazole (PROTONIX) 40 MG tablet, Take 1 tablet (40 mg total) by mouth daily at 12 noon., Disp: 30 tablet, Rfl: 0 .  thiamine 100 MG tablet, Take 1 tablet (100 mg total) by mouth daily., Disp: 30 tablet, Rfl: 0 .  traMADol (ULTRAM) 50 MG tablet, Take 1-2 tablets (50-100 mg total) by mouth every 6 (six) hours as needed (pain)., Disp: 24 tablet, Rfl: 0  Allergies:  Allergies  Allergen Reactions  . Ferrous Sulfate Other (See Comments)    Severe stomach pains and constipation    Past Medical History, Surgical history, Social history, and Family History were reviewed and updated.  Review of Systems: As above  Physical Exam:  weight is 171 lb (77.6 kg). Her oral temperature is 98.5 F (36.9 C). Her blood pressure is 112/77 and her pulse is 88. Her respiration is 17 and oxygen saturation is 100%.   Wt Readings from Last 3 Encounters:  07/29/16 171 lb (77.6 kg)  07/26/16 169  lb 3.2 oz (76.7 kg)  07/25/16 170 lb 12.8 oz (77.5 kg)     Well-developed and well-nourished [place] American female in no obvious distress. Head and neck exam shows no ocular or oral lesions. There are no palpable cervical or supraclavicular lymph nodes. Lungs are clear bilaterally. Cardiac exam regular rate and rhythm with no murmurs, rubs or bruits. Abdomen is soft. She has good bowel sounds. There is no fluid wave. There is no palpable liver or spleen tip. Externally shows no clubbing, cyanosis or edema. There is no swelling in the left arm. No obvious venous cord is noted in the left arm. Neurological exam shows no focal neurological deficits.  Lab Results    Component Value Date   WBC 7.3 07/29/2016   HGB 14.2 07/29/2016   HCT 39.5 07/29/2016   MCV 101 07/29/2016   PLT 478 Platelet count consistent in citrate (H) 07/29/2016     Chemistry      Component Value Date/Time   NA 138 07/29/2016 0837   K 3.4 07/29/2016 0837   CL 100 07/29/2016 0837   CO2 26 07/29/2016 0837   BUN 4 (L) 07/29/2016 0837   CREATININE 0.8 07/29/2016 0837      Component Value Date/Time   CALCIUM 9.5 07/29/2016 0837   ALKPHOS 63 07/29/2016 0837   AST 105 (H) 07/29/2016 0837   ALT 85 (H) 07/29/2016 0837   BILITOT 0.70 07/29/2016 0837         Impression and Plan: Ms. Terri Morris is  a 65 -year-old [place] American female.  I think the thrombocytosis is reactive. She just had pancreatitis. She had hepatic issues. I really don't think that this is an indicator for any type of myeloproliferative problem.  Her platelet count does seem to be getting a little bit better.  She does not need a bone marrow test.  We are checking her iron studies. I would think that her iron studies should be okay given that her MCV is normal.  I will like to get her back in 3 months. We'll see what her platelet count is at that point.  She'll stay on the 2 baby aspirin daily. I don't think that there will be any issues with thromboembolic disease.   Volanda Napoleon, MD 7/20/20189:37 AM

## 2016-07-29 NOTE — Telephone Encounter (Signed)
Pt Notified. Pt states she is not having any itching and GI appointment is Aug 31st.

## 2016-08-01 NOTE — Telephone Encounter (Signed)
Paperwork very complicated and detailed; continues to be in progress, will be complete tomorrow/SLS 07/23

## 2016-08-02 NOTE — Telephone Encounter (Signed)
Completed as much as possible with [3] attached answer sheets on [facility] sheet; [place] [employer]/disability form portions also forwarded to provider for completion along with office notes/SLS 07/24

## 2016-08-06 ENCOUNTER — Telehealth: Payer: Self-pay | Admitting: Medical

## 2016-08-06 MED ORDER — ONDANSETRON 8 MG PO TBDP: 8.0000 mg | ORAL_TABLET | Freq: Three times a day (TID) | ORAL | 0 refills | Status: DC | PRN

## 2016-08-06 NOTE — Telephone Encounter (Signed)
rx zofran sent to pt pharmacy. 

## 2016-08-08 ENCOUNTER — Ambulatory Visit (HOSPITAL_BASED_OUTPATIENT_CLINIC_OR_DEPARTMENT_OTHER)
Admission: RE | Admit: 2016-08-08 | Discharge: 2016-08-08 | Disposition: A | Discharge status: Home / Self Care | Payer: PRIVATE HEALTH INSURANCE | Source: Ambulatory Visit | Attending: Medical | Admitting: Medical

## 2016-08-08 ENCOUNTER — Ambulatory Visit (INDEPENDENT_AMBULATORY_CARE_PROVIDER_SITE_OTHER): Payer: PRIVATE HEALTH INSURANCE | Admitting: Medical

## 2016-08-08 ENCOUNTER — Encounter: Payer: Self-pay | Admitting: Medical

## 2016-08-08 ENCOUNTER — Telehealth: Payer: Self-pay | Admitting: Medical

## 2016-08-08 VITALS — BP 123/78 | HR 99 | Temp 98.9°F | Resp 16 | Ht 70.0 in | Wt 168.2 lb

## 2016-08-08 DIAGNOSIS — R0600 Dyspnea, unspecified: Secondary | ICD-10-CM | POA: Diagnosis not present

## 2016-08-08 DIAGNOSIS — R5383 Other fatigue: Secondary | ICD-10-CM

## 2016-08-08 DIAGNOSIS — I5032 Chronic diastolic (congestive) heart failure: Secondary | ICD-10-CM | POA: Insufficient documentation

## 2016-08-08 DIAGNOSIS — R112 Nausea with vomiting, unspecified: Secondary | ICD-10-CM | POA: Diagnosis not present

## 2016-08-08 DIAGNOSIS — Z8719 Personal history of other diseases of the digestive system: Secondary | ICD-10-CM | POA: Diagnosis not present

## 2016-08-08 DIAGNOSIS — R0602 Shortness of breath: Secondary | ICD-10-CM | POA: Insufficient documentation

## 2016-08-08 DIAGNOSIS — R748 Abnormal levels of other serum enzymes: Secondary | ICD-10-CM | POA: Diagnosis not present

## 2016-08-08 DIAGNOSIS — R109 Unspecified abdominal pain: Secondary | ICD-10-CM

## 2016-08-08 LAB — POC URINALSYSI DIPSTICK (AUTOMATED)
BILIRUBIN UA: NEGATIVE
Blood, UA: NEGATIVE
GLUCOSE UA: NEGATIVE
KETONES UA: NEGATIVE
LEUKOCYTES UA: NEGATIVE
NITRITE UA: NEGATIVE
PH UA: 6 (ref 5.0–8.0)
Protein, UA: NEGATIVE
Spec Grav, UA: 1.015 (ref 1.010–1.025)
Urobilinogen, UA: 0.2 E.U./dL

## 2016-08-08 LAB — AMMONIA: AMMONIA: 47 umol/L — AB (ref 11–35)

## 2016-08-08 MED ORDER — HYDROXYZINE HCL 25 MG PO TABS: ORAL_TABLET | ORAL | 0 refills | Status: DC

## 2016-08-08 MED ORDER — LACTULOSE 10 GM/15ML PO SOLN
20.0000 g | Freq: Every day | ORAL | 1 refills | Status: DC
Start: 2016-08-08 — End: 2016-08-11

## 2016-08-08 NOTE — Addendum Note (Signed)
Encounter addended by: Darrick Grinder D, NP on: 08/08/2016  1:36 PM<BR>    Actions taken: LOS modified, Follow-up modified

## 2016-08-08 NOTE — Telephone Encounter (Signed)
rx lactulose refilled.

## 2016-08-08 NOTE — Patient Instructions (Addendum)
You are some better regarding abdomen pain and vomiting over the weekend. Improved after picking up zofran use. Need to repeat labs cbc, cmp, amylase, lipase, ammonia level and get urine poct.  Will notify GI that recently your abdomen discomfort and nausea with vomiting flared up and this was with no alcohol use.  For recent insomnia and failed melotin use will rx hydroxyzine. I think this will help but if not then will try trazadone.  I filled out your disability paper work and your fmla forms.  Will get cxr today. Make sure dypsnea episodes not chf flare.  Follow up in 2 weeks or as needed

## 2016-08-08 NOTE — Progress Notes (Signed)
Subjective:    Patient ID: Terri Morris, female    DOB: 1975-05-04, 41 y.o.   MRN: [id number]  HPI  Pt in for evaluation.  Pt states she got some nausea and some vomiting on Thursday am. Mild-moderate all day. Then on Thursday night tried some ginger ale  and was nausea but able to tolerate keep ginger ale down. Friday felt nauseated all day. Saturday she got zofran which I called in and she was able to eat and drink  liquids and eat all foods. Pt upcoming appointment with GI  is on 09-09-2016.  Pt continue to not drink any alcohol. Pt states hospital MD made her aware of danger/life threatening potential problems.   Pt stomach is still hurting since I saw her but mild intermittent. Pt bloated sensation is decreased. Pt has been fatigued since dc from hospital. Her energy slight improved. But even standing for short period of times causes fatigue(enough that she thinks would not be able to work presently.. Our staff had filled out short term disability form. Pt was admitted on June 30 th. She followed up with me on July 18, 2016. Pt recently tried to due some simple daily activity duties and states felt too week. Example  picking up 24 pack of water causes extreme fatigue.    Review of Systems  Constitutional: Negative for chills, fatigue and fever.  Eyes: Negative for redness.  Respiratory: Negative for chest tightness, shortness of breath and stridor.        Intermittent mild sob.  Cardiovascular: Negative for chest pain and palpitations.  Gastrointestinal: Positive for abdominal pain, nausea and vomiting. Negative for abdominal distention, constipation and diarrhea.  Endocrine: Negative for polydipsia and polyuria.  Genitourinary: Negative for difficulty urinating, dysuria, flank pain and frequency.  Musculoskeletal: Negative for back pain.  Skin: Negative for rash.  Neurological: Negative for dizziness, facial asymmetry, speech difficulty, weakness, light-headedness and  numbness.  Hematological: Negative for adenopathy. Does not bruise/bleed easily.  Psychiatric/Behavioral: Negative for behavioral problems, confusion, dysphoric mood and sleep disturbance. The patient is not nervous/anxious.        Denies depression but on screening attirubuting a lot of symptoms related to general health since hospitalization.    Past Medical History:  Diagnosis Date  . AKI (acute kidney injury) ([place]) 07/09/2016  . Anxiety   . HLD (hyperlipidemia) 07/09/2016  . Hypertension   . Thyroid disease   . Vaginal Pap smear, abnormal      Social History   Social History  . Marital status: Married    Spouse name: N/A  . Number of children: N/A  . Years of education: N/A   Occupational History  . Not on file.   Social History Main Topics  . Smoking status: Heavy Tobacco Smoker    Packs/day: 0.50    Years: 24.00    Types: Cigarettes  . Smokeless tobacco: Never Used  . Alcohol use 1.8 oz/week    3 Cans of beer per week     Comment: 3 glasses of wine on weekends  . Drug use: No  . Sexual activity: Yes    Birth control/ protection: None   Other Topics Concern  . Not on file   Social History Narrative  . No narrative on file    Past Surgical History:  Procedure Laterality Date  . ECTOPIC PREGNANCY SURGERY    . LEEP      Family History  Problem Relation Age of Onset  . Hypertension Mother   .  Stroke Mother   . Arthritis Mother   . Alcohol abuse Mother   . Diabetes Mother   . Hypertension Father   . Alcohol abuse Father   . Diabetes Father     Allergies  Allergen Reactions  . Ferrous Sulfate Other (See Comments)    Severe stomach pains and constipation    Current Outpatient Prescriptions on File Prior to Visit  Medication Sig Dispense Refill  . amLODipine (NORVASC) 5 MG tablet TAKE 1 TABLET(5 MG) BY MOUTH DAILY 90 tablet 0  . aspirin EC 81 MG tablet Take 162 mg by mouth daily.    . ferrous sulfate 325 (65 FE) MG EC tablet Take 1 tablet (325 mg  total) by mouth 3 (three) times daily with meals. 90 tablet 2  . folic acid (FOLVITE) 1 MG tablet Take 1 tablet (1 mg total) by mouth daily. 30 tablet 0  . furosemide (LASIX) 20 MG tablet Take 1 tablet (20 mg total) by mouth as needed (for weight greater than 178 lbs). 10 tablet 3  . lactulose (CHRONULAC) 10 GM/15ML solution Take 45 mLs (30 g total) by mouth 2 (two) times daily. 240 mL 0  . lisinopril (PRINIVIL,ZESTRIL) 20 MG tablet TAKE 1 TABLET(20 MG) BY MOUTH DAILY (Patient taking differently: Take 20 mg by mouth daily. ) 90 tablet 1  . metFORMIN (GLUCOPHAGE) 500 MG tablet Take 1 tablet (500 mg total) by mouth 2 (two) times daily with a meal. 60 tablet 0  . nicotine (NICODERM CQ - DOSED IN MG/24 HOURS) 21 mg/24hr patch Place 1 patch (21 mg total) onto the skin daily. 28 patch 0  . ondansetron (ZOFRAN ODT) 8 MG disintegrating tablet Take 1 tablet (8 mg total) by mouth every 8 (eight) hours as needed for nausea or vomiting. 20 tablet 0  . pantoprazole (PROTONIX) 40 MG tablet Take 1 tablet (40 mg total) by mouth daily at 12 noon. 30 tablet 0  . thiamine 100 MG tablet Take 1 tablet (100 mg total) by mouth daily. 30 tablet 0  . traMADol (ULTRAM) 50 MG tablet Take 1-2 tablets (50-100 mg total) by mouth every 6 (six) hours as needed (pain). 24 tablet 0   No current facility-administered medications on file prior to visit.     BP 123/78   Pulse 99   Temp 98.9 F (37.2 C) (Oral)   Resp 16   Ht 5\' 10"  (1.778 m)   Wt 168 lb 3.2 oz (76.3 kg)   SpO2 100%   BMI 24.13 kg/m       Objective:   Physical Exam  General Mental Status- Alert. General Appearance- Not in acute distress.   Skin General: Color- Normal Color. Moisture- Normal Moisture.  Neck Carotid Arteries- Normal color. Moisture- Normal Moisture. No carotid bruits. No JVD.  Chest and Lung Exam Auscultation: Breath Sounds:-Normal.  Cardiovascular Auscultation:Rythm- Regular. Murmurs & Other Heart Sounds:Auscultation of the  heart reveals- No Murmurs.  Abdomen Inspection:-Inspeection Normal. Palpation/Percussion:Note:No mass. Palpation and Percussion of the abdomen reveal- Non Tender, Non Distended + BS, no rebound or guarding.  Neurologic Cranial Nerve exam:- CN III-XII intact(No nystagmus), symmetric smile. Strength:- 5/5 equal and symmetric strength both upper and lower extremities.  Lower ext- no pedal edema. Negative homans signs.      Assessment & Plan:  You are some better regarding abdomen pain and vomiting over the weekend. Improved after picking up zofran use. Need to repeat labs cbc, cmp, amylase, lipase, ammonia level and get urine poct.  Will notify  GI that recently your abdomen discomfort and nausea with vomiting flared up and this was with no alcohol use.  For recent insomnia and failed melotin use will rx hydroxyzine. I think this will help but if not then will try trazadone.  I filled out your disability paper work and your fmla forms.  Will get cxr today. Make sure dypsnea episodes not chf flare.  Follow up in 2 weeks or as needed   Icarus Partch, Percell Miller, [employer]

## 2016-08-09 ENCOUNTER — Telehealth: Payer: Self-pay | Admitting: Medical

## 2016-08-09 LAB — CBC WITH DIFFERENTIAL/PLATELET
BASOS ABS: 0.2 10*3/uL — AB (ref 0.0–0.1)
Basophils Relative: 2.1 % (ref 0.0–3.0)
EOS PCT: 2.8 % (ref 0.0–5.0)
Eosinophils Absolute: 0.3 10*3/uL (ref 0.0–0.7)
HEMATOCRIT: 41.1 % (ref 36.0–46.0)
HEMOGLOBIN: 13.8 g/dL (ref 12.0–15.0)
LYMPHS ABS: 2.4 10*3/uL (ref 0.7–4.0)
LYMPHS PCT: 25.2 % (ref 12.0–46.0)
MCHC: 33.6 g/dL (ref 30.0–36.0)
MCV: 105.9 fl — ABNORMAL HIGH (ref 78.0–100.0)
MONOS PCT: 4.8 % (ref 3.0–12.0)
Monocytes Absolute: 0.5 10*3/uL (ref 0.1–1.0)
NRBC: 2 /100{WBCs} (ref 0–4)
Neutro Abs: 6.3 10*3/uL (ref 1.4–7.7)
Neutrophils Relative %: 65.1 % (ref 43.0–77.0)
PLATELETS: 390 10*3/uL (ref 150.0–400.0)
RBC: 3.89 Mil/uL (ref 3.87–5.11)
RDW: 16.8 % — ABNORMAL HIGH (ref 11.5–15.5)
WBC: 9.6 10*3/uL (ref 4.0–10.5)

## 2016-08-09 LAB — COMPREHENSIVE METABOLIC PANEL
ALK PHOS: 59 U/L (ref 39–117)
ALT: 32 U/L (ref 0–35)
AST: 41 U/L — AB (ref 0–37)
Albumin: 4.1 g/dL (ref 3.5–5.2)
BILIRUBIN TOTAL: 0.5 mg/dL (ref 0.2–1.2)
BUN: 4 mg/dL — ABNORMAL LOW (ref 6–23)
CALCIUM: 9.5 mg/dL (ref 8.4–10.5)
CO2: 28 meq/L (ref 19–32)
CREATININE: 0.65 mg/dL (ref 0.40–1.20)
Chloride: 102 mEq/L (ref 96–112)
GFR: 127.92 mL/min (ref >60.00)
Glucose, Bld: 105 mg/dL — ABNORMAL HIGH (ref 70–99)
Potassium: 3.6 mEq/L (ref 3.5–5.1)
Sodium: 139 mEq/L (ref 135–145)
TOTAL PROTEIN: 7.4 g/dL (ref 6.0–8.3)

## 2016-08-09 LAB — GAMMA GT: GGT: 75 U/L — ABNORMAL HIGH (ref 7–51)

## 2016-08-09 LAB — AMYLASE: Amylase: 15 U/L — ABNORMAL LOW (ref 27–131)

## 2016-08-09 LAB — LIPASE: LIPASE: 70 U/L — AB (ref 11.0–59.0)

## 2016-08-09 NOTE — Telephone Encounter (Signed)
Faxed to sender at [facility] with confirmation received; copy for scan/SLS 07/30

## 2016-08-09 NOTE — Telephone Encounter (Signed)
Pt had recurrent episode of nausea,  Vomiting and abdomen pain for 2 days and then symptoms subsided after zofran. She wants to know if GI can see her before current scheduled appointment.  Will you call and try.  Pt recent repeat labs look a lot better.  But if they have cancellation that would be great.

## 2016-08-10 NOTE — Telephone Encounter (Signed)
Appointment scheduled for 08/11/16 @ 3pm with Nicoletta Ba, PA.

## 2016-08-10 NOTE — Telephone Encounter (Signed)
Sheri,   Dr. Fuller Plan is Doc of the Day. Please advise as on scheduling. Thanks

## 2016-08-10 NOTE — Telephone Encounter (Signed)
If patient is willing she can see APP at any opening

## 2016-08-10 NOTE — Telephone Encounter (Signed)
Msg sent to [facility]

## 2016-08-11 ENCOUNTER — Ambulatory Visit (INDEPENDENT_AMBULATORY_CARE_PROVIDER_SITE_OTHER): Payer: PRIVATE HEALTH INSURANCE | Admitting: Physician Assistant

## 2016-08-11 ENCOUNTER — Encounter: Payer: Self-pay | Admitting: Physician Assistant

## 2016-08-11 VITALS — BP 124/80 | HR 99 | Ht 70.0 in | Wt 163.0 lb

## 2016-08-11 DIAGNOSIS — K7290 Hepatic failure, unspecified without coma: Secondary | ICD-10-CM

## 2016-08-11 DIAGNOSIS — K7030 Alcoholic cirrhosis of liver without ascites: Secondary | ICD-10-CM

## 2016-08-11 DIAGNOSIS — K852 Alcohol induced acute pancreatitis without necrosis or infection: Secondary | ICD-10-CM | POA: Diagnosis not present

## 2016-08-11 DIAGNOSIS — K7010 Alcoholic hepatitis without ascites: Secondary | ICD-10-CM

## 2016-08-11 DIAGNOSIS — K760 Fatty (change of) liver, not elsewhere classified: Secondary | ICD-10-CM

## 2016-08-11 NOTE — Progress Notes (Addendum)
Subjective:    Patient ID: Terri Morris, female    DOB: Oct 25, 1975, 41 y.o.   MRN: [id number]  HPI Joelene Millin is a pleasant 41 year old African-American female, new to GI today referred by Mackie Pai   PA-C for evaluation of elevated LFTs and for follow-up with recent hospitalization for alcoholic hepatitis and pancreatitis. Patient has not had any prior GI evaluation and was not seen by GI during her hospitalization. She does have history of adult-onset diabetes mellitus, anxiety, hypertension. She tells me she had been drinking on a daily basis at least 3-4 beers per day over the past 5 years and says she started after her son died. She was hospitalized 07/19/22- 07/17/2016 with acute EtOH-induced pancreatitis, alcoholic hepatitis and a toxic metabolic encephalopathy. She was also seen by cardiology during that admission and diagnosed with acute congestive heart failure, echo was done showing an EF of 65-70%. Upper abdominal ultrasound done on admission showed no gallstones or gallbladder wall thickening CBD 2 mm, liver with coarse heterogeneous echotexture consistent with fatty infiltration. Noncontrasted CT scan on 07/18/16 showed diffuse fatty infiltration of the liver, and a nonspecific low attenuation focus in the posterior right lobe of the liver measuring 13 x 10 mm pancreas enlarged and poorly defined. Pancreatic [place] compatible with acute pancreatitis, thickening of both adrenal glands question mass left adrenal gland  MRI of the abdomen and MRCP on 07/10/2016 showed severe hepatic steatosis heterogeneous perfusion of the posterior right hepatic lobe favored to reflect phase artifact no suspicious enhancing hepatic lesions the focal hypoenhancing lesion on CT not seen on MRI no definite cirrhosis, 1.8 cm left adrenal nodule compatible with benign adrenal adenoma.  Acute hepatitis serologies were negative. Most recent pro time 13.7/INR 0.5, LFTs while hospitalized with an AST of  318 and ALT 227. Most recent LFTs on 08/08/2016 completely normal with exception of AST of 41.  Patient states that she feels much better. She admits to still having some occasional upper abdominal discomfort. Appetite has been fair she's not had any nausea or vomiting. No fever she has been continued on lactulose because of the mildly elevated ammonia level and says that she has a lot of gurgling and usually having 3-4 bowel movements per day. She denies any heartburn or indigestion. She says she has not had any alcohol at all since admission to the hospital and understands that she cannot ever drink again.    Review of Systems  Pertinent positive and negative review of systems were noted in the above HPI section.  All other review of systems was otherwise negative.  Outpatient Encounter Prescriptions as of 08/11/2016  Medication Sig  . aspirin EC 81 MG tablet Take 162 mg by mouth daily.  . ferrous sulfate 325 (65 FE) MG EC tablet Take 1 tablet (325 mg total) by mouth 3 (three) times daily with meals.  . folic acid (FOLVITE) 1 MG tablet Take 1 tablet (1 mg total) by mouth daily.  . furosemide (LASIX) 20 MG tablet Take 1 tablet (20 mg total) by mouth as needed (for weight greater than 178 lbs).  . hydrOXYzine (ATARAX/VISTARIL) 25 MG tablet 1 tab po q hs as needed insomnia  . lisinopril (PRINIVIL,ZESTRIL) 20 MG tablet TAKE 1 TABLET(20 MG) BY MOUTH DAILY (Patient taking differently: Take 20 mg by mouth daily. )  . metFORMIN (GLUCOPHAGE) 500 MG tablet Take 1 tablet (500 mg total) by mouth 2 (two) times daily with a meal.  . nicotine (NICODERM CQ - DOSED IN MG/24  HOURS) 21 mg/24hr patch Place 1 patch (21 mg total) onto the skin daily.  . ondansetron (ZOFRAN ODT) 8 MG disintegrating tablet Take 1 tablet (8 mg total) by mouth every 8 (eight) hours as needed for nausea or vomiting.  . thiamine 100 MG tablet Take 1 tablet (100 mg total) by mouth daily.  . traMADol (ULTRAM) 50 MG tablet Take 1-2 tablets  (50-100 mg total) by mouth every 6 (six) hours as needed (pain).  . [DISCONTINUED] lactulose (CHRONULAC) 10 GM/15ML solution Take 30 mLs (20 g total) by mouth daily.  . [DISCONTINUED] pantoprazole (PROTONIX) 40 MG tablet Take 1 tablet (40 mg total) by mouth daily at 12 noon.  . [DISCONTINUED] amLODipine (NORVASC) 5 MG tablet TAKE 1 TABLET(5 MG) BY MOUTH DAILY (Patient not taking: Reported on 08/11/2016)   No facility-administered encounter medications on file as of 08/11/2016.    Allergies  Allergen Reactions  . Ferrous Sulfate Other (See Comments)    Severe stomach pains and constipation   Patient Active Problem List   Diagnosis Date Noted  . Abnormal liver function   . Acute diastolic CHF (congestive heart failure) ([place])   . Acute pancreatitis 07/09/2016  . DKA (diabetic ketoacidoses) ([place]) 07/09/2016  . AKI (acute kidney injury) ([place]) 07/09/2016  . Hyperkalemia 07/09/2016  . Alcohol use 07/09/2016  . Acute respiratory failure with hypoxia ([place]) 07/09/2016  . Liver lesion 07/09/2016  . HLD (hyperlipidemia) 07/09/2016  . Metabolic acidosis   . Wellness examination 02/02/2015  . Hypertension 01/19/2015  . Smoking 01/19/2015  . Anxiety state 01/19/2015  . Elevated TSH 01/19/2015   Social History   Social History  . Marital status: Married    Spouse name: N/A  . Number of children: N/A  . Years of education: N/A   Occupational History  . Not on file.   Social History Main Topics  . Smoking status: Heavy Tobacco Smoker    Packs/day: 0.50    Years: 24.00    Types: Cigarettes  . Smokeless tobacco: Never Used  . Alcohol use 1.8 oz/week    3 Cans of beer per week     Comment: 3 glasses of wine on weekends  . Drug use: No  . Sexual activity: Yes    Birth control/ protection: None   Other Topics Concern  . Not on file   Social History Narrative  . No narrative on file    Ms. Hulme's family history includes Alcohol abuse in her father and mother; Arthritis in her  mother; Diabetes in her father and mother; Hypertension in her father and mother; Stroke in her mother.      Objective:    Vitals:   08/11/16 1456  BP: 124/80  Pulse: 99    Physical Exam  well-developed African-American female in no acute distress, very pleasant blood pressure 124/80 pulse 99 height 5 foot 10. HEENT; nontraumatic normocephalic EOMI PERRLA sclera anicteric, Cardiovascular; regular rate and rhythm with S1-S2 no murmur or gallop, Pulmonary ;clear bilaterally, Abdomen; soft, no focal tenderness no palpable mass or hepatosplenomegaly no guarding or rebound bowel sounds are present, Rectal ;exam not done, Extremities; no clubbing cyanosis or edema skin warm and dry, Neuropsych ;mood and affect appropriate no asterixis       Assessment & Plan:   #44  41 year old African-American female admitted 633 07/17/2016 with acute EtOH pancreatitis ,uncomplicated, and much improved #2 acute alcoholic hepatitis-much improved #3 alcoholism-patient has not had any alcohol since admission to the hospital #4 of adult-onset diabetes mellitus #  5 encephalopathy with mild elevation of venous ammonia-think this was secondary to acute illness and do not think she has evidence for cirrhosis or chronic hepatic encephalopathy #6 severe hepatic steatosis but no definite cirrhosis by MRI #7 focal hypoenhancing right hepatic lobe lesion noted on noncontrasted CT and not seen on MRI-radiologist suggest follow-up CT in 3 months  Plan; Patient was commended for EtOH abstinence, we discussed that it was imperative that she avoid alcohol all together forever. We also discussed benefit of AA for long-term success. No additional labs needed today She will finish her current prescription of Protonix and then stop. She'll finish current prescription of lactulose and stop Will plan office follow-up with Dr. Loletha Carrow  in 2-3 months. We'll repeat labs at that time and then also consider contrasted CT of the abdomen  ,attention liver.    Eudell Julian S Enda Santo PA-C 08/11/2016   Cc: Mackie Pai, PA-C   Thank you for sending this case to me. I have reviewed the entire note, and the outlined plan seems appropriate.   Wilfrid Lund, MD

## 2016-08-11 NOTE — Patient Instructions (Addendum)
Please discontinue Protonix after your current prescription. Please discontinue Lactulose at the end of the month.   As discussed, please consider involvement in AA.  Normal BMI (Body Mass Index- based on height and weight) is between 19 and 25. Your BMI today is Body mass index is 23.39 kg/m.

## 2016-08-19 ENCOUNTER — Ambulatory Visit: Payer: PRIVATE HEALTH INSURANCE | Admitting: Gastroenterology

## 2016-08-29 ENCOUNTER — Other Ambulatory Visit: Payer: Self-pay

## 2016-08-29 MED ORDER — FOLIC ACID 1 MG PO TABS: 1.0000 mg | ORAL_TABLET | Freq: Every day | ORAL | 0 refills | Status: DC

## 2016-09-02 ENCOUNTER — Ambulatory Visit (HOSPITAL_BASED_OUTPATIENT_CLINIC_OR_DEPARTMENT_OTHER)
Admission: RE | Admit: 2016-09-02 | Discharge: 2016-09-02 | Disposition: A | Discharge status: Home / Self Care | Payer: PRIVATE HEALTH INSURANCE | Source: Ambulatory Visit | Attending: Medical | Admitting: Medical

## 2016-09-02 ENCOUNTER — Encounter: Payer: Self-pay | Admitting: Medical

## 2016-09-02 ENCOUNTER — Ambulatory Visit (INDEPENDENT_AMBULATORY_CARE_PROVIDER_SITE_OTHER): Payer: PRIVATE HEALTH INSURANCE | Admitting: Medical

## 2016-09-02 VITALS — BP 145/85 | HR 91 | Temp 98.5°F | Resp 16 | Ht 70.0 in | Wt 173.8 lb

## 2016-09-02 DIAGNOSIS — R2242 Localized swelling, mass and lump, left lower limb: Secondary | ICD-10-CM | POA: Diagnosis not present

## 2016-09-02 DIAGNOSIS — M79672 Pain in left foot: Secondary | ICD-10-CM

## 2016-09-02 DIAGNOSIS — R739 Hyperglycemia, unspecified: Secondary | ICD-10-CM

## 2016-09-02 MED ORDER — METFORMIN HCL 500 MG PO TABS: 500.0000 mg | ORAL_TABLET | Freq: Two times a day (BID) | ORAL | 1 refills | Status: DC

## 2016-09-02 NOTE — Progress Notes (Signed)
Subjective:    Patient ID: Terri Morris, female    DOB: 15-Jul-1975, 41 y.o.   MRN: [id number]  HPI  Pt has small lump in her left bottom of foot/in arch. She states faint pain. She states rare low level pain if she steps really hard but otherwise walks during the day with minimal symptoms.  Noticed this just 3 days ago.  No trauma or injury.  Pt had high sugars in hospital      Review of Systems  Constitutional: Negative for chills, diaphoresis, fatigue and fever.  Respiratory: Negative for cough and chest tightness.   Cardiovascular: Negative for chest pain and palpitations.  Gastrointestinal: Negative for abdominal pain.  Musculoskeletal: Negative for arthralgias, gait problem and neck pain.       Please see foot exam and history of present illness  Skin: Negative for rash.  Neurological: Negative for dizziness and light-headedness.  Hematological: Negative for adenopathy. Does not bruise/bleed easily.  Psychiatric/Behavioral: Negative for behavioral problems and confusion.    Past Medical History:  Diagnosis Date  . AKI (acute kidney injury) ([place]) 07/09/2016  . Anxiety   . HLD (hyperlipidemia) 07/09/2016  . Hypertension   . Thyroid disease   . Vaginal Pap smear, abnormal      Social History   Social History  . Marital status: Married    Spouse name: N/A  . Number of children: N/A  . Years of education: N/A   Occupational History  . Not on file.   Social History Main Topics  . Smoking status: Heavy Tobacco Smoker    Packs/day: 0.50    Years: 24.00    Types: Cigarettes  . Smokeless tobacco: Never Used  . Alcohol use 1.8 oz/week    3 Cans of beer per week     Comment: 3 glasses of wine on weekends  . Drug use: No  . Sexual activity: Yes    Birth control/ protection: None   Other Topics Concern  . Not on file   Social History Narrative  . No narrative on file    Past Surgical History:  Procedure Laterality Date  . ECTOPIC PREGNANCY  SURGERY    . LEEP      Family History  Problem Relation Age of Onset  . Hypertension Mother   . Stroke Mother   . Arthritis Mother   . Alcohol abuse Mother   . Diabetes Mother   . Hypertension Father   . Alcohol abuse Father   . Diabetes Father     Allergies  Allergen Reactions  . Ferrous Sulfate Other (See Comments)    Severe stomach pains and constipation    Current Outpatient Prescriptions on File Prior to Visit  Medication Sig Dispense Refill  . aspirin EC 81 MG tablet Take 162 mg by mouth daily.    . ferrous sulfate 325 (65 FE) MG EC tablet Take 1 tablet (325 mg total) by mouth 3 (three) times daily with meals. 90 tablet 2  . folic acid (FOLVITE) 1 MG tablet Take 1 tablet (1 mg total) by mouth daily. 30 tablet 0  . furosemide (LASIX) 20 MG tablet Take 1 tablet (20 mg total) by mouth as needed (for weight greater than 178 lbs). 10 tablet 3  . hydrOXYzine (ATARAX/VISTARIL) 25 MG tablet 1 tab po q hs as needed insomnia 30 tablet 0  . lisinopril (PRINIVIL,ZESTRIL) 20 MG tablet TAKE 1 TABLET(20 MG) BY MOUTH DAILY (Patient taking differently: Take 20 mg by mouth daily. ) 90  tablet 1  . metFORMIN (GLUCOPHAGE) 500 MG tablet Take 1 tablet (500 mg total) by mouth 2 (two) times daily with a meal. 60 tablet 0  . nicotine (NICODERM CQ - DOSED IN MG/24 HOURS) 21 mg/24hr patch Place 1 patch (21 mg total) onto the skin daily. 28 patch 0  . ondansetron (ZOFRAN ODT) 8 MG disintegrating tablet Take 1 tablet (8 mg total) by mouth every 8 (eight) hours as needed for nausea or vomiting. 20 tablet 0  . thiamine 100 MG tablet Take 1 tablet (100 mg total) by mouth daily. 30 tablet 0  . traMADol (ULTRAM) 50 MG tablet Take 1-2 tablets (50-100 mg total) by mouth every 6 (six) hours as needed (pain). 24 tablet 0   No current facility-administered medications on file prior to visit.     BP (!) 143/98   Pulse 91   Temp 98.5 F (36.9 C) (Oral)   Resp 16   Ht 5\' 10"  (1.778 m)   Wt 173 lb 12.8 oz  (78.8 kg)   SpO2 100%   BMI 24.94 kg/m       Objective:   Physical Exam  General- No acute distress. Pleasant patient. Neck- Full range of motion, no jvd Lungs- Clear, even and unlabored. Heart- regular rate and rhythm. Neurologic- CNII- XII grossly intact.  Left- foot on arch has moderate cyst type mass.      Assessment & Plan:  For your left foot pain, I will get x-ray of your foot. But will also refer to podiatrist to evaluate what appears to feel like a cyst near the arch region.  You might benefit from Dr. Felicie Morn foot cushion/pad.  For history of hyperglycemia advise that you continue metformin until early October when we will check you're A1c. At that point we'll decide if you need to continue.   Follow-up 2 months or as needed  Galilee Pierron, Percell Miller, [place]

## 2016-09-02 NOTE — Patient Instructions (Addendum)
For your left foot pain, I will get x-ray of your foot. But will also refer to podiatrist to evaluate what appears to feel like a cyst near the arch region.  You might benefit from Dr. Felicie Morn foot cushion/pad.  For history of hyperglycemia advise that you continue metformin until early October when we will check you're A1c. At that point we'll decide if you need to continue.  Follow-up 2 months or as needed

## 2016-09-09 ENCOUNTER — Ambulatory Visit: Payer: PRIVATE HEALTH INSURANCE | Admitting: Internal Medicine

## 2016-09-13 ENCOUNTER — Ambulatory Visit: Payer: Self-pay | Admitting: Podiatry

## 2016-09-20 ENCOUNTER — Encounter: Payer: Self-pay | Admitting: Podiatry

## 2016-09-20 NOTE — Progress Notes (Signed)
No show for today's appointment. She has also cancelled her last 2 appointments.

## 2016-09-27 ENCOUNTER — Encounter: Payer: Self-pay | Admitting: Medical

## 2016-09-28 ENCOUNTER — Telehealth: Payer: Self-pay | Admitting: Medical

## 2016-09-28 NOTE — Telephone Encounter (Signed)
I just tried to send you/forward a return to work request for 10-03-2016. Letter should read can return  With  no restriction. The message I tried to send you had the fax number she wants [place] to fax it to. Was I successful in sending you the message. If not then let me know. If so will you go ahead and type out that letter and I will sign.

## 2016-09-29 NOTE — Telephone Encounter (Signed)
Fax sent to [facility] at [phone]

## 2016-10-05 ENCOUNTER — Ambulatory Visit: Payer: PRIVATE HEALTH INSURANCE | Admitting: Gastroenterology

## 2016-10-05 ENCOUNTER — Other Ambulatory Visit: Payer: Self-pay

## 2016-11-26 ENCOUNTER — Other Ambulatory Visit: Payer: Self-pay | Admitting: Medical

## 2016-12-06 ENCOUNTER — Telehealth: Payer: Self-pay

## 2016-12-06 ENCOUNTER — Telehealth: Payer: Self-pay | Admitting: Medical

## 2016-12-06 DIAGNOSIS — R739 Hyperglycemia, unspecified: Secondary | ICD-10-CM

## 2016-12-06 NOTE — Telephone Encounter (Signed)
Please advise 

## 2016-12-06 NOTE — Telephone Encounter (Signed)
Copied from [place]. Topic: Appointment Scheduling - Scheduling Inquiry for Clinic >> Dec 06, 2016  8:11 AM Synthia Innocent wrote: Reason for CRM: Requesting to come in and have A1C, do not see order

## 2016-12-06 NOTE — Telephone Encounter (Signed)
Spoke w/ Pt, lab appt scheduled for tomorrow morning.

## 2016-12-06 NOTE — Telephone Encounter (Signed)
I just placed order for A1c and designated as future.  You can advise patient orders then and she can get scheduled for that.

## 2016-12-07 ENCOUNTER — Telehealth: Payer: Self-pay | Admitting: Medical

## 2016-12-07 ENCOUNTER — Other Ambulatory Visit (INDEPENDENT_AMBULATORY_CARE_PROVIDER_SITE_OTHER): Payer: PRIVATE HEALTH INSURANCE

## 2016-12-07 DIAGNOSIS — R739 Hyperglycemia, unspecified: Secondary | ICD-10-CM

## 2016-12-07 LAB — HEMOGLOBIN A1C: Hgb A1c MFr Bld: 6.7 % — ABNORMAL HIGH (ref 4.6–6.5)

## 2016-12-07 NOTE — Telephone Encounter (Signed)
Open to review.  

## 2016-12-07 NOTE — Telephone Encounter (Signed)
Regarding the note about calling patient before you schedule MRI.  It looks like I put that in the wrong patients chart.  I will give you the correct chart

## 2016-12-07 NOTE — Telephone Encounter (Signed)
Erroneous opening

## 2016-12-07 NOTE — Telephone Encounter (Signed)
Will you call patient before you arrange mri of cervical spine. She may have preference where she gets imaging study done.

## 2016-12-08 NOTE — Telephone Encounter (Signed)
Pt MRN number?

## 2016-12-14 NOTE — Telephone Encounter (Signed)
A1c completed 12/07/2016.

## 2016-12-29 ENCOUNTER — Ambulatory Visit (INDEPENDENT_AMBULATORY_CARE_PROVIDER_SITE_OTHER): Payer: PRIVATE HEALTH INSURANCE | Admitting: Obstetrics & Gynecology

## 2016-12-29 ENCOUNTER — Encounter: Payer: Self-pay | Admitting: Obstetrics & Gynecology

## 2016-12-29 VITALS — BP 144/94 | HR 118 | Ht 71.0 in | Wt 170.0 lb

## 2016-12-29 DIAGNOSIS — Z0000 Encounter for general adult medical examination without abnormal findings: Secondary | ICD-10-CM

## 2016-12-29 DIAGNOSIS — Z124 Encounter for screening for malignant neoplasm of cervix: Secondary | ICD-10-CM | POA: Diagnosis not present

## 2016-12-29 DIAGNOSIS — F320 Major depressive disorder, single episode, mild: Secondary | ICD-10-CM

## 2016-12-29 DIAGNOSIS — Z113 Encounter for screening for infections with a predominantly sexual mode of transmission: Secondary | ICD-10-CM

## 2016-12-29 DIAGNOSIS — Z01419 Encounter for gynecological examination (general) (routine) without abnormal findings: Secondary | ICD-10-CM

## 2016-12-29 DIAGNOSIS — Z716 Tobacco abuse counseling: Secondary | ICD-10-CM

## 2016-12-29 MED ORDER — SERTRALINE HCL 50 MG PO TABS: 50.0000 mg | ORAL_TABLET | Freq: Every day | ORAL | 3 refills | Status: DC

## 2016-12-29 MED ORDER — SERTRALINE HCL 25 MG PO TABS: 25.0000 mg | ORAL_TABLET | Freq: Every day | ORAL | 0 refills | Status: DC

## 2016-12-29 NOTE — Patient Instructions (Signed)
Major Depressive Disorder, Adult Major depressive disorder (MDD) is a mental health condition. It may also be called clinical depression or unipolar depression. MDD usually causes feelings of sadness, hopelessness, or helplessness. MDD can also cause physical symptoms. It can interfere with work, school, relationships, and other everyday activities. MDD may be mild, moderate, or severe. It may occur once (single episode major depressive disorder) or it may occur multiple times (recurrent major depressive disorder). What are the causes? The exact cause of this condition is not known. MDD is most likely caused by a combination of things, which may include:  Genetic factors. These are traits that are passed along from parent to child.  Individual factors. Your personality, your behavior, and the way you handle your thoughts and feelings may contribute to MDD. This includes personality traits and behaviors learned from others.  Physical factors, such as: ? Differences in the part of your brain that controls emotion. This part of your brain may be different than it is in people who do not have MDD. ? Long-term (chronic) medical or psychiatric illnesses.  Social factors. Traumatic experiences or major life changes may play a role in the development of MDD.  What increases the risk? This condition is more likely to develop in women. The following factors may also make you more likely to develop MDD:  A family history of depression.  Troubled family relationships.  Abnormally low levels of certain brain chemicals.  Traumatic events in childhood, especially abuse or the loss of a parent.  Being under a lot of stress, or long-term stress, especially from upsetting life experiences or losses.  A history of: ? Chronic physical illness. ? Other mental health disorders. ? Substance abuse.  Poor living conditions.  Experiencing social exclusion or discrimination on a regular basis.  What are  the signs or symptoms? The main symptoms of MDD typically include:  Constant depressed or irritable mood.  Loss of interest in things and activities.  MDD symptoms may also include:  Sleeping or eating too much or too little.  Unexplained weight change.  Fatigue or low energy.  Feelings of worthlessness or guilt.  Difficulty thinking clearly or making decisions.  Thoughts of suicide or of harming others.  Physical agitation or weakness.  Isolation.  Severe cases of MDD may also occur with other symptoms, such as:  Delusions or hallucinations, in which you imagine things that are not real (psychotic depression).  Low-level depression that lasts at least a year (chronic depression or persistent depressive disorder).  Extreme sadness and hopelessness (melancholic depression).  Trouble speaking and moving (catatonic depression).  How is this diagnosed? This condition may be diagnosed based on:  Your symptoms.  Your medical history, including your mental health history. This may involve tests to evaluate your mental health. You may be asked questions about your lifestyle, including any drug and alcohol use, and how long you have had symptoms of MDD.  A physical exam.  Blood tests to rule out other conditions.  You must have a depressed mood and at least four other MDD symptoms most of the day, nearly every day in the same 2-week timeframe before your health care provider can confirm a diagnosis of MDD. How is this treated? This condition is usually treated by mental health professionals, such as psychologists, psychiatrists, and clinical social workers. You may need more than one type of treatment. Treatment may include:  Psychotherapy. This is also called talk therapy or counseling. Types of psychotherapy include: ? Cognitive behavioral   therapy (CBT). This type of therapy teaches you to recognize unhealthy feelings, thoughts, and behaviors, and replace them with  positive thoughts and actions. ? Interpersonal therapy (IPT). This helps you to improve the way you relate to and communicate with others. ? Family therapy. This treatment includes members of your family.  Medicine to treat anxiety and depression, or to help you control certain emotions and behaviors.  Lifestyle changes, such as: ? Limiting alcohol and drug use. ? Exercising regularly. ? Getting plenty of sleep. ? Making healthy eating choices. ? Spending more time outdoors.  Treatments involving stimulation of the brain can be used in situations with extremely severe symptoms, or when medicine or other therapies do not work over time. These treatments include electroconvulsive therapy, transcranial magnetic stimulation, and vagal nerve stimulation. Follow these instructions at home: Activity  Return to your normal activities as told by your health care provider.  Exercise regularly and spend time outdoors as told by your health care provider. General instructions  Take over-the-counter and prescription medicines only as told by your health care provider.  Do not drink alcohol. If you drink alcohol, limit your alcohol intake to no more than 1 drink a day for nonpregnant women and 2 drinks a day for men. One drink equals 12 oz of beer, 5 oz of wine, or 1 oz of hard liquor. Alcohol can affect any antidepressant medicines you are taking. Talk to your health care provider about your alcohol use.  Eat a healthy diet and get plenty of sleep.  Find activities that you enjoy doing, and make time to do them.  Consider joining a support group. Your health care provider may be able to recommend a support group.  Keep all follow-up visits as told by your health care provider. This is important. Where to find more information: National Alliance on Mental Illness  www.nami.org  U.S. National Institute of Mental Health  www.nimh.nih.gov  National Suicide Prevention  Lifeline  1-800-273-TALK (8255). This is free, 24-hour help.  Contact a health care provider if:  Your symptoms get worse.  You develop new symptoms. Get help right away if:  You self-harm.  You have serious thoughts about hurting yourself or others.  You see, hear, taste, smell, or feel things that are not present (hallucinate). This information is not intended to replace advice given to you by your health care provider. Make sure you discuss any questions you have with your health care provider. Document Released: 04/23/2012 Document Revised: 09/03/2015 Document Reviewed: 07/08/2015 Elsevier Interactive Patient Education  2018 [employer]  

## 2016-12-29 NOTE — Progress Notes (Signed)
Subjective:     Terri Morris is a 41 y.o. female here for a routine exam.  Current complaints: Pt was in the hosp for 8 days June 30th to July 8th with  CHF, pancreatitis; Diabetic ketoacidosis, ARF and other conditions. She admits that this was related to ETOH .  Pt is s/p LEEP November 21, 2015. Her son died (dates unknown possible 1 year prev). She admits to being depressed about this. The holidays make this worse. She has increased her tob use from 1 ppd to 1 1/2 ppd. She admits to being depressed. She has no suicidal ideations. She denies ETOH use since discharge since she is now 'scared to drink.'. Her husband does drink and there is ETOH in the home.      Gynecologic History Patient's last menstrual period was 12/12/2016. Last Pap: 06/24/2016. Results were: normal Last mammogram: 04/2016. Results were: normal  Obstetric History OB History  Gravida Para Term Preterm AB Living  4 1 1   3     SAB TAB Ectopic Multiple Live Births      3   1    # Outcome Date GA Lbr Len/2nd Weight Sex Delivery Anes PTL Lv  4 Term      Vag-Spont     3 Ectopic           2 Ectopic           1 Ectopic              The following portions of the patient's history were reviewed and updated as appropriate: allergies, current medications, past family history, past medical history, past social history, past surgical history and problem list.  Review of Systems Pertinent items are noted in HPI.    Objective:  BP (!) 144/94   Pulse (!) 118   Ht 5\' 11"  (1.803 m)   Wt 170 lb (77.1 kg)   LMP 12/12/2016   BMI 23.71 kg/m  General Appearance:    Alert, cooperative, no distress, appears stated age; pt teary   Head:    Normocephalic, without obvious abnormality, atraumatic  Eyes:    conjunctiva/corneas clear, EOM's intact, both eyes  Ears:    Normal external ear canals, both ears  Nose:   Nares normal, septum midline, mucosa normal, no drainage    or sinus tenderness  Throat:   Lips, mucosa, and tongue normal; teeth  and gums normal  Neck:   Supple, symmetrical, trachea midline, no adenopathy;    thyroid:  no enlargement/tenderness/nodules  Back:     Symmetric, no curvature, ROM normal, no CVA tenderness  Lungs:     Clear to auscultation bilaterally, respirations unlabored  Chest Wall:    No tenderness or deformity   Heart:    Regular rate and rhythm, S1 and S2 normal, no murmur, rub   or gallop  Breast Exam:    No tenderness, masses, or nipple abnormality  Abdomen:     Soft, non-tender, bowel sounds active all four quadrants,    no masses, no organomegaly  Genitalia:    Normal female without lesion, discharge or tenderness     Extremities:   Extremities normal, atraumatic, no cyanosis or edema  Pulses:   2+ and symmetric all extremities  Skin:   Skin color, texture, turgor normal, no rashes or lesions     Assessment:    Healthy female exam.   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       Plan:    Follow up in: 6 weeks.    Zoloft 25mg  daily for 7 days followed by 50mg  daily F/u in 6 weeks or sooner prn Rec removing all ETOH from the home Need to address program for ETOH abuse and Tob cessation at another visit when pts depression is improved. Note to pts primary care provider   [name] L. Harraway-Smith, M.D., Cherlynn June

## 2017-01-05 ENCOUNTER — Telehealth: Payer: Self-pay

## 2017-01-05 LAB — CYTOLOGY - PAP
CHLAMYDIA, DNA PROBE: NEGATIVE
Diagnosis: UNDETERMINED — AB
HPV (WINDOPATH): NOT DETECTED
NEISSERIA GONORRHEA: NEGATIVE

## 2017-01-05 NOTE — Telephone Encounter (Signed)
-----   Message from Lavonia Drafts, MD sent at 01/05/2017  2:59 PM EST ----- Please call pt. She needs to f/u in 1 year for a repeat PAP.   She still needs to f/u for depression and med eval.   Thx, clh-S

## 2017-01-05 NOTE — Telephone Encounter (Signed)
Patient called and made aware of pap smear results and that she will need a pap smear in one year.   Patient is planning on following up for med check at end of this month. Kathrene Alu RNBSN

## 2017-02-08 ENCOUNTER — Ambulatory Visit (INDEPENDENT_AMBULATORY_CARE_PROVIDER_SITE_OTHER): Payer: PRIVATE HEALTH INSURANCE | Admitting: Obstetrics & Gynecology

## 2017-02-08 ENCOUNTER — Encounter: Payer: Self-pay | Admitting: Obstetrics & Gynecology

## 2017-02-08 DIAGNOSIS — F320 Major depressive disorder, single episode, mild: Secondary | ICD-10-CM

## 2017-02-08 MED ORDER — SERTRALINE HCL 50 MG PO TABS: 50.0000 mg | ORAL_TABLET | Freq: Every day | ORAL | 3 refills | Status: DC

## 2017-02-08 NOTE — Patient Instructions (Signed)
Living With Depression Everyone experiences occasional disappointment, sadness, and loss in their lives. When you are feeling down, blue, or sad for at least 2 weeks in a row, it may mean that you have depression. Depression can affect your thoughts and feelings, relationships, daily activities, and physical health. It is caused by changes in the way your brain functions. If you receive a diagnosis of depression, your health care provider will tell you which type of depression you have and what treatment options are available to you. If you are living with depression, there are ways to help you recover from it and also ways to prevent it from coming back. How to cope with lifestyle changes Coping with stress Stress is your body's reaction to life changes and events, both good and bad. Stressful situations may include:  Getting married.  The death of a spouse.  Losing a job.  Retiring.  Having a baby.  Stress can last just a few hours or it can be ongoing. Stress can play a major role in depression, so it is important to learn both how to cope with stress and how to think about it differently. Talk with your health care provider or a counselor if you would like to learn more about stress reduction. He or she may suggest some stress reduction techniques, such as:  Music therapy. This can include creating music or listening to music. Choose music that you enjoy and that inspires you.  Mindfulness-based meditation. This kind of meditation can be done while sitting or walking. It involves being aware of your normal breaths, rather than trying to control your breathing.  Centering prayer. This is a kind of meditation that involves focusing on a spiritual word or phrase. Choose a word, phrase, or sacred image that is meaningful to you and that brings you peace.  Deep breathing. To do this, expand your stomach and inhale slowly through your nose. Hold your breath for 3-5 seconds, then exhale  slowly, allowing your stomach muscles to relax.  Muscle relaxation. This involves intentionally tensing muscles then relaxing them.  Choose a stress reduction technique that fits your lifestyle and personality. Stress reduction techniques take time and practice to develop. Set aside 5-15 minutes a day to do them. Therapists can offer training in these techniques. The training may be covered by some insurance plans. Other things you can do to manage stress include:  Keeping a stress diary. This can help you learn what triggers your stress and ways to control your response.  Understanding what your limits are and saying no to requests or events that lead to a schedule that is too full.  Thinking about how you respond to certain situations. You may not be able to control everything, but you can control how you react.  Adding humor to your life by watching funny films or TV shows.  Making time for activities that help you relax and not feeling guilty about spending your time this way.  Medicines Your health care provider may suggest certain medicines if he or she feels that they will help improve your condition. Avoid using alcohol and other substances that may prevent your medicines from working properly (may interact). It is also important to:  Talk with your pharmacist or health care provider about all the medicines that you take, their possible side effects, and what medicines are safe to take together.  Make it your goal to take part in all treatment decisions (shared decision-making). This includes giving input on the side   effects of medicines. It is best if shared decision-making with your health care provider is part of your total treatment plan.  If your health care provider prescribes a medicine, you may not notice the full benefits of it for 4-8 weeks. Most people who are treated for depression need to be on medicine for at least 6-12 months after they feel better. If you are taking  medicines as part of your treatment, do not stop taking medicines without first talking to your health care provider. You may need to have the medicine slowly decreased (tapered) over time to decrease the risk of harmful side effects. Relationships Your health care provider may suggest family therapy along with individual therapy and drug therapy. While there may not be family problems that are causing you to feel depressed, it is still important to make sure your family learns as much as they can about your mental health. Having your family's support can help make your treatment successful. How to recognize changes in your condition Everyone has a different response to treatment for depression. Recovery from major depression happens when you have not had signs of major depression for two months. This may mean that you will start to:  Have more interest in doing activities.  Feel less hopeless than you did 2 months ago.  Have more energy.  Overeat less often, or have better or improving appetite.  Have better concentration.  Your health care provider will work with you to decide the next steps in your recovery. It is also important to recognize when your condition is getting worse. Watch for these signs:  Having fatigue or low energy.  Eating too much or too little.  Sleeping too much or too little.  Feeling restless, agitated, or hopeless.  Having trouble concentrating or making decisions.  Having unexplained physical complaints.  Feeling irritable, angry, or aggressive.  Get help as soon as you or your family members notice these symptoms coming back. How to get support and help from others How to talk with friends and family members about your condition Talking to friends and family members about your condition can provide you with one way to get support and guidance. Reach out to trusted friends or family members, explain your symptoms to them, and let them know that you are  working with a health care provider to treat your depression. Financial resources Not all insurance plans cover mental health care, so it is important to check with your insurance carrier. If paying for co-pays or counseling services is a problem, search for a local or county mental health care center. They may be able to offer public mental health care services at low or no cost when you are not able to see a private health care provider. If you are taking medicine for depression, you may be able to get the generic form, which may be less expensive. Some makers of prescription medicines also offer help to patients who cannot afford the medicines they need. Follow these instructions at home:  Get the right amount and quality of sleep.  Cut down on using caffeine, tobacco, alcohol, and other potentially harmful substances.  Try to exercise, such as walking or lifting small weights.  Take over-the-counter and prescription medicines only as told by your health care provider.  Eat a healthy diet that includes plenty of vegetables, fruits, whole grains, low-fat dairy products, and lean protein. Do not eat a lot of foods that are high in solid fats, added sugars, or salt.    Keep all follow-up visits as told by your health care provider. This is important. Contact a health care provider if:  You stop taking your antidepressant medicines, and you have any of these symptoms: ? Nausea. ? Headache. ? Feeling lightheaded. ? Chills and body aches. ? Not being able to sleep (insomnia).  You or your friends and family think your depression is getting worse. Get help right away if:  You have thoughts of hurting yourself or others. If you ever feel like you may hurt yourself or others, or have thoughts about taking your own life, get help right away. You can go to your nearest emergency department or call:  Your local emergency services (911 in the [place]  A suicide crisis helpline, such as the  National Suicide Prevention Lifeline at 1-800-273-8255. This is open 24-hours a day.  Summary  If you are living with depression, there are ways to help you recover from it and also ways to prevent it from coming back.  Work with your health care team to create a management plan that includes counseling, stress management techniques, and healthy lifestyle habits. This information is not intended to replace advice given to you by your health care provider. Make sure you discuss any questions you have with your health care provider. Document Released: 11/30/2015 Document Revised: 11/30/2015 Document Reviewed: 11/30/2015 Elsevier Interactive Patient Education  2018 Elsevier Inc.  

## 2017-02-08 NOTE — Progress Notes (Signed)
History:  42 y.o. G4P1030 here today for f/u of depression. She was initially on Zoloft 25 mg for 1 week followed by 50mg  daily.  Pt reports that her moods are much better. She is still feeling grief at the loss of her son but, she is starting to find joy in activities. She reports that she has had no further ETOH since she ws hospitalized.      The following portions of the patient's history were reviewed and updated as appropriate: allergies, current medications, past family history, past medical history, past social history, past surgical history and problem list.  Review of Systems:  Pertinent items are noted in HPI.   Objective:  Physical Exam Height 5\' 11"  (1.803 m), weight 177 lb (80.3 kg), last menstrual period 01/16/2017. Gen: NAD Abd: Soft, nontender and nondistended Pelvic: Normal appearing external genitalia; normal appearing vaginal mucosa and cervix.  Normal discharge.  Small uterus, no other palpable masses, no uterine or adnexal tenderness  Labs and Imaging No results found.  Assessment & Plan:  Major depression.  ETOH abuse.  Keep Zoloft 50mg  1 po q day  F/u in 6 months  Pt cannot afford a counselor at present. Will consider if one can be found  Total face-to-face time with patient was 15 min.  Greater than 50% was spent in counseling and coordination of care with the patient.   Kjirsten Bloodgood L. Harraway-Smith, M.D., Cherlynn June

## 2017-02-09 ENCOUNTER — Encounter: Payer: Self-pay | Admitting: Obstetrics & Gynecology

## 2017-02-24 ENCOUNTER — Other Ambulatory Visit: Payer: Self-pay | Admitting: Medical

## 2017-05-25 IMAGING — US US EXTREM LOW VENOUS*L*
1 series · 13 of 24 positions shown · non-contrast
Comparison: None.

CLINICAL DATA: Posterior left knee swelling and tenderness 2 days.



[Series 1: us extrem low venous*left* · 0.08mm/px · 13 of 34 slices shown]
[im 1/34]
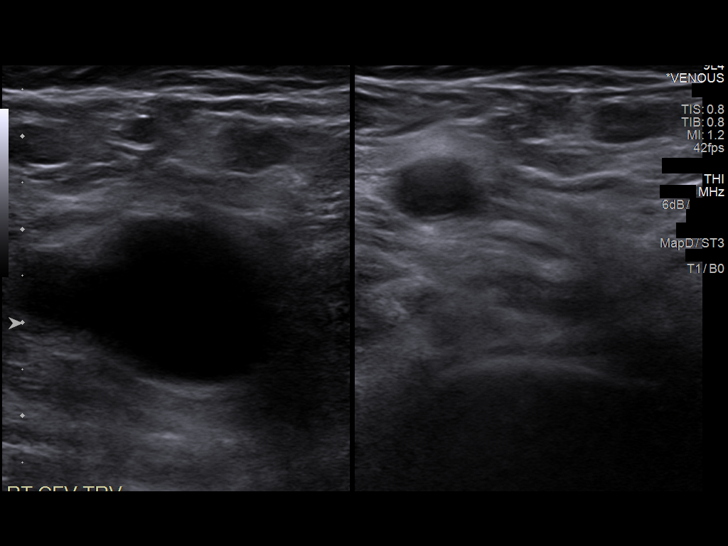
[im 3/34]
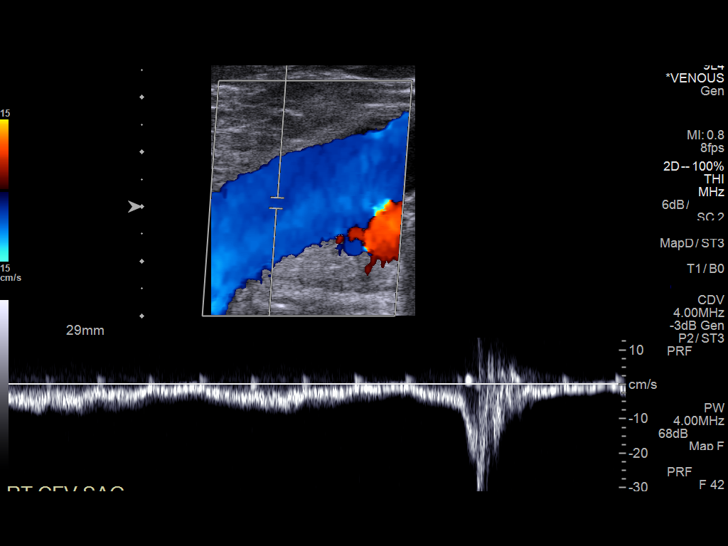
[im 6/34]
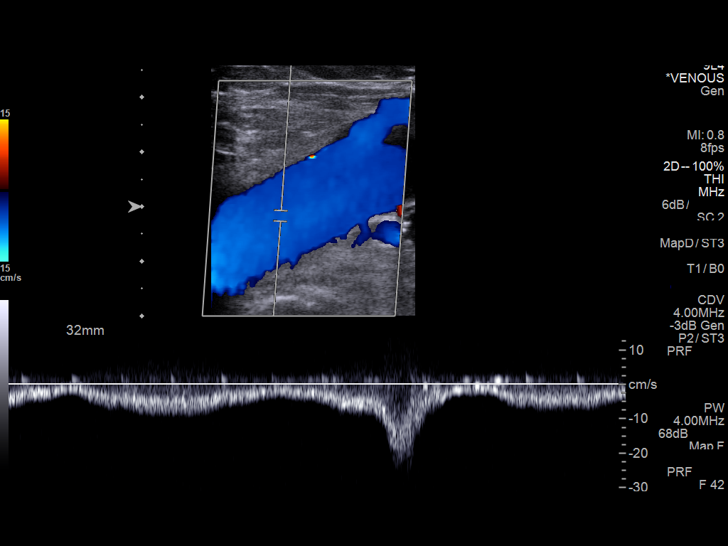
[im 9/34]
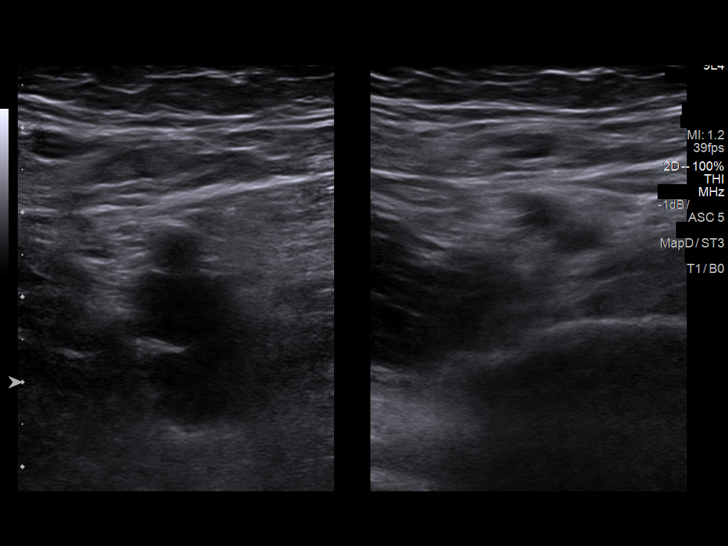
[im 12/34]
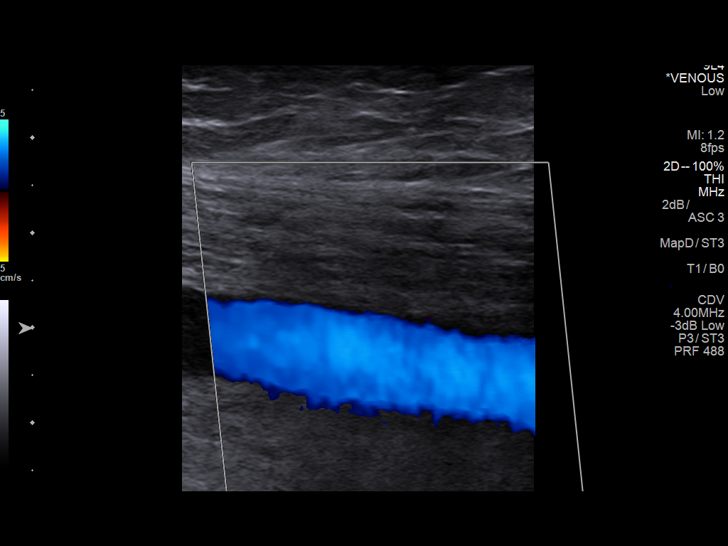
[im 15/34]
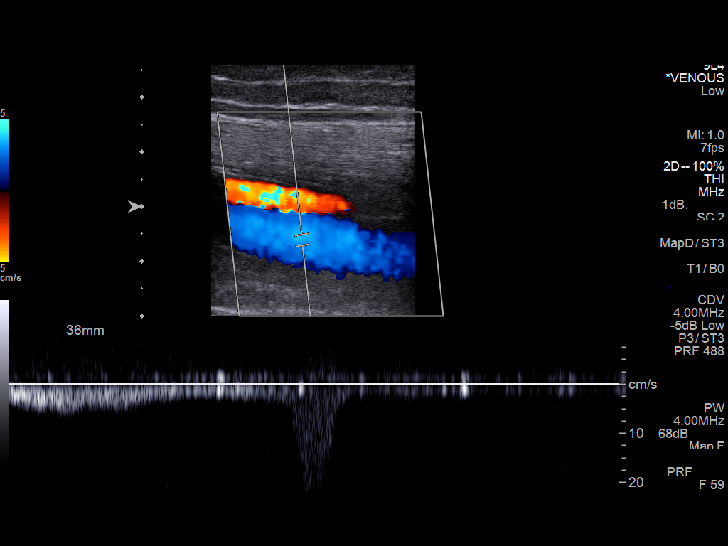
[im 18/34]
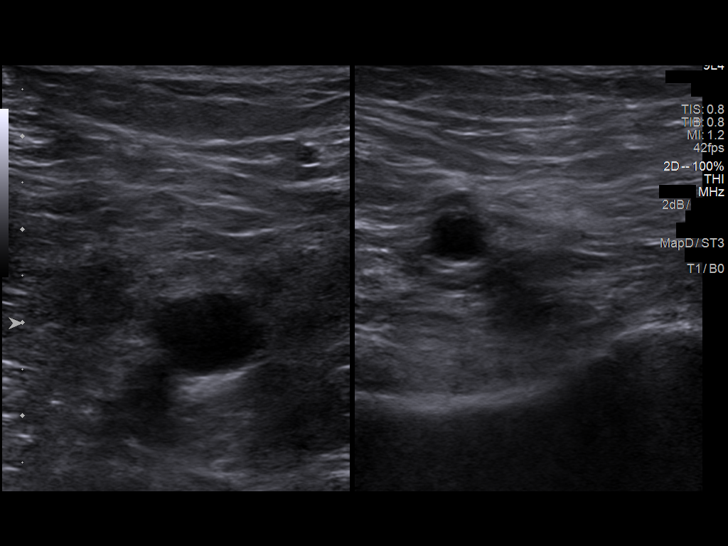
[im 19/34]
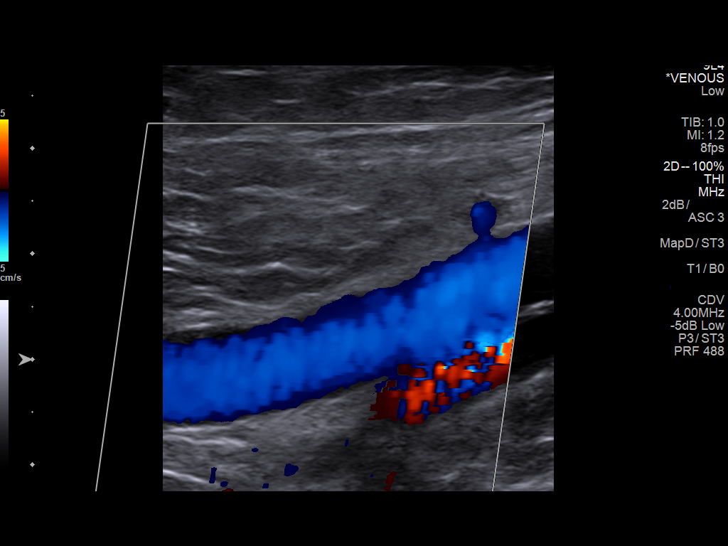
[im 22/34]
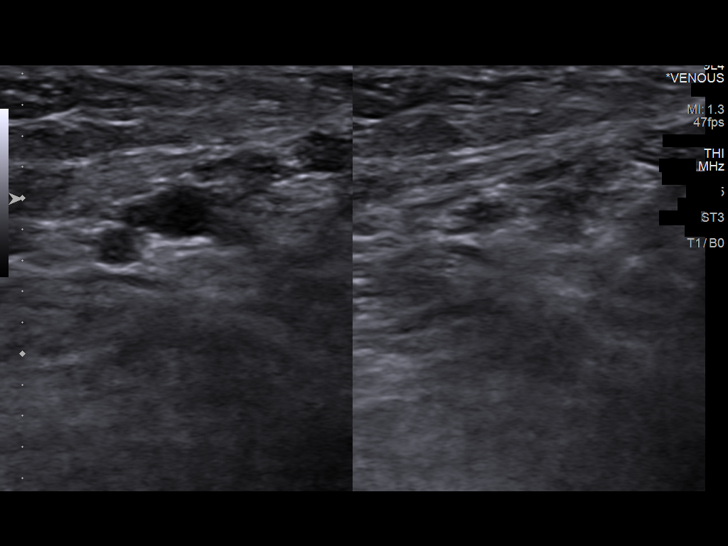
[im 25/34]
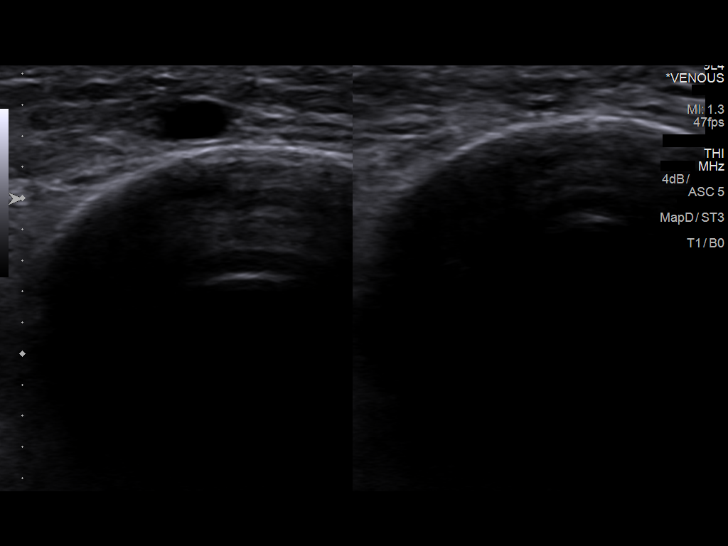
[im 28/34]
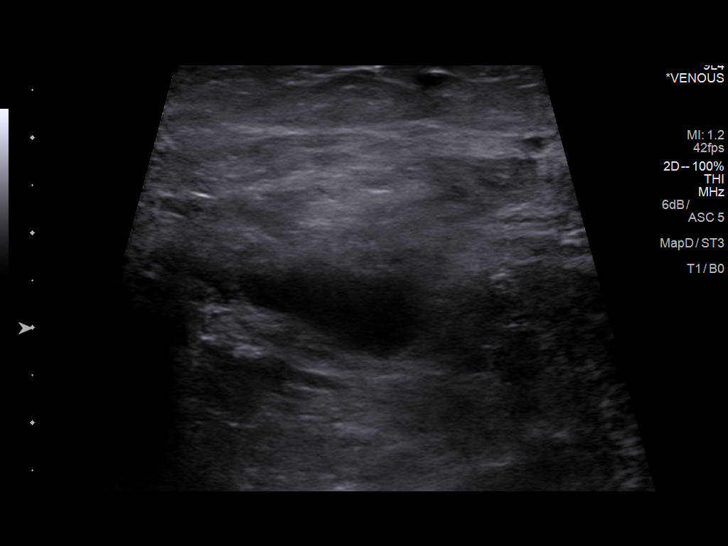
[im 31/34]
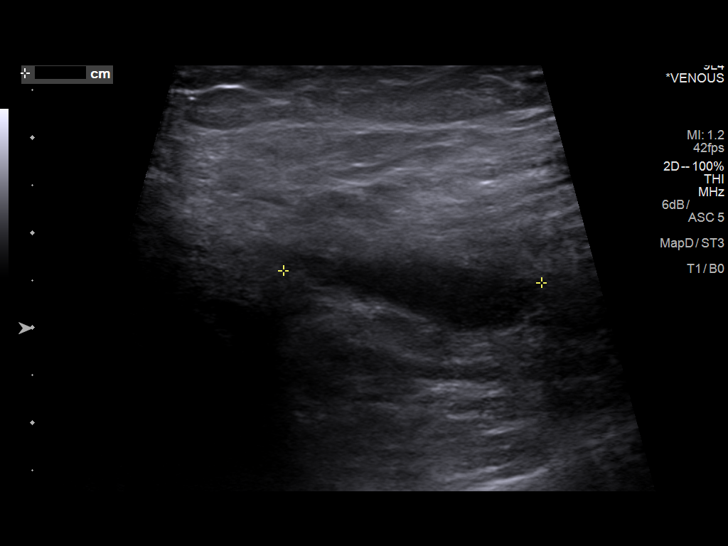
[im 34/34]
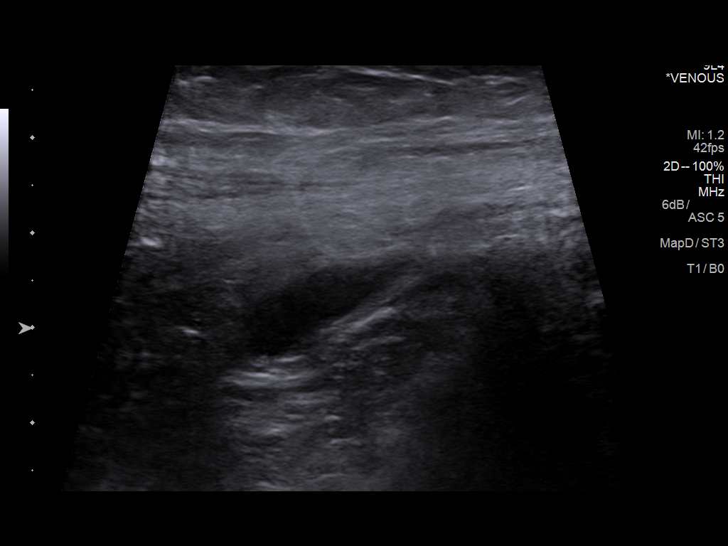

[13 of 24 positions shown; findings below may reference images not displayed]

FINDINGS: Contralateral Common Femoral Vein: Respiratory phasicity is normal
and symmetric with the symptomatic side. No evidence of thrombus.
Normal compressibility.

Common Femoral Vein: No evidence of thrombus. Normal
compressibility, respiratory phasicity and response to augmentation.

Saphenofemoral Junction: No evidence of thrombus. Normal
compressibility and flow on color Doppler imaging.

Profunda Femoral Vein: No evidence of thrombus. Normal
compressibility and flow on color Doppler imaging.

Femoral Vein: No evidence of thrombus. Normal compressibility,
respiratory phasicity and response to augmentation.

Popliteal Vein: No evidence of thrombus. Normal compressibility,
respiratory phasicity and response to augmentation.

Calf Veins: No evidence of thrombus. Normal compressibility and flow
on color Doppler imaging.

Superficial Great Saphenous Vein: No evidence of thrombus. Normal
compressibility and flow on color Doppler imaging.

Venous Reflux:  None.

Other Findings: Focal fluid collection over the medial left
popliteal fossa measuring 0.8 x 2 x 2.7 cm likely a Baker cyst.
IMPRESSION: No evidence of deep venous thrombosis.

Baker cyst over the left popliteal fossa measuring 0.8 x 2 x 2.7 cm.

## 2017-06-24 ENCOUNTER — Other Ambulatory Visit: Payer: Self-pay | Admitting: Medical

## 2017-06-24 DIAGNOSIS — I10 Essential (primary) hypertension: Secondary | ICD-10-CM

## 2017-06-28 NOTE — Telephone Encounter (Signed)
Refilled lisinopril. It was on her med list in February. She had 90 tab rx with no refills.So I did fill the rx.

## 2017-06-28 NOTE — Telephone Encounter (Signed)
Pt requesting lisinopril rx refill, has not been seen since 09/02/2016. Routed to [place], [place] to advise.

## 2017-07-26 ENCOUNTER — Ambulatory Visit: Payer: PRIVATE HEALTH INSURANCE | Admitting: Obstetrics & Gynecology

## 2017-07-26 DIAGNOSIS — Z09 Encounter for follow-up examination after completed treatment for conditions other than malignant neoplasm: Secondary | ICD-10-CM

## 2017-09-27 ENCOUNTER — Other Ambulatory Visit: Payer: Self-pay | Admitting: Medical

## 2017-09-27 DIAGNOSIS — I10 Essential (primary) hypertension: Secondary | ICD-10-CM

## 2017-09-28 NOTE — Telephone Encounter (Signed)
LOV 08/08/2016. Please advise on refilling requested lisinopril/need for OV. Last refilled 06/28/17, #90, 0RF. Order left pended for PCP review.

## 2017-09-28 NOTE — Telephone Encounter (Signed)
Did refill pt lisinopril. Also if you would ask her to set up appointment by mid October.

## 2017-10-04 NOTE — Telephone Encounter (Signed)
Author phoned pt. To schedule OV per Mackie Pai. OV made for 10/2 at 0840AM, pt. Appreciative.

## 2017-10-11 ENCOUNTER — Ambulatory Visit: Payer: PRIVATE HEALTH INSURANCE | Admitting: Medical

## 2018-01-18 ENCOUNTER — Telehealth: Payer: Self-pay

## 2018-01-18 NOTE — Telephone Encounter (Signed)
Left message for patient that we need to schedule her for annual exam and follow up on medication.  Asked her to call office to schedule appointment.  Received refill request (Zoloft 50mg  )from [facility] and did NOT refill at this time since she never did follow up from starting medication and she is due for annual exam (abnormal pap Dec 2018)

## 2018-05-10 IMAGING — MR MR ABDOMEN WO/W CM MRCP
10 of 19 series · 19 of 48 positions shown · IV contrast (multihance)
Comparison: CT abdomen/ pelvis dated 07/09/2016

CLINICAL DATA: Liver mass on CT, evaluate for hepatocellular
carcinoma

EXAM:
MRI ABDOMEN WITHOUT AND WITH CONTRAST (INCLUDING MRCP)
TECHNIQUE: Multiplanar multisequence MR imaging of the abdomen was performed
both before and after the administration of intravenous contrast.
Heavily T2-weighted images of the biliary and pancreatic ducts were
obtained, and three-dimensional MRCP images were rendered by post
processing.
CONTRAST:  8mL MULTIHANCE GADOBENATE DIMEGLUMINE 529 MG/ML IV SOLN

[Series 6: ax dualecho · axial · 5.0mm · 0.78mm/px · 1 of 74 slices shown]
[im 1/74]
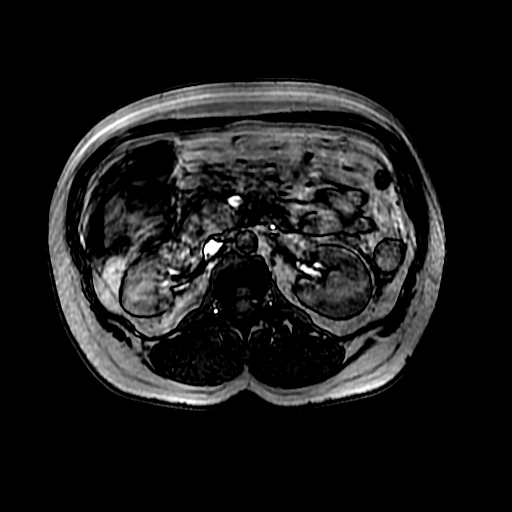

[Series 7: T2 · coronal · 5.0mm · 0.70mm/px · 1 of 43 slices shown (1 of 2)]
[im 1/43]
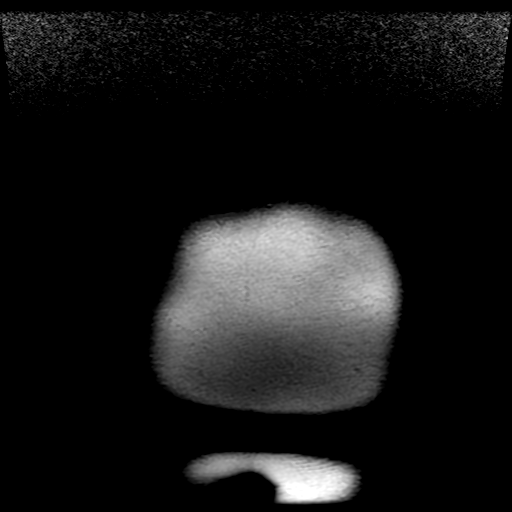

[Series 10: DWI b500 · axial · 6.0mm · 1.41mm/px · z∈[-0,+226]mm · 2 of 60 slices shown]
[im 1/60]
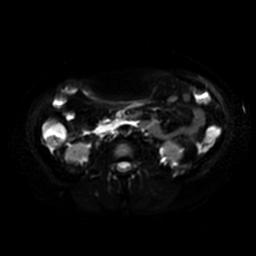
[im 60/60]
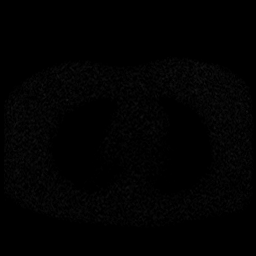

[Series 15: MRCP · coronal · 50.0mm · 0.70mm/px · 1 of 6 slices shown]
[im 1/6]
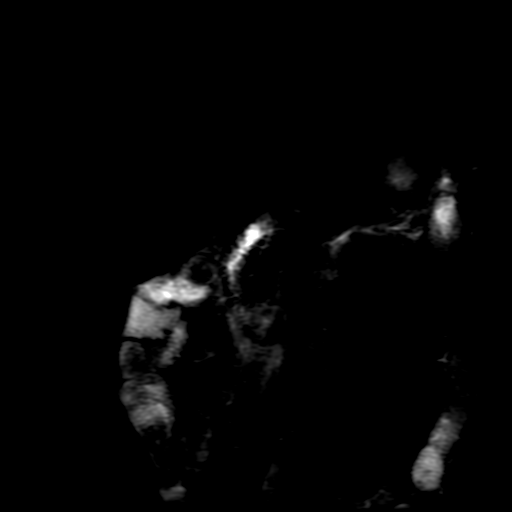

[Series 18: T2 fat-sat · axial · 5.0mm · 0.78mm/px · z∈[+5,+235]mm · 2 of 47 slices shown]
[im 1/47]
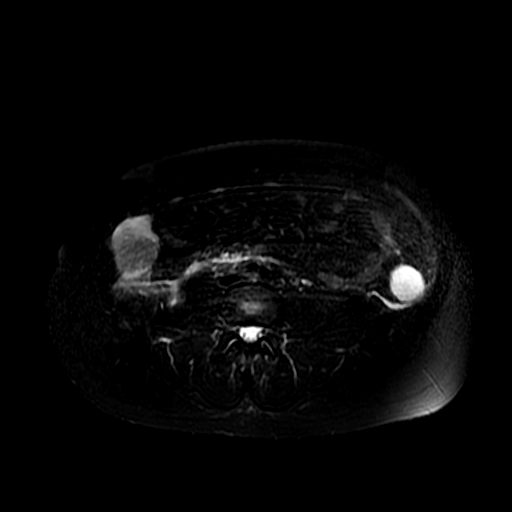
[im 47/47]
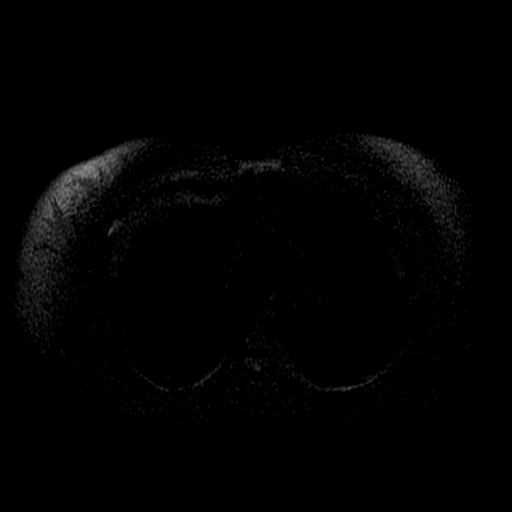

[Series 19: T2 · axial · 5.0mm · 0.78mm/px · z∈[+5,+235]mm · 2 of 47 slices shown (2 of 2)]
[im 1/47]
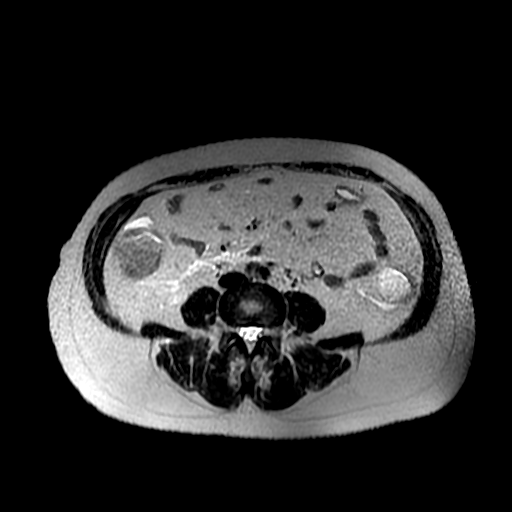
[im 47/47]
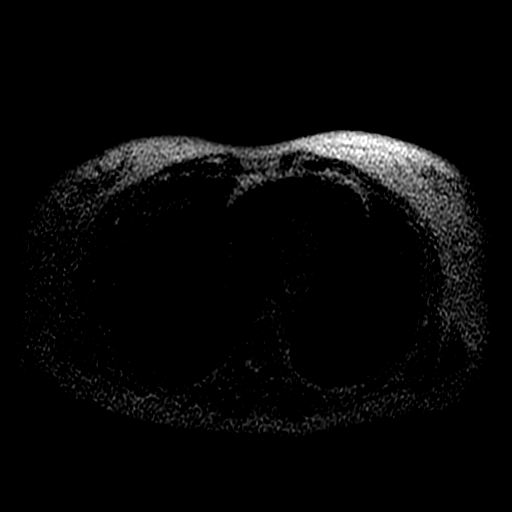

[Series 24: T1 dynamic post-contrast · coronal · 5.0mm · 0.74mm/px · 3 of 88 slices shown]
[im 1/88]
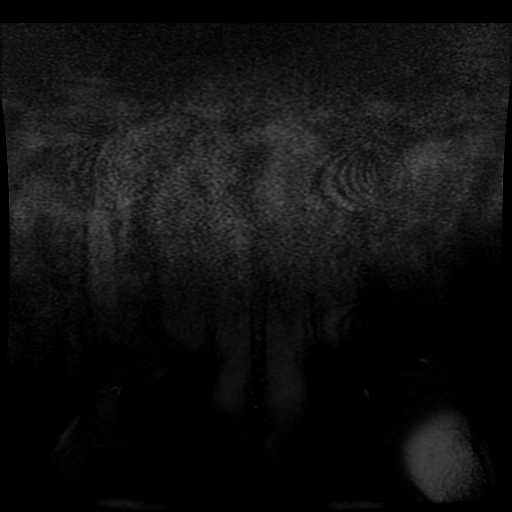
[im 44/88]
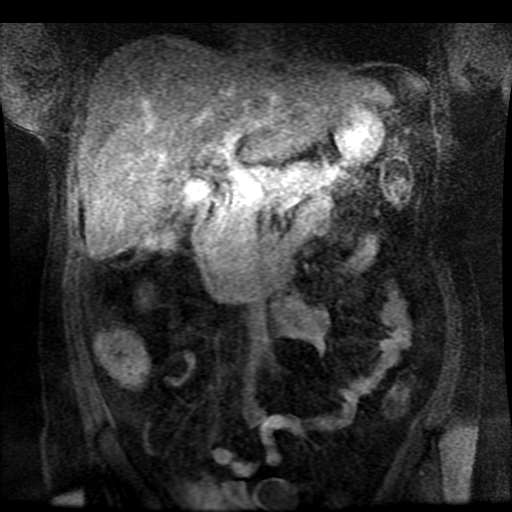
[im 88/88]
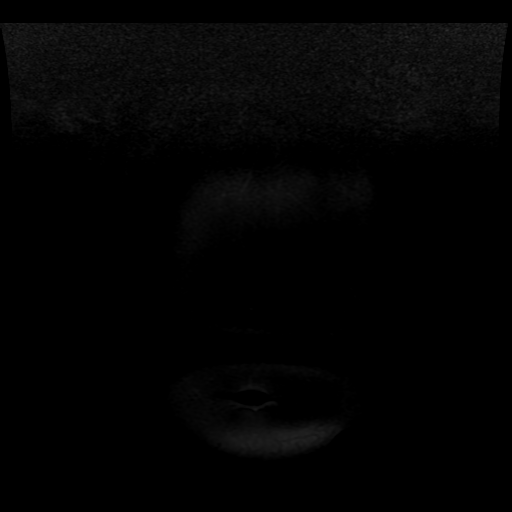

[Series 2100: T1 dynamic · axial · 5.0mm · 0.78mm/px · z∈[+8,+225]mm · 3 of 88 slices shown (1 of 3)]
[im 1/88]
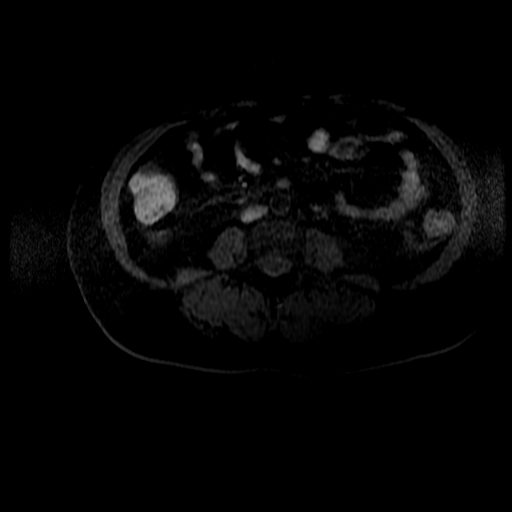
[im 44/88]
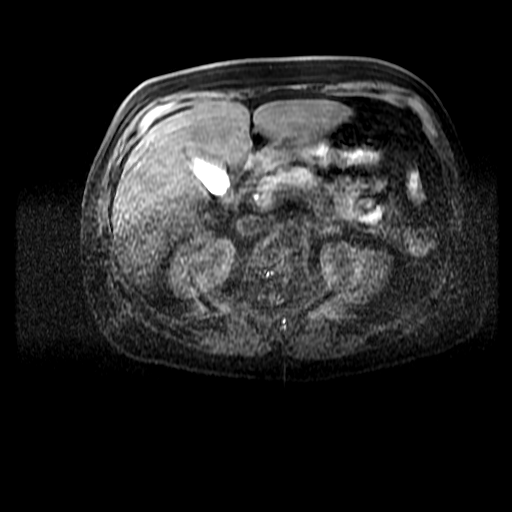
[im 88/88]
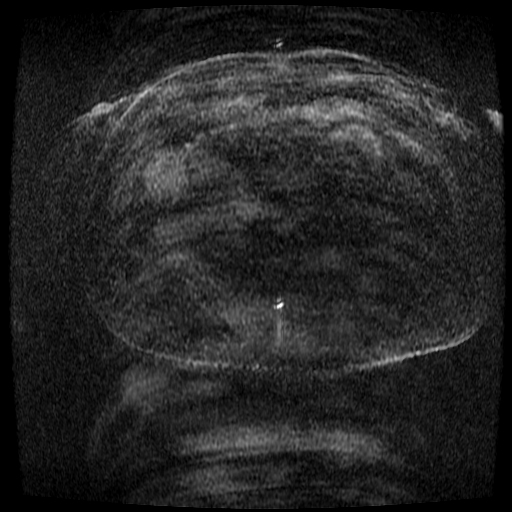

[Series 2200: T1 dynamic · axial · 5.0mm · 0.78mm/px · z∈[+11,+228]mm · 3 of 88 slices shown (2 of 3)]
[im 1/88]
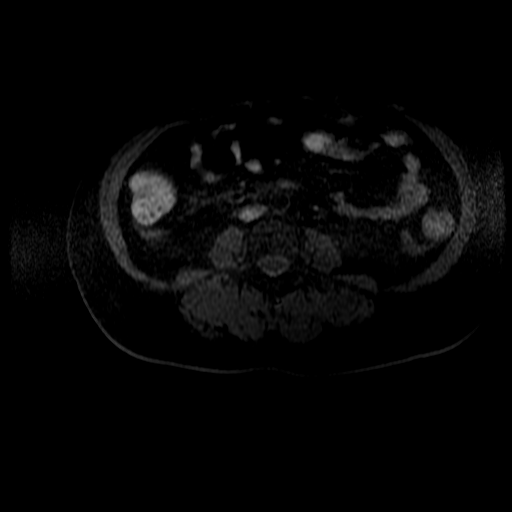
[im 44/88]
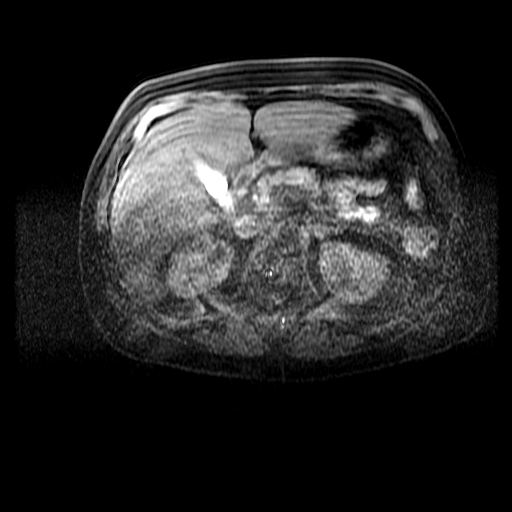
[im 88/88]
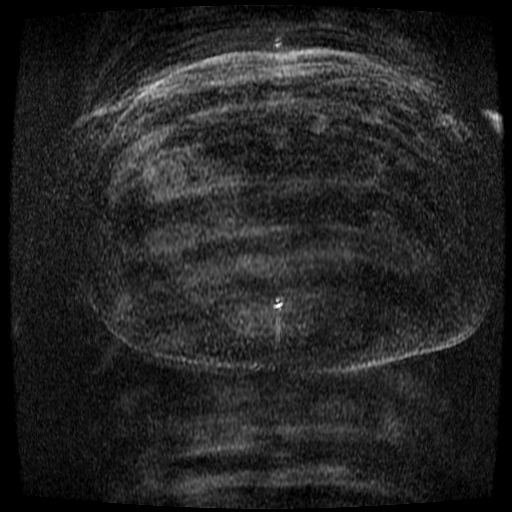

[Series 2300: T1 dynamic · axial · 5.0mm · 0.78mm/px · 1 of 88 slices shown (3 of 3)]
[im 1/88]
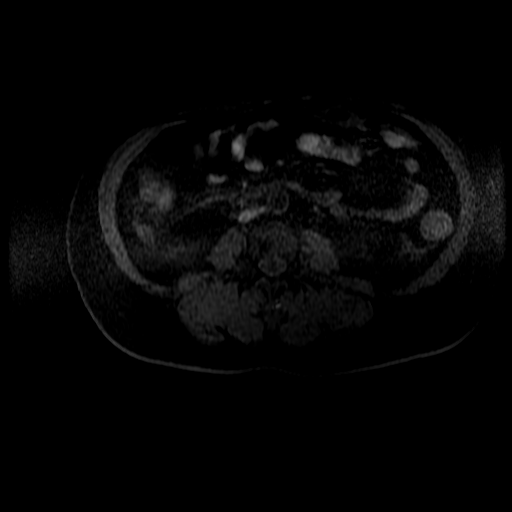

[19 of 48 positions shown; findings below may reference images not displayed]

FINDINGS: Motion degraded images.

Lower chest: Lung bases are essentially clear.

Hepatobiliary: Severe hepatic steatosis. Apparent heterogeneous
perfusion of the posterior right hepatic lobe is favored to reflect
phase encoding artifact (for example, series 2772/image 30). No
suspicious/enhancing hepatic lesions.

The focal hypoenhancing lesion on CT is not evident on MRI, although
this may be related to technical limitations.

Mildly wavy hepatic contour, without definite findings to suggest
cirrhosis.

Gallbladder is unremarkable.  No evidence of cholelithiasis.

No intrahepatic or extrahepatic ductal dilatation. Common duct
measures 3 mm. No choledocholithiasis is seen.

Pancreas: Peripancreatic inflammatory changes/ fluid, reflecting
acute pancreatitis. No underlying pancreatic mass. No pancreatic
ductal dilatation. No evidence of pseudocyst.

Spleen:  Within normal limits.

Adrenals/Urinary Tract: 1.8 cm left adrenal nodule (series 18/ image
16) with suspected signal loss on opposed phase imaging, compatible
with a benign adrenal adenoma. Right adrenal glands within normal
limits.

Kidneys are within normal limits.  No hydronephrosis.

Stomach/Bowel: Stomach and visualized bowel are unremarkable.

Vascular/Lymphatic:  No evidence of abdominal aortic aneurysm.

No suspicious abdominal lymphadenopathy.

Other: Small volume upper abdominal ascites/mesenteric fluid,
related to acute pancreatitis.

Musculoskeletal: No focal osseous lesions.
IMPRESSION: Focal hepatic lesion on CT is not evident on MRI, although this may
be related to technical limitations. Regardless, this is not
considered concerning for hepatocellular carcinoma given the lack of
cirrhosis, in the absence of known hepatitis B. Consider follow-up
liver protocol CT abdomen with/without contrast in 3-6 months for
further characterization.

Underlying severe hepatic steatosis.

1.8 cm left adrenal nodule, favoring a benign adrenal adenoma.

Acute uncomplicated pancreatitis.

No cholelithiasis or choledocholithiasis is seen. Common duct
measures 3 mm.

## 2018-08-09 ENCOUNTER — Other Ambulatory Visit: Payer: Self-pay | Admitting: Medical

## 2018-08-09 DIAGNOSIS — I10 Essential (primary) hypertension: Secondary | ICD-10-CM

## 2018-08-10 NOTE — Telephone Encounter (Signed)
Tried to reach pt. Pt has no vm. Pt is due for follow up.

## 2018-09-06 ENCOUNTER — Other Ambulatory Visit: Payer: Self-pay | Admitting: Medical

## 2018-09-06 DIAGNOSIS — I10 Essential (primary) hypertension: Secondary | ICD-10-CM

## 2018-09-06 NOTE — Telephone Encounter (Signed)
Tired to reach pt no vm. Pt needs an appointment before further refills.

## 2018-09-06 NOTE — Telephone Encounter (Signed)
No answer, no voicemail, sent mychart msg requesting pt to call for appt.

## 2019-01-21 ENCOUNTER — Telehealth: Payer: Self-pay | Admitting: Medical

## 2019-01-21 NOTE — Telephone Encounter (Signed)
Copied from [place] [id number]. Topic: Appointment Scheduling - Scheduling Inquiry for Clinic >> Jan 21, 2019  3:25 PM Terri Morris wrote: Reason for CRM: patient is wanting an  CPE appt for Wednesday [id number]. Please advise

## 2019-01-21 NOTE — Telephone Encounter (Signed)
Spoke to patient and scheduled

## 2019-02-04 ENCOUNTER — Ambulatory Visit: Payer: Self-pay | Admitting: Medical

## 2019-02-04 DIAGNOSIS — Z0289 Encounter for other administrative examinations: Secondary | ICD-10-CM

## 2019-10-10 ENCOUNTER — Other Ambulatory Visit: Payer: Self-pay | Admitting: Medical

## 2019-10-10 ENCOUNTER — Other Ambulatory Visit: Payer: Self-pay

## 2019-10-10 DIAGNOSIS — F320 Major depressive disorder, single episode, mild: Secondary | ICD-10-CM

## 2019-10-10 DIAGNOSIS — I10 Essential (primary) hypertension: Secondary | ICD-10-CM

## 2019-10-11 MED ORDER — SERTRALINE HCL 25 MG PO TABS: 25.0000 mg | ORAL_TABLET | Freq: Every day | ORAL | 0 refills | Status: DC

## 2021-07-09 ENCOUNTER — Other Ambulatory Visit: Payer: Self-pay

## 2021-07-09 ENCOUNTER — Telehealth: Payer: Self-pay

## 2021-07-09 ENCOUNTER — Encounter (HOSPITAL_BASED_OUTPATIENT_CLINIC_OR_DEPARTMENT_OTHER): Payer: Self-pay | Admitting: Emergency Medicine

## 2021-07-09 ENCOUNTER — Emergency Department (HOSPITAL_BASED_OUTPATIENT_CLINIC_OR_DEPARTMENT_OTHER)
Admission: EM | Admit: 2021-07-09 | Discharge: 2021-07-09 | Disposition: A | Discharge status: Home / Self Care | Payer: Self-pay | Attending: Emergency Medicine | Admitting: Emergency Medicine

## 2021-07-09 ENCOUNTER — Emergency Department (HOSPITAL_BASED_OUTPATIENT_CLINIC_OR_DEPARTMENT_OTHER): Payer: Self-pay

## 2021-07-09 DIAGNOSIS — Z79899 Other long term (current) drug therapy: Secondary | ICD-10-CM | POA: Insufficient documentation

## 2021-07-09 DIAGNOSIS — Z7982 Long term (current) use of aspirin: Secondary | ICD-10-CM | POA: Insufficient documentation

## 2021-07-09 DIAGNOSIS — R600 Localized edema: Secondary | ICD-10-CM | POA: Insufficient documentation

## 2021-07-09 DIAGNOSIS — R0602 Shortness of breath: Secondary | ICD-10-CM | POA: Insufficient documentation

## 2021-07-09 DIAGNOSIS — I10 Essential (primary) hypertension: Secondary | ICD-10-CM | POA: Insufficient documentation

## 2021-07-09 LAB — BASIC METABOLIC PANEL
Anion gap: 12 (ref 5–15)
BUN: 11 mg/dL (ref 6–20)
CO2: 22 mmol/L (ref 22–32)
Calcium: 8.5 mg/dL — ABNORMAL LOW (ref 8.9–10.3)
Chloride: 109 mmol/L (ref 98–111)
Creatinine, Ser: 0.59 mg/dL (ref 0.44–1.00)
GFR, Estimated: >60 mL/min (ref >60)
Glucose, Bld: 99 mg/dL (ref 70–99)
Potassium: 3.8 mmol/L (ref 3.5–5.1)
Sodium: 143 mmol/L (ref 135–145)

## 2021-07-09 LAB — CBC WITH DIFFERENTIAL/PLATELET
Abs Immature Granulocytes: 0.01 10*3/uL (ref 0.00–0.07)
Basophils Absolute: 0 10*3/uL (ref 0.0–0.1)
Basophils Relative: 1 %
Eosinophils Absolute: 0.1 10*3/uL (ref 0.0–0.5)
Eosinophils Relative: 1 %
HCT: 31.5 % — ABNORMAL LOW (ref 36.0–46.0)
Hemoglobin: 10.9 g/dL — ABNORMAL LOW (ref 12.0–15.0)
Immature Granulocytes: 0 %
Lymphocytes Relative: 66 %
Lymphs Abs: 3.8 10*3/uL (ref 0.7–4.0)
MCH: 31.3 pg (ref 26.0–34.0)
MCHC: 34.6 g/dL (ref 30.0–36.0)
MCV: 90.5 fL (ref 80.0–100.0)
Monocytes Absolute: 0.4 10*3/uL (ref 0.1–1.0)
Monocytes Relative: 6 %
Neutro Abs: 1.5 10*3/uL — ABNORMAL LOW (ref 1.7–7.7)
Neutrophils Relative %: 26 %
Platelets: 328 10*3/uL (ref 150–400)
RBC: 3.48 MIL/uL — ABNORMAL LOW (ref 3.87–5.11)
RDW: 23.6 % — ABNORMAL HIGH (ref 11.5–15.5)
Smear Review: NORMAL
WBC: 5.8 10*3/uL (ref 4.0–10.5)
nRBC: 0 % (ref 0.0–0.2)

## 2021-07-09 LAB — D-DIMER, QUANTITATIVE: D-Dimer, Quant: <0.27 ug/mL-FEU (ref 0.00–0.50)

## 2021-07-09 LAB — TROPONIN I (HIGH SENSITIVITY): Troponin I (High Sensitivity): 17 ng/L (ref <18)

## 2021-07-09 LAB — BRAIN NATRIURETIC PEPTIDE: B Natriuretic Peptide: 96.6 pg/mL (ref 0.0–100.0)

## 2021-07-09 MED ORDER — AMLODIPINE BESYLATE 5 MG PO TABS: 5.0000 mg | ORAL_TABLET | Freq: Every day | ORAL | 0 refills | Status: DC

## 2021-07-09 MED ORDER — ALBUTEROL SULFATE HFA 108 (90 BASE) MCG/ACT IN AERS: 2.0000 | INHALATION_SPRAY | RESPIRATORY_TRACT | Status: DC | PRN

## 2021-07-09 MED ORDER — FUROSEMIDE 20 MG PO TABS: 20.0000 mg | ORAL_TABLET | Freq: Every day | ORAL | 0 refills | Status: DC

## 2021-07-09 NOTE — ED Notes (Signed)
Patient verbalizes understanding of discharge instructions. Opportunity for questioning and answers were provided. Armband removed by staff, pt discharged from ED. Ambulated out to lobby, rode home with husband

## 2021-07-09 NOTE — Telephone Encounter (Signed)
Nurse Assessment Nurse: Donna Christen, RN, Legrand Como Date/Time Eilene Ghazi Time): 07/09/2021 1:47:39 PM Confirm and document reason for call. If symptomatic, describe symptoms. ---Caller states she is having rectal bleeding. Feeling SOB while walking. States she went this weekend to see her grandson play softball and on the way home symptoms started. Takes ASA. Entire toilet was red with blood with stool. Does the patient have any new or worsening symptoms? ---Yes Will a triage be completed? ---Yes Related visit to physician within the last 2 weeks? ---No Does the PT have any chronic conditions? (i.e. diabetes, asthma, this includes High risk factors for pregnancy, etc.) ---Yes List chronic conditions. ---CHF, DM2, Rental Failure, Liver disease Is the patient pregnant or possibly pregnant? (Ask all females between the ages of 65-55) ---No Is this a behavioral health or substance abuse call? ---No Guidelines Guideline Title Affirmed Question Affirmed Notes Nurse Date/Time (Eastern Time) Breathing Difficulty History of prior "blood clot" in leg or lungs (i.e., deep vein thrombosis, pulmonary embolism) Darleen Crocker 07/09/2021 1:51:15 PM PLEASE NOTE: All timestamps contained within this report are represented as [place] Standard Time. CONFIDENTIALTY NOTICE: This fax transmission is intended only for the addressee. It contains information that is legally privileged, confidential or otherwise protected from use or disclosure. If you are not the intended recipient, you are strictly prohibited from reviewing, disclosing, copying using or disseminating any of this information or taking any action in reliance on or regarding this information. If you have received this fax in error, please notify us immediately by telephone so that we can arrange for its return to [place]. Phone: [phone], Toll-Free: [phone], Fax: [phone] Page: 2 of 2 Call Id: [id number] [place]. Time Eilene Ghazi Time)  Disposition Final User 07/09/2021 1:44:35 PM Send to Urgent Ivan Croft 07/09/2021 1:55:49 PM Go to ED Now Yes Donna Christen, RN, Legrand Como Final Disposition 07/09/2021 1:55:49 PM Go to ED Now Yes Donna Christen, RN, Gerome Sam Disagree/Comply Comply Caller Understands Yes PreDisposition InappropriateToAsk Care Advice Given Per Guideline GO TO ED NOW: CARE ADVICE given per Breathing Difficulty (Adult) guideline. Comments User: Tresa Moore, RN Date/Time Eilene Ghazi Time): 07/09/2021 1:52:37 PM Elevated legs and took Lasix User: Tresa Moore, RN Date/Time Eilene Ghazi Time): 07/09/2021 1:53:12 PM Worse when laying flat. User: Tresa Moore, RN Date/Time Eilene Ghazi Time): 07/09/2021 1:58:26 PM Caller states she would have her husband drive her to ER in [place]. Referrals [facility] - ED

## 2021-07-09 NOTE — Discharge Instructions (Addendum)
Please call and follow-up with your cardiologist in the office for your visit to the ER today.  Your blood pressure was also quite high.  Please buy a blood pressure cuff and keep track of your daily blood pressures.  Bring these to the heart doctor's office.

## 2021-07-09 NOTE — ED Provider Notes (Signed)
[facility] EMERGENCY DEPARTMENT Provider Note   CSN: [id number] Arrival date & time: 07/09/21  1717     History  Chief Complaint  Patient presents with   Shortness of Breath    Terri Morris is a 46 y.o. female presenting to the emergency department shortness of breath.  The patient reports that 4 days ago she returned from a car ride to [place], approximately 4 hours in the car,and said that her legs were both swollen, and she felt short of breath.  She has been feeling short of breath since then.  She took a as needed 20 mg Lasix tablet but it did not help her shortness of breath.  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  She says she is on lisinopril only for blood pressure.  She knows that she has hypertension.  She attributes this to anxiety.  HPI     Home Medications Prior to Admission medications   Medication Sig Start Date End Date Taking? Authorizing Provider  amLODipine (NORVASC) 5 MG tablet Take 1 tablet (5 mg total) by mouth daily for 30 doses. 07/09/21 08/08/21 Yes Mikal Blasdell, Carola Rhine, MD  furosemide (LASIX) 20 MG tablet Take 1 tablet (20 mg total) by mouth daily for 7 days. 07/09/21 07/16/21 Yes Wyvonnia Dusky, MD  aspirin EC 81 MG tablet Take 162 mg by mouth daily.    [provider]  ferrous sulfate 325 (65 FE) MG EC tablet Take 1 tablet (325 mg total) by mouth 3 (three) times daily with meals. Patient not taking: Reported on 12/29/2016 01/13/16   Saguier, Percell Miller, PA-C  folic acid (FOLVITE) 1 MG tablet Take 1 tablet (1 mg total) by mouth daily. Patient not taking: Reported on 12/29/2016 08/29/16   Saguier, Percell Miller, PA-C  furosemide (LASIX) 20 MG tablet Take 1 tablet (20 mg total) by mouth as needed (for weight greater than 178 lbs). 07/26/16   Darrick Grinder D, NP  hydrOXYzine (ATARAX/VISTARIL) 25 MG tablet 1 tab po q hs as needed insomnia Patient not  taking: Reported on 02/08/2017 08/08/16   Saguier, Percell Miller, PA-C  lisinopril (ZESTRIL) 20 MG tablet TAKE 1 TABLET BY MOUTH DAILY 08/10/18   Saguier, Percell Miller, PA-C  metFORMIN (GLUCOPHAGE) 500 MG tablet Take 1 tablet (500 mg total) by mouth 2 (two) times daily with a meal. 09/02/16   Saguier, Percell Miller, PA-C  nicotine (NICODERM CQ - DOSED IN MG/24 HOURS) 21 mg/24hr patch Place 1 patch (21 mg total) onto the skin daily. Patient not taking: Reported on 02/08/2017 07/17/16   Jani Gravel, MD  ondansetron (ZOFRAN ODT) 8 MG disintegrating tablet Take 1 tablet (8 mg total) by mouth every 8 (eight) hours as needed for nausea or vomiting. Patient not taking: Reported on 12/29/2016 08/06/16   Saguier, Percell Miller, PA-C  sertraline (ZOLOFT) 25 MG tablet Take 1 tablet (25 mg total) by mouth daily. For 1 week. Then increase to '50mg'$  daily. 10/11/19   Lavonia Drafts, MD  sertraline (ZOLOFT) 50 MG tablet Take 1 tablet (50 mg total) by mouth daily. 12/29/16   Lavonia Drafts, MD  sertraline (ZOLOFT) 50 MG tablet Take 1 tablet (50 mg total) by mouth daily. 02/08/17   Lavonia Drafts, MD  thiamine 100 MG tablet Take 1 tablet (100 mg total) by mouth daily. Patient not taking: Reported on 02/08/2017 07/17/16   Jani Gravel, MD  traMADol Veatrice Bourbon) 50 MG tablet  Take 1-2 tablets (50-100 mg total) by mouth every 6 (six) hours as needed (pain). Patient not taking: Reported on 02/08/2017 07/25/16   Saguier, Percell Miller, PA-C      Allergies    Ferrous sulfate    Review of Systems   Review of Systems  Physical Exam Updated Vital Signs BP (!) 185/96   Pulse 97   Temp 98.4 F (36.9 C) (Oral)   Resp 20   SpO2 100%  Physical Exam Constitutional:      General: She is not in acute distress. HENT:     Head: Normocephalic and atraumatic.  Eyes:     Conjunctiva/sclera: Conjunctivae normal.     Pupils: Pupils are equal, round, and reactive to light.  Cardiovascular:     Rate and Rhythm: Normal rate and regular rhythm.   Pulmonary:     Effort: Pulmonary effort is normal. No respiratory distress.     Breath sounds: Normal breath sounds.  Abdominal:     General: There is no distension.     Tenderness: There is no abdominal tenderness.  Musculoskeletal:     Right lower leg: Edema present.     Left lower leg: Edema present.  Skin:    General: Skin is warm and dry.  Neurological:     General: No focal deficit present.     Mental Status: She is alert. Mental status is at baseline.  Psychiatric:        Mood and Affect: Mood normal.        Behavior: Behavior normal.     ED Results / Procedures / Treatments   Labs (all labs ordered are listed, but only abnormal results are displayed) Labs Reviewed  BASIC METABOLIC PANEL - Abnormal; Notable for the following components:      Result Value   Calcium 8.5 (*)    All other components within normal limits  CBC WITH DIFFERENTIAL/PLATELET - Abnormal; Notable for the following components:   RBC 3.48 (*)    Hemoglobin 10.9 (*)    HCT 31.5 (*)    RDW 23.6 (*)    Neutro Abs 1.5 (*)    All other components within normal limits  BRAIN NATRIURETIC PEPTIDE  D-DIMER, QUANTITATIVE  TROPONIN I (HIGH SENSITIVITY)  TROPONIN I (HIGH SENSITIVITY)    EKG EKG Interpretation  Date/Time:  Friday July 09 2021 17:35:31 EDT Ventricular Rate:  99 PR Interval:  116 QRS Duration: 74 QT Interval:  364 QTC Calculation: 467 R Axis:   56 Text Interpretation: Normal sinus rhythm Normal ECG When compared with ECG of 09-Jul-2016 21:31, PREVIOUS ECG IS PRESENT Confirmed by Octaviano Glow [phone]) on 07/09/2021 9:25:41 PM  Radiology DG Chest 2 View  Result Date: 07/09/2021 CLINICAL DATA:  SOB EXAM: CHEST - 2 VIEW COMPARISON:  Chest x-ray 08/08/2016, CT chest 05/31/2015 FINDINGS: The heart and mediastinal contours are unchanged. Vague density overlying the right peripheral upper lobe likely related to EKG wires. No focal consolidation. No pulmonary edema. No pleural effusion. No  pneumothorax. No acute osseous abnormality. IMPRESSION: No active cardiopulmonary disease. Electronically Signed   By: Iven Finn M.D.   On: 07/09/2021 17:45    Procedures Procedures    Medications Ordered in ED Medications  albuterol (VENTOLIN HFA) 108 (90 Base) MCG/ACT inhaler 2 puff (has no administration in time range)    ED Course/ Medical Decision Making/ A&P Clinical Course as of 07/09/21 2317  Fri Jul 09, 2021  2212 D-Dimer, Quant: <0.27 [MT]  2239 Remained stable and asymptomatic.  We discussed her high blood pressure.  We will start her on amlodipine but she will need to follow-up with a cardiologist for this issue.  Do not see evidence of acute PE, no evidence of congestive heart failure per her labs or clinical exam or x-ray finding.  I think she is reasonably safe and stable for discharge.   [MT]    Clinical Course User Index [MT] Christop Hippert, Carola Rhine, MD                           Medical Decision Making Amount and/or Complexity of Data Reviewed Labs: ordered. Decision-making details documented in ED Course. Radiology: ordered.  Risk Prescription drug management.   This patient presents to the ED with concern for shortness of breath, leg swelling. This involves an extensive number of treatment options, and is a complaint that carries with it a high risk of complications and morbidity.  The differential diagnosis includes CHF versus pulmonary embolism versus anemia versus pneumonia versus other  Co-morbidities that complicate the patient evaluation: History of congestive heart failure and DVT raise risk for exacerbations or recurrent clot  External records from outside source obtained and reviewed including echocardiogram in July 2018 showing EF 65 to 57%, grade 2 diastolic dysfunction.  No significant valvular disease.  I ordered and personally interpreted labs.  The pertinent results include: Unremarkable BMP, troponin, no acute anemia.  Kidney function at  baseline.  I ordered imaging studies including x-ray of the chest I independently visualized and interpreted imaging which showed no focal infiltrate or abnormality I agree with the radiologist interpretation  The patient was maintained on a cardiac monitor.  I personally viewed and interpreted the cardiac monitored which showed an underlying rhythm of: Normal sinus rhythm with occasional sinus tachycardia  Per my interpretation the patient's ECG shows normal sinus rhythm no acute ischemic finding  I have reviewed the patients home medicines and have made adjustments as needed.  We can put her on 1 week of Lasix to help with additional diuresis.  We will start amlodipine for hypertension  Test Considered: With negative D-dimer have a lower suspicion for acute pulmonary embolism in the setting  After the interventions noted above, I reevaluated the patient and found that they have: stayed the same  Dispostion:  After consideration of the diagnostic results and the patients response to treatment, I feel that the patent would benefit from outpatient follow-up with PCP and cardiologist..         Final Clinical Impression(s) / ED Diagnoses Final diagnoses:  Shortness of breath  Hypertension, unspecified type    Rx / DC Orders ED Discharge Orders          Ordered    furosemide (LASIX) 20 MG tablet  Daily        07/09/21 2247    amLODipine (NORVASC) 5 MG tablet  Daily        07/09/21 2247              Wyvonnia Dusky, MD 07/09/21 2317

## 2021-07-09 NOTE — ED Triage Notes (Signed)
Patient presents to ED via POV from home. Patient reports SOB and leg swelling after a long drive. History of HTN. Patient is suppose to be on BP meds but has been over her lisinopril for over 6 months.

## 2021-08-10 ENCOUNTER — Ambulatory Visit: Payer: Self-pay | Admitting: Medical

## 2021-10-14 ENCOUNTER — Encounter: Payer: Self-pay | Admitting: Medical

## 2021-10-14 ENCOUNTER — Ambulatory Visit (INDEPENDENT_AMBULATORY_CARE_PROVIDER_SITE_OTHER): Payer: 59 | Admitting: Medical

## 2021-10-14 VITALS — BP 140/85 | HR 90 | Temp 98.2°F | Resp 18 | Ht 71.0 in | Wt 178.6 lb

## 2021-10-14 DIAGNOSIS — Z124 Encounter for screening for malignant neoplasm of cervix: Secondary | ICD-10-CM

## 2021-10-14 DIAGNOSIS — E119 Type 2 diabetes mellitus without complications: Secondary | ICD-10-CM | POA: Diagnosis not present

## 2021-10-14 DIAGNOSIS — Z1211 Encounter for screening for malignant neoplasm of colon: Secondary | ICD-10-CM

## 2021-10-14 DIAGNOSIS — Z1231 Encounter for screening mammogram for malignant neoplasm of breast: Secondary | ICD-10-CM

## 2021-10-14 DIAGNOSIS — I509 Heart failure, unspecified: Secondary | ICD-10-CM

## 2021-10-14 DIAGNOSIS — F17200 Nicotine dependence, unspecified, uncomplicated: Secondary | ICD-10-CM

## 2021-10-14 DIAGNOSIS — Z0000 Encounter for general adult medical examination without abnormal findings: Secondary | ICD-10-CM | POA: Diagnosis not present

## 2021-10-14 DIAGNOSIS — I10 Essential (primary) hypertension: Secondary | ICD-10-CM

## 2021-10-14 LAB — CBC WITH DIFFERENTIAL/PLATELET
Basophils Absolute: 0.1 10*3/uL (ref 0.0–0.1)
Basophils Relative: 0.9 % (ref 0.0–3.0)
Eosinophils Absolute: 0.2 10*3/uL (ref 0.0–0.7)
Eosinophils Relative: 3.4 % (ref 0.0–5.0)
HCT: 39 % (ref 36.0–46.0)
Hemoglobin: 12.8 g/dL (ref 12.0–15.0)
Lymphocytes Relative: 34.3 % (ref 12.0–46.0)
Lymphs Abs: 2 10*3/uL (ref 0.7–4.0)
MCHC: 32.8 g/dL (ref 30.0–36.0)
MCV: 92.5 fl (ref 78.0–100.0)
Monocytes Absolute: 0.4 10*3/uL (ref 0.1–1.0)
Monocytes Relative: 6.2 % (ref 3.0–12.0)
Neutro Abs: 3.3 10*3/uL (ref 1.4–7.7)
Neutrophils Relative %: 55.2 % (ref 43.0–77.0)
Platelets: 460 10*3/uL — ABNORMAL HIGH (ref 150.0–400.0)
RBC: 4.22 Mil/uL (ref 3.87–5.11)
RDW: 19.6 % — ABNORMAL HIGH (ref 11.5–15.5)
WBC: 5.9 10*3/uL (ref 4.0–10.5)

## 2021-10-14 LAB — COMPREHENSIVE METABOLIC PANEL
ALT: 13 U/L (ref 0–35)
AST: 15 U/L (ref 0–37)
Albumin: 4.7 g/dL (ref 3.5–5.2)
Alkaline Phosphatase: 38 U/L — ABNORMAL LOW (ref 39–117)
BUN: 8 mg/dL (ref 6–23)
CO2: 27 mEq/L (ref 19–32)
Calcium: 10.1 mg/dL (ref 8.4–10.5)
Chloride: 101 mEq/L (ref 96–112)
Creatinine, Ser: 0.77 mg/dL (ref 0.40–1.20)
GFR: 91.35 mL/min (ref >60.00)
Glucose, Bld: 110 mg/dL — ABNORMAL HIGH (ref 70–99)
Potassium: 4.5 mEq/L (ref 3.5–5.1)
Sodium: 136 mEq/L (ref 135–145)
Total Bilirubin: 0.3 mg/dL (ref 0.2–1.2)
Total Protein: 7.9 g/dL (ref 6.0–8.3)

## 2021-10-14 LAB — HEMOGLOBIN A1C: Hgb A1c MFr Bld: 6.5 % (ref 4.6–6.5)

## 2021-10-14 LAB — LIPID PANEL
Cholesterol: 306 mg/dL — ABNORMAL HIGH (ref 0–200)
HDL: 52.3 mg/dL (ref >39.00)
NonHDL: 253.74
Total CHOL/HDL Ratio: 6
Triglycerides: 269 mg/dL — ABNORMAL HIGH (ref 0.0–149.0)
VLDL: 53.8 mg/dL — ABNORMAL HIGH (ref 0.0–40.0)

## 2021-10-14 LAB — LDL CHOLESTEROL, DIRECT: Direct LDL: 168 mg/dL

## 2021-10-14 LAB — BRAIN NATRIURETIC PEPTIDE: Pro B Natriuretic peptide (BNP): 18 pg/mL (ref 0.0–100.0)

## 2021-10-14 MED ORDER — ATORVASTATIN CALCIUM 10 MG PO TABS
10.0000 mg | ORAL_TABLET | Freq: Every day | ORAL | 3 refills | Status: DC
Start: 2021-10-14 — End: 2022-11-22

## 2021-10-14 MED ORDER — METFORMIN HCL 500 MG PO TABS: 500.0000 mg | ORAL_TABLET | Freq: Two times a day (BID) | ORAL | 3 refills | Status: DC

## 2021-10-14 MED ORDER — BUSPIRONE HCL 7.5 MG PO TABS: 7.5000 mg | ORAL_TABLET | Freq: Two times a day (BID) | ORAL | 0 refills | Status: DC

## 2021-10-14 MED ORDER — LISINOPRIL 20 MG PO TABS: 20.0000 mg | ORAL_TABLET | Freq: Every day | ORAL | 3 refills | Status: DC

## 2021-10-14 NOTE — Addendum Note (Signed)
Addended by: Anabel Halon on: 10/14/2021 09:45 AM   Modules accepted: Orders

## 2021-10-14 NOTE — Progress Notes (Addendum)
Subjective:    Patient ID: Terri Morris, female    DOB: [birth year], 46 y.o.   MRN: [id number]  HPI Pt in to reestablish cares. Not seen in more than 3 years.  Pt is fasting. Decided to do wellness exam.  Needs colonoscopy, pap and mammogram  Declines flu vaccine.   Pt not working presently. Not exercising. Pack every 2 days. No alcohol. Moderate healthy but admits to daily ice cream use. No coffee. 2-3 sodas a day pepsi.  Married.   Pt has diabetes- last a1c I found was 6.7. will reheck that today. Formerly was on metformin.  Htn- on amlodipine 5 mg daily in the past. More than a month since was on med. Also had been using lisinopril   Pt is smoker. 1/2 pack a day.  Chf- history of and has been on lasix periodically/prn. Last cxr this summer.   IMPRESSION: No active cardiopulmonary disease.   Last echo. 2018  Left ventricle: The cavity size was normal. Wall thickness was    normal. Systolic function was vigorous. The estimated ejection    fraction was in the range of 65% to 70%. Features are consistent    with a pseudonormal left ventricular filling pattern, with    concomitant abnormal relaxation and increased filling pressure    (grade 2 diastolic dysfunction).  Hx of DVT in the past. Pt used xarelto last and was dc'd by hematolgist per pt. Then told to just use 2 aspirin a day.   Review of Systems  Constitutional:  Negative for chills and fatigue.  HENT:  Negative for congestion, drooling and ear discharge.   Respiratory:  Negative for cough, chest tightness, shortness of breath and wheezing.   Cardiovascular:  Negative for chest pain and palpitations.  Gastrointestinal:  Negative for abdominal distention, abdominal pain, blood in stool, diarrhea and nausea.  Genitourinary:  Negative for difficulty urinating, dysuria, enuresis, flank pain and frequency.  Musculoskeletal:  Negative for back pain and myalgias.  Skin:  Negative for rash.  Neurological:   Negative for dizziness, numbness and headaches.  Hematological:  Negative for adenopathy. Does not bruise/bleed easily.  Psychiatric/Behavioral:  Positive for dysphoric mood. Negative for behavioral problems, decreased concentration, sleep disturbance and suicidal ideas. The patient is nervous/anxious.        States anxiety much worse. Never been on buspar before    Past Medical History:  Diagnosis Date   AKI (acute kidney injury) ([place]) 07/09/2016   Anxiety    HLD (hyperlipidemia) 07/09/2016   Hypertension    Thyroid disease    Vaginal Pap smear, abnormal      Social History   Socioeconomic History   Marital status: Married    Spouse name: Not on file   Number of children: Not on file   Years of education: Not on file   Highest education level: Not on file  Occupational History   Not on file  Tobacco Use   Smoking status: Heavy Smoker    Packs/day: 0.50    Years: 24.00    Total pack years: 12.00    Types: Cigarettes   Smokeless tobacco: Never  Vaping Use   Vaping Use: Never used  Substance and Sexual Activity   Alcohol use: Yes    Alcohol/week: 3.0 standard drinks of alcohol    Types: 3 Cans of beer per week    Comment: 3 glasses of wine on weekends   Drug use: No   Sexual activity: Yes    Birth control/protection:  None  Other Topics Concern   Not on file  Social History Narrative   Not on file   Social Determinants of Health   Financial Resource Strain: Not on file  Food Insecurity: Not on file  Transportation Needs: Not on file  Physical Activity: Not on file  Stress: Not on file  Social Connections: Not on file  Intimate Partner Violence: Not on file    Past Surgical History:  Procedure Laterality Date   ECTOPIC PREGNANCY SURGERY     LEEP      Family History  Problem Relation Age of Onset   Hypertension Mother    Stroke Mother    Arthritis Mother    Alcohol abuse Mother    Diabetes Mother    Hypertension Father    Alcohol abuse Father     Diabetes Father     Allergies  Allergen Reactions   Ferrous Sulfate Other (See Comments)    Severe stomach pains and constipation    Current Outpatient Medications on File Prior to Visit  Medication Sig Dispense Refill   amLODipine (NORVASC) 5 MG tablet Take 1 tablet (5 mg total) by mouth daily for 30 doses. 30 tablet 0   aspirin EC 81 MG tablet Take 162 mg by mouth daily.     furosemide (LASIX) 20 MG tablet Take 1 tablet (20 mg total) by mouth daily for 7 days. 7 tablet 0   lisinopril (ZESTRIL) 20 MG tablet TAKE 1 TABLET BY MOUTH DAILY 30 tablet 0   metFORMIN (GLUCOPHAGE) 500 MG tablet Take 1 tablet (500 mg total) by mouth 2 (two) times daily with a meal. 60 tablet 1   ondansetron (ZOFRAN ODT) 8 MG disintegrating tablet Take 1 tablet (8 mg total) by mouth every 8 (eight) hours as needed for nausea or vomiting. (Patient not taking: Reported on 12/29/2016) 20 tablet 0   sertraline (ZOLOFT) 25 MG tablet Take 1 tablet (25 mg total) by mouth daily. For 1 week. Then increase to '50mg'$  daily. 30 tablet 0   No current facility-administered medications on file prior to visit.    BP (!) 140/85   Pulse 90   Temp 98.2 F (36.8 C)   Resp 18   Ht '5\' 11"'$  (1.803 m)   Wt 178 lb 9.6 oz (81 kg)   SpO2 100%   BMI 24.91 kg/m          Objective:   Physical Exam  General Mental Status- Alert. General Appearance- Not in acute distress.   Skin General: Color- Normal Color. Moisture- Normal Moisture.  Neck Carotid Arteries- Normal color. Moisture- Normal Moisture. No carotid bruits. No JVD.  Chest and Lung Exam Auscultation: Breath Sounds:-Normal.  Cardiovascular Auscultation:Rythm- Regular. Murmurs & Other Heart Sounds:Auscultation of the heart reveals- No Murmurs.  Abdomen Inspection:-Inspeection Normal. Palpation/Percussion:Note:No mass. Palpation and Percussion of the abdomen reveal- Non Tender, Non Distended + BS, no rebound or guarding.   Neurologic Cranial Nerve exam:- CN  III-XII intact(No nystagmus), symmetric smile. Strength:- 5/5 equal and symmetric strength both upper and lower extremities.       Assessment & Plan:   Patient Instructions  For you wellness exam today I have ordered cbc, cmp and lipid panel.  Vaccines declined today.  Recommend exercise and healthy diet.  We will let you know lab results as they come in.  Follow up date appointment will be determined after lab review.    Refer for pap, mammogram and colonoscopy today.  For diabetes get a1c today.  For  htn- restart lisinopril 20 mg daily. With bp level on recheck today not clear if you need amlodipine. Check bp daily ad update me on readings in one week.  Smoking cessation advised.  Chf- by exam today do not appear to be in flare. No swelling of legs. No dyspnea. Will add bnp to lab due to hx.    Mackie Pai, PA-C

## 2021-10-14 NOTE — Addendum Note (Signed)
Addended by: Anabel Halon on: 10/14/2021 07:31 PM   Modules accepted: Orders

## 2021-10-14 NOTE — Patient Instructions (Addendum)
For you wellness exam today I have ordered cbc, cmp and lipid panel.  Vaccines declined today.  Recommend exercise and healthy diet.  We will let you know lab results as they come in.  Follow up date appointment will be determined after lab review.    Refer for pap, mammogram and colonoscopy today.  For diabetes get a1c today.  For htn- restart lisinopril 20 mg daily. With bp level on recheck today not clear if you need amlodipine. Check bp daily ad update me on readings in one week.  Smoking cessation advised.  Chf- by exam today do not appear to be in flare. No swelling of legs. No dyspnea. Will add bnp to lab due to hx.  For anxiety will rx buspar. Milder depression. May add sertraline.  Preventive Care 33-48 Years Old, Female Preventive care refers to lifestyle choices and visits with your health care provider that can promote health and wellness. Preventive care visits are also called wellness exams. What can I expect for my preventive care visit? Counseling Your health care provider may ask you questions about your: Medical history, including: Past medical problems. Family medical history. Pregnancy history. Current health, including: Menstrual cycle. Method of birth control. Emotional well-being. Home life and relationship well-being. Sexual activity and sexual health. Lifestyle, including: Alcohol, nicotine or tobacco, and drug use. Access to firearms. Diet, exercise, and sleep habits. Work and work Statistician. Sunscreen use. Safety issues such as seatbelt and bike helmet use. Physical exam Your health care provider will check your: Height and weight. These may be used to calculate your BMI (body mass index). BMI is a measurement that tells if you are at a healthy weight. Waist circumference. This measures the distance around your waistline. This measurement also tells if you are at a healthy weight and may help predict your risk of certain diseases, such as  type 2 diabetes and high blood pressure. Heart rate and blood pressure. Body temperature. Skin for abnormal spots. What immunizations do I need?  Vaccines are usually given at various ages, according to a schedule. Your health care provider will recommend vaccines for you based on your age, medical history, and lifestyle or other factors, such as travel or where you work. What tests do I need? Screening Your health care provider may recommend screening tests for certain conditions. This may include: Lipid and cholesterol levels. Diabetes screening. This is done by checking your blood sugar (glucose) after you have not eaten for a while (fasting). Pelvic exam and Pap test. Hepatitis B test. Hepatitis C test. HIV (human immunodeficiency virus) test. STI (sexually transmitted infection) testing, if you are at risk. Lung cancer screening. Colorectal cancer screening. Mammogram. Talk with your health care provider about when you should start having regular mammograms. This may depend on whether you have a family history of breast cancer. BRCA-related cancer screening. This may be done if you have a family history of breast, ovarian, tubal, or peritoneal cancers. Bone density scan. This is done to screen for osteoporosis. Talk with your health care provider about your test results, treatment options, and if necessary, the need for more tests. Follow these instructions at home: Eating and drinking  Eat a diet that includes fresh fruits and vegetables, whole grains, lean protein, and low-fat dairy products. Take vitamin and mineral supplements as recommended by your health care provider. Do not drink alcohol if: Your health care provider tells you not to drink. You are pregnant, may be pregnant, or are planning to become pregnant.  If you drink alcohol: Limit how much you have to 0-1 drink a day. Know how much alcohol is in your drink. In the [place] one drink equals one 12 oz bottle of beer  (355 mL), one 5 oz glass of wine (148 mL), or one 1 oz glass of hard liquor (44 mL). Lifestyle Brush your teeth every morning and night with fluoride toothpaste. Floss one time each day. Exercise for at least 30 minutes 5 or more days each week. Do not use any products that contain nicotine or tobacco. These products include cigarettes, chewing tobacco, and vaping devices, such as e-cigarettes. If you need help quitting, ask your health care provider. Do not use drugs. If you are sexually active, practice safe sex. Use a condom or other form of protection to prevent STIs. If you do not wish to become pregnant, use a form of birth control. If you plan to become pregnant, see your health care provider for a prepregnancy visit. Take aspirin only as told by your health care provider. Make sure that you understand how much to take and what form to take. Work with your health care provider to find out whether it is safe and beneficial for you to take aspirin daily. Find healthy ways to manage stress, such as: Meditation, yoga, or listening to music. Journaling. Talking to a trusted person. Spending time with friends and family. Minimize exposure to UV radiation to reduce your risk of skin cancer. Safety Always wear your seat belt while driving or riding in a vehicle. Do not drive: If you have been drinking alcohol. Do not ride with someone who has been drinking. When you are tired or distracted. While texting. If you have been using any mind-altering substances or drugs. Wear a helmet and other protective equipment during sports activities. If you have firearms in your house, make sure you follow all gun safety procedures. Seek help if you have been physically or sexually abused. What's next? Visit your health care provider once a year for an annual wellness visit. Ask your health care provider how often you should have your eyes and teeth checked. Stay up to date on all vaccines. This  information is not intended to replace advice given to you by your health care provider. Make sure you discuss any questions you have with your health care provider. Document Revised: 06/24/2020 Document Reviewed: 06/24/2020 Elsevier Patient Education  [place].

## 2021-11-02 ENCOUNTER — Other Ambulatory Visit (HOSPITAL_COMMUNITY)
Admission: RE | Admit: 2021-11-02 | Discharge: 2021-11-02 | Disposition: A | Discharge status: Home / Self Care | Payer: 59 | Source: Ambulatory Visit | Attending: Obstetrics and Gynecology | Admitting: Obstetrics and Gynecology

## 2021-11-02 ENCOUNTER — Encounter: Payer: Self-pay | Admitting: Obstetrics and Gynecology

## 2021-11-02 ENCOUNTER — Ambulatory Visit (INDEPENDENT_AMBULATORY_CARE_PROVIDER_SITE_OTHER): Payer: 59 | Admitting: Obstetrics and Gynecology

## 2021-11-02 VITALS — BP 144/91 | HR 105 | Ht 71.0 in | Wt 171.0 lb

## 2021-11-02 DIAGNOSIS — Z01419 Encounter for gynecological examination (general) (routine) without abnormal findings: Secondary | ICD-10-CM

## 2021-11-02 NOTE — Progress Notes (Signed)
GYNECOLOGY ANNUAL PREVENTATIVE CARE ENCOUNTER NOTE  History:     Terri Morris is a 46 y.o. G43P1030 female here for a routine annual gynecologic exam.  Current complaints: abnormal menstrual cycle.   Denies abnormal vaginal bleeding, discharge, pelvic pain, problems with intercourse or other gynecologic concerns.   March 1-31 was last period.  Some spotting in July and Oct.  Mom went through menopause in her 7's.  Has PCP and see's regularly.  Was previously treated for HTN and stopped her medication. She is now taking Lisinopril 20 mg daily  Gynecologic History Patient's last menstrual period was 03/10/2021. Contraception: none Last Pap: 2018. Result were abnormal    Obstetric History OB History  Gravida Para Term Preterm AB Living  '4 1 1   3    '$ SAB IAB Ectopic Multiple Live Births      3   1    # Outcome Date GA Lbr Len/2nd Weight Sex Delivery Anes PTL Lv  4 Term      Vag-Spont     3 Ectopic           2 Ectopic           1 Ectopic             Past Medical History:  Diagnosis Date   AKI (acute kidney injury) ([place]) 07/09/2016   Anxiety    HLD (hyperlipidemia) 07/09/2016   Hypertension    Thyroid disease    Vaginal Pap smear, abnormal     Past Surgical History:  Procedure Laterality Date   ECTOPIC PREGNANCY SURGERY     LEEP      Current Outpatient Medications on File Prior to Visit  Medication Sig Dispense Refill   aspirin EC 81 MG tablet Take 162 mg by mouth daily.     atorvastatin (LIPITOR) 10 MG tablet Take 1 tablet (10 mg total) by mouth daily. 90 tablet 3   busPIRone (BUSPAR) 7.5 MG tablet Take 1 tablet (7.5 mg total) by mouth 2 (two) times daily. 60 tablet 0   lisinopril (ZESTRIL) 20 MG tablet TAKE 1 TABLET BY MOUTH DAILY 30 tablet 0   lisinopril (ZESTRIL) 20 MG tablet Take 1 tablet (20 mg total) by mouth daily. 90 tablet 3   metFORMIN (GLUCOPHAGE) 500 MG tablet Take 1 tablet (500 mg total) by mouth 2 (two) times daily with a meal. 180 tablet 3    No current facility-administered medications on file prior to visit.    Allergies  Allergen Reactions   Ferrous Sulfate Other (See Comments)    Severe stomach pains and constipation    Social History:  reports that she has been smoking cigarettes. She has a 12.00 pack-year smoking history. She has never used smokeless tobacco. She reports current alcohol use of about 3.0 standard drinks of alcohol per week. She reports that she does not use drugs.  Family History  Problem Relation Age of Onset   Hypertension Mother    Stroke Mother    Arthritis Mother    Alcohol abuse Mother    Diabetes Mother    Hypertension Father    Alcohol abuse Father    Diabetes Father     The following portions of the patient's history were reviewed and updated as appropriate: allergies, current medications, past family history, past medical history, past social history, past surgical history and problem list.  Review of Systems Pertinent items noted in HPI and remainder of comprehensive ROS otherwise negative.  Physical Exam:  BP (!) 144/91   Pulse (!) 105   Ht '5\' 11"'$  (1.803 m)   Wt 171 lb (77.6 kg)   LMP 03/10/2021   BMI 23.85 kg/m  CONSTITUTIONAL: Well-developed, well-nourished female in no acute distress.  HENT:  Normocephalic, atraumatic, External right and left ear normal.  EYES: Conjunctivae and EOM are normal. Pupils are equal, round, and reactive to light. No scleral icterus.  NECK: Normal range of motion, supple, no masses.  Normal thyroid.  SKIN: Skin is warm and dry. No rash noted. Not diaphoretic. No erythema. No pallor. MUSCULOSKELETAL: Normal range of motion. No tenderness.  No cyanosis, clubbing, or edema. NEUROLOGIC: Alert and oriented to person, place, and time. Normal reflexes, muscle tone coordination.  PSYCHIATRIC: Normal mood and affect. Normal behavior. Normal judgment and thought content. CARDIOVASCULAR: Normal heart rate noted, regular rhythm RESPIRATORY: Clear to  auscultation bilaterally. Effort and breath sounds normal, no problems with respiration noted. BREASTS: Symmetric in size. No masses, tenderness, skin changes, nipple drainage, or lymphadenopathy bilaterally. Performed in the presence of a chaperone. ABDOMEN: Soft, no distention noted.  No tenderness, rebound or guarding.  PELVIC: Normal appearing external genitalia and urethral meatus; normal appearing vaginal mucosa and cervix.  No abnormal vaginal discharge noted.  Pap smear obtained.  Normal uterine size, no other palpable masses, no uterine or adnexal tenderness.  Performed in the presence of a chaperone.   Assessment and Plan:    1. Well woman exam with routine gynecological exam   - Cytology - PAP( Box)  -  Mammo scheduled.  - yearly labs done with PCP - Recheck BP more reassuring.    Will follow up results of pap smear and manage accordingly. Mammogram scheduled   Rhonda Vangieson, Artist Pais, [place] for [employer], [place]

## 2021-11-03 ENCOUNTER — Ambulatory Visit (HOSPITAL_BASED_OUTPATIENT_CLINIC_OR_DEPARTMENT_OTHER)
Admission: RE | Admit: 2021-11-03 | Discharge: 2021-11-03 | Disposition: A | Discharge status: Home / Self Care | Payer: 59 | Source: Ambulatory Visit | Attending: Medical | Admitting: Medical

## 2021-11-03 ENCOUNTER — Encounter (HOSPITAL_BASED_OUTPATIENT_CLINIC_OR_DEPARTMENT_OTHER): Payer: Self-pay

## 2021-11-03 DIAGNOSIS — Z1231 Encounter for screening mammogram for malignant neoplasm of breast: Secondary | ICD-10-CM | POA: Diagnosis present

## 2021-11-03 LAB — CYTOLOGY - PAP
Adequacy: ABSENT
Comment: NEGATIVE
Diagnosis: NEGATIVE
High risk HPV: NEGATIVE

## 2021-11-05 ENCOUNTER — Other Ambulatory Visit: Payer: Self-pay | Admitting: Medical

## 2021-11-05 DIAGNOSIS — R928 Other abnormal and inconclusive findings on diagnostic imaging of breast: Secondary | ICD-10-CM

## 2021-11-16 ENCOUNTER — Other Ambulatory Visit: Payer: 59

## 2021-12-06 ENCOUNTER — Other Ambulatory Visit: Payer: Self-pay | Admitting: Medical

## 2021-12-06 ENCOUNTER — Ambulatory Visit
Admission: RE | Admit: 2021-12-06 | Discharge: 2021-12-06 | Disposition: A | Discharge status: Home / Self Care | Payer: 59 | Source: Ambulatory Visit | Attending: Medical | Admitting: Medical

## 2021-12-06 DIAGNOSIS — N6310 Unspecified lump in the right breast, unspecified quadrant: Secondary | ICD-10-CM

## 2021-12-06 DIAGNOSIS — R928 Other abnormal and inconclusive findings on diagnostic imaging of breast: Secondary | ICD-10-CM

## 2021-12-11 ENCOUNTER — Other Ambulatory Visit: Payer: Self-pay | Admitting: Medical

## 2021-12-14 ENCOUNTER — Ambulatory Visit
Admission: RE | Admit: 2021-12-14 | Discharge: 2021-12-14 | Disposition: A | Discharge status: Home / Self Care | Payer: 59 | Source: Ambulatory Visit | Attending: Medical | Admitting: Medical

## 2021-12-14 DIAGNOSIS — R928 Other abnormal and inconclusive findings on diagnostic imaging of breast: Secondary | ICD-10-CM

## 2021-12-14 HISTORY — PX: BREAST BIOPSY: SHX20

## 2021-12-15 ENCOUNTER — Other Ambulatory Visit: Payer: Self-pay | Admitting: Medical

## 2021-12-15 DIAGNOSIS — N6310 Unspecified lump in the right breast, unspecified quadrant: Secondary | ICD-10-CM

## 2022-01-17 ENCOUNTER — Ambulatory Visit
Admission: RE | Admit: 2022-01-17 | Discharge: 2022-01-17 | Disposition: A | Discharge status: Home / Self Care | Payer: 59 | Source: Ambulatory Visit | Attending: Medical | Admitting: Medical

## 2022-01-17 DIAGNOSIS — N6310 Unspecified lump in the right breast, unspecified quadrant: Secondary | ICD-10-CM

## 2022-01-17 HISTORY — PX: BREAST BIOPSY: SHX20

## 2022-11-22 ENCOUNTER — Other Ambulatory Visit: Payer: Self-pay | Admitting: Medical

## 2024-02-15 ENCOUNTER — Other Ambulatory Visit: Payer: Self-pay | Admitting: Medical
